# Patient Record
Sex: Female | Born: 1955
Health system: Southern US, Community
[De-identification: ages and names within clinical notes are randomized; demographics above are authoritative.]

## PROBLEM LIST (undated history)

## (undated) DIAGNOSIS — I10 Essential (primary) hypertension: Secondary | ICD-10-CM

## (undated) DIAGNOSIS — I639 Cerebral infarction, unspecified: Secondary | ICD-10-CM

## (undated) HISTORY — DX: Cerebral infarction, unspecified: I63.9

## (undated) HISTORY — DX: Essential (primary) hypertension: I10

---

## 1997-10-17 ENCOUNTER — Ambulatory Visit (HOSPITAL_COMMUNITY): Admission: RE | Admit: 1997-10-17 | Discharge: 1997-10-17 | Payer: Self-pay | Admitting: Obstetrics and Gynecology

## 2000-08-13 ENCOUNTER — Other Ambulatory Visit: Admission: RE | Admit: 2000-08-13 | Discharge: 2000-08-13 | Payer: Self-pay | Admitting: Obstetrics and Gynecology

## 2003-01-17 ENCOUNTER — Other Ambulatory Visit: Admission: RE | Admit: 2003-01-17 | Discharge: 2003-01-17 | Payer: Self-pay | Admitting: Obstetrics and Gynecology

## 2004-12-17 ENCOUNTER — Other Ambulatory Visit: Admission: RE | Admit: 2004-12-17 | Discharge: 2004-12-17 | Payer: Self-pay | Admitting: Obstetrics and Gynecology

## 2007-08-29 ENCOUNTER — Emergency Department (HOSPITAL_COMMUNITY): Admission: EM | Admit: 2007-08-29 | Discharge: 2007-08-29 | Payer: Self-pay | Admitting: Emergency Medicine

## 2010-11-22 LAB — URINALYSIS, ROUTINE W REFLEX MICROSCOPIC
Bilirubin Urine: NEGATIVE
Protein, ur: NEGATIVE
Urobilinogen, UA: 0.2

## 2010-11-22 LAB — DIFFERENTIAL
Basophils Absolute: 0.1
Basophils Relative: 1
Eosinophils Absolute: 0.2
Eosinophils Relative: 2
Lymphocytes Relative: 28
Lymphs Abs: 3.1
Monocytes Absolute: 1.1 — ABNORMAL HIGH
Monocytes Relative: 9
Neutro Abs: 6.7
Neutrophils Relative %: 60

## 2010-11-22 LAB — BASIC METABOLIC PANEL
CO2: 24
Chloride: 107
GFR calc Af Amer: >60
Potassium: 3.7
Sodium: 139

## 2010-11-22 LAB — CBC
HCT: 39.7
Hemoglobin: 13.7
MCHC: 34.4
MCV: 88.8
Platelets: 272
RBC: 4.47
RDW: 13.5
WBC: 11.2 — ABNORMAL HIGH

## 2017-03-11 DIAGNOSIS — H25813 Combined forms of age-related cataract, bilateral: Secondary | ICD-10-CM | POA: Diagnosis not present

## 2017-03-27 DIAGNOSIS — H40013 Open angle with borderline findings, low risk, bilateral: Secondary | ICD-10-CM | POA: Diagnosis not present

## 2017-03-27 DIAGNOSIS — H25813 Combined forms of age-related cataract, bilateral: Secondary | ICD-10-CM | POA: Diagnosis not present

## 2017-04-21 DIAGNOSIS — H2512 Age-related nuclear cataract, left eye: Secondary | ICD-10-CM | POA: Diagnosis not present

## 2017-05-07 DIAGNOSIS — H2512 Age-related nuclear cataract, left eye: Secondary | ICD-10-CM | POA: Diagnosis not present

## 2017-05-07 DIAGNOSIS — H25812 Combined forms of age-related cataract, left eye: Secondary | ICD-10-CM | POA: Diagnosis not present

## 2017-05-14 DIAGNOSIS — H2511 Age-related nuclear cataract, right eye: Secondary | ICD-10-CM | POA: Diagnosis not present

## 2017-05-19 DIAGNOSIS — H2511 Age-related nuclear cataract, right eye: Secondary | ICD-10-CM | POA: Diagnosis not present

## 2017-05-19 DIAGNOSIS — H25811 Combined forms of age-related cataract, right eye: Secondary | ICD-10-CM | POA: Diagnosis not present

## 2017-10-30 DIAGNOSIS — H40013 Open angle with borderline findings, low risk, bilateral: Secondary | ICD-10-CM | POA: Diagnosis not present

## 2017-10-30 DIAGNOSIS — H26492 Other secondary cataract, left eye: Secondary | ICD-10-CM | POA: Diagnosis not present

## 2017-11-08 DIAGNOSIS — I10 Essential (primary) hypertension: Secondary | ICD-10-CM | POA: Diagnosis not present

## 2017-11-08 DIAGNOSIS — G43909 Migraine, unspecified, not intractable, without status migrainosus: Secondary | ICD-10-CM | POA: Diagnosis not present

## 2017-11-09 ENCOUNTER — Emergency Department (HOSPITAL_COMMUNITY)
Admission: EM | Admit: 2017-11-09 | Discharge: 2017-11-09 | Disposition: A | Discharge status: Home / Self Care | Payer: 59 | Attending: Emergency Medicine | Admitting: Emergency Medicine

## 2017-11-09 ENCOUNTER — Emergency Department (HOSPITAL_COMMUNITY): Payer: 59

## 2017-11-09 ENCOUNTER — Encounter (HOSPITAL_COMMUNITY): Payer: Self-pay | Admitting: Emergency Medicine

## 2017-11-09 DIAGNOSIS — R51 Headache: Secondary | ICD-10-CM | POA: Diagnosis not present

## 2017-11-09 DIAGNOSIS — R519 Headache, unspecified: Secondary | ICD-10-CM

## 2017-11-09 DIAGNOSIS — G43909 Migraine, unspecified, not intractable, without status migrainosus: Secondary | ICD-10-CM | POA: Diagnosis not present

## 2017-11-09 DIAGNOSIS — I10 Essential (primary) hypertension: Secondary | ICD-10-CM

## 2017-11-09 DIAGNOSIS — F17210 Nicotine dependence, cigarettes, uncomplicated: Secondary | ICD-10-CM | POA: Diagnosis not present

## 2017-11-09 LAB — CBC WITH DIFFERENTIAL/PLATELET
BASOS PCT: 0 %
Basophils Absolute: 0 10*3/uL (ref 0.0–0.1)
EOS PCT: 0 %
Eosinophils Absolute: 0 10*3/uL (ref 0.0–0.7)
HEMATOCRIT: 47 % — AB (ref 36.0–46.0)
Hemoglobin: 16.2 g/dL — ABNORMAL HIGH (ref 12.0–15.0)
Lymphocytes Relative: 15 %
Lymphs Abs: 2 10*3/uL (ref 0.7–4.0)
MCH: 30.7 pg (ref 26.0–34.0)
MCHC: 34.5 g/dL (ref 30.0–36.0)
MCV: 89 fL (ref 78.0–100.0)
MONO ABS: 1.4 10*3/uL — AB (ref 0.1–1.0)
MONOS PCT: 11 %
Neutro Abs: 10 10*3/uL — ABNORMAL HIGH (ref 1.7–7.7)
Neutrophils Relative %: 74 %
Platelets: 330 10*3/uL (ref 150–400)
RBC: 5.28 MIL/uL — ABNORMAL HIGH (ref 3.87–5.11)
RDW: 13.3 % (ref 11.5–15.5)
WBC: 13.4 10*3/uL — ABNORMAL HIGH (ref 4.0–10.5)

## 2017-11-09 LAB — BASIC METABOLIC PANEL
Anion gap: 13 (ref 5–15)
BUN: 21 mg/dL (ref 8–23)
CALCIUM: 9 mg/dL (ref 8.9–10.3)
CHLORIDE: 99 mmol/L (ref 98–111)
CO2: 29 mmol/L (ref 22–32)
CREATININE: 0.73 mg/dL (ref 0.44–1.00)
GFR calc non Af Amer: >60 mL/min (ref >60)
Glucose, Bld: 85 mg/dL (ref 70–99)
POTASSIUM: 3.1 mmol/L — AB (ref 3.5–5.1)
SODIUM: 141 mmol/L (ref 135–145)

## 2017-11-09 MED ORDER — PROCHLORPERAZINE EDISYLATE 10 MG/2ML IJ SOLN
10.0000 mg | Freq: Once | INTRAMUSCULAR | Status: AC
Start: 2017-11-09 — End: 2017-11-09
  Administered 2017-11-09: 10 mg via INTRAVENOUS
  Filled 2017-11-09: qty 2

## 2017-11-09 MED ORDER — KETOROLAC TROMETHAMINE 30 MG/ML IJ SOLN
15.0000 mg | Freq: Once | INTRAMUSCULAR | Status: AC
  Administered 2017-11-09: 15 mg via INTRAVENOUS
  Filled 2017-11-09: qty 1

## 2017-11-09 MED ORDER — DIPHENHYDRAMINE HCL 50 MG/ML IJ SOLN
25.0000 mg | Freq: Once | INTRAMUSCULAR | Status: AC
  Administered 2017-11-09: 25 mg via INTRAVENOUS
  Filled 2017-11-09: qty 1

## 2017-11-09 MED ORDER — HYDRALAZINE HCL 10 MG PO TABS
10.0000 mg | ORAL_TABLET | Freq: Once | ORAL | Status: AC
  Administered 2017-11-09: 10 mg via ORAL
  Filled 2017-11-09: qty 1

## 2017-11-09 MED ORDER — SODIUM CHLORIDE 0.9 % IV BOLUS
1000.0000 mL | Freq: Once | INTRAVENOUS | Status: AC
  Administered 2017-11-09: 1000 mL via INTRAVENOUS

## 2017-11-09 NOTE — ED Triage Notes (Signed)
Pt c/o headache  / migraine x 4 days. Pt with photosensitivity and pain worsening x 4 days.

## 2017-11-09 NOTE — ED Provider Notes (Signed)
Care assumed from Martinique Russo, PA-C at shift change with re-evaluation pending.   In brief, this patient is a 62 y.o. F who presents for evaluation of right sided headache. Please see note from previous provider for full history/physical.  Patient with negative head CT.  Labs unremarkable.  No evidence for hypertensive emergency at this time.  Patient had improvement in headache with migraine cocktail and had some returning to headache and felt like she cannot leave yet.  Patient was given additional analgesics by previous provider.  PLAN: Plan to dispel home once patient feels better.  Previous provider discussed with attending who recommends PCP follow up to manage her BP. No acute intervention needed here in the ED.   MDM:  Re-evaluation.  Patient states that headache is improved and she is ready to go.  She is still hypertensive.  Will give dose of hydralazine.  Patient states that she is ready to leave and does not want to stay any longer.  Encourage primary care follow-up for evaluation of blood pressure. Patient had ample opportunity for questions and discussion. All patient's questions were answered with full understanding. Strict return precautions discussed. Patient expresses understanding and agreement to plan.    1. Acute nonintractable headache, unspecified headache type       Volanda Napoleon, PA-C 11/09/17 [place], [place], DO 11/09/17 2347

## 2017-11-09 NOTE — Discharge Instructions (Addendum)
Please read instructions below. You can take 600 mg of Advil/ibuprofen every 6 hours as needed for headache. Caffeine may also help your headache. Use your insurance card to find a primary care. Schedule an appointment to manage you blood pressure.  Eat a low salt diet to aid in blood pressure management. Return to the ER for severely worsening headache, vision changes, fever, weakness or numbness, chest pain, or new or concerning symptoms.

## 2017-11-09 NOTE — ED Provider Notes (Addendum)
[facility] DEPT Provider Note   CSN: [id number] Arrival date & time: 11/09/17  0950     History   Chief Complaint Chief Complaint  Patient presents with  . Migraine    HPI TEMA ALIRE is a 62 y.o. female without significant past medical history, presenting to the ED with complaint of right-sided headache that began on Wednesday.  Patient states pain is constant and dull with associated photophobia.  Reports headache gradually worsened since Wednesday, initially accompanied with nausea and vomiting which has since subsided.  She states she has had a headache similar to this in the past that also lasted about 1 week and resolved.  Has been taking Tylenol at home without much relief of symptoms.  Denies vision changes, neck pain or stiffness, fever, unilateral weakness or numbness, facial droop or slurred speech.  No head trauma.  Does not have a PCP and has not had a wellness visit in some time.   The history is provided by the patient.    History reviewed. No pertinent past medical history.  There are no active problems to display for this patient.   History reviewed. No pertinent surgical history.   OB History   None      Home Medications    Prior to Admission medications   Medication Sig Start Date End Date Taking? Authorizing Provider  ibuprofen (ADVIL,MOTRIN) 200 MG tablet Take 600 mg by mouth every 6 (six) hours as needed for headache or moderate pain.   Yes [provider]    Family History History reviewed. No pertinent family history.  Social History Social History   Tobacco Use  . Smoking status: Current Every Day Smoker    Packs/day: 0.50    Types: Cigarettes  . Smokeless tobacco: Never Used  Substance Use Topics  . Alcohol use: Never    Frequency: Never  . Drug use: Never     Allergies   Patient has no known allergies.   Review of Systems Review of Systems  Constitutional: Negative for fever.  Eyes:  Positive for photophobia. Negative for visual disturbance.  Musculoskeletal: Negative for neck pain and neck stiffness.  Neurological: Positive for headaches. Negative for facial asymmetry, speech difficulty, weakness and numbness.  All other systems reviewed and are negative.    Physical Exam Updated Vital Signs BP (!) 206/71   Pulse 61   Temp 97.7 F (36.5 C) (Oral)   Resp 16   SpO2 100%   Physical Exam  Constitutional: She is oriented to person, place, and time. She appears well-developed and well-nourished. No distress.  HENT:  Head: Normocephalic and atraumatic.  Eyes: Conjunctivae are normal.  Neck: Normal range of motion. Neck supple.  No nuchal rigidity  Cardiovascular: Normal rate, regular rhythm and normal heart sounds.  Pulmonary/Chest: Effort normal and breath sounds normal. No respiratory distress.  Abdominal: Soft. Bowel sounds are normal. She exhibits no distension. There is no tenderness.  Neurological: She is alert and oriented to person, place, and time.  Mental Status:  Alert, oriented, thought content appropriate, able to give a coherent history. Speech fluent without evidence of aphasia. Able to follow 2 step commands without difficulty.  Cranial Nerves:  II:  Peripheral visual fields grossly normal, pupils equal, round, reactive to light III,IV, VI: ptosis not present, extra-ocular motions intact bilaterally  V,VII: smile symmetric, facial light touch sensation equal VIII: hearing grossly normal to voice  X: uvula elevates symmetrically  XI: bilateral shoulder shrug symmetric and strong  XII: midline tongue extension without fassiculations Motor:  Normal tone. 5/5 in upper and lower extremities bilaterally including strong and equal grip strength and dorsiflexion/plantar flexion Sensory: Pinprick and light touch normal in all extremities.  Deep Tendon Reflexes: 2+ and symmetric in the biceps and patella Cerebellar: normal finger-to-nose with bilateral  upper extremities No pronator drift Gait: normal gait and balance CV: distal pulses palpable throughout    Skin: Skin is warm.  Psychiatric: She has a normal mood and affect. Her behavior is normal.  Nursing note and vitals reviewed.    ED Treatments / Results  Labs (all labs ordered are listed, but only abnormal results are displayed) Labs Reviewed  BASIC METABOLIC PANEL - Abnormal; Notable for the following components:      Result Value   Potassium 3.1 (*)    All other components within normal limits  CBC WITH DIFFERENTIAL/PLATELET - Abnormal; Notable for the following components:   WBC 13.4 (*)    RBC 5.28 (*)    Hemoglobin 16.2 (*)    HCT 47.0 (*)    Neutro Abs 10.0 (*)    Monocytes Absolute 1.4 (*)    All other components within normal limits    EKG None  Radiology Ct Head Wo Contrast  Result Date: 11/09/2017 CLINICAL DATA:  Headache/migraine for days. Photosensitivity with worsening pain. EXAM: CT HEAD WITHOUT CONTRAST TECHNIQUE: Contiguous axial images were obtained from the base of the skull through the vertex without intravenous contrast. COMPARISON:  None. FINDINGS: Brain: Ventricles are normal in size, ventricles, cisterns and other CSF spaces are normal. There is chronic ischemic microvascular disease. There is an old lacunar infarct over the head of the right caudate nucleus. Small old lacune infarct over the left thalamus. No mass, mass effect, shift of midline structures or acute hemorrhage. No evidence of acute infarction. Vascular: No hyperdense vessel or unexpected calcification. Skull: Normal. Negative for fracture or focal lesion. Sinuses/Orbits: No acute finding. Other: None. IMPRESSION: No acute findings. Chronic ischemic microvascular disease with old small bilateral central lacunar infarcts. Electronically Signed   By: Marin Olp M.D.   On: 11/09/2017 14:31    Procedures Procedures (including critical care time)  Medications Ordered in  ED Medications  sodium chloride 0.9 % bolus 1,000 mL (0 mLs Intravenous Stopped 11/09/17 1359)  ketorolac (TORADOL) 30 MG/ML injection 15 mg (15 mg Intravenous Given 11/09/17 1241)  prochlorperazine (COMPAZINE) injection 10 mg (10 mg Intravenous Given 11/09/17 1241)  diphenhydrAMINE (BENADRYL) injection 25 mg (25 mg Intravenous Given 11/09/17 1241)  ketorolac (TORADOL) 30 MG/ML injection 15 mg (15 mg Intravenous Given 11/09/17 1548)     Initial Impression / Assessment and Plan / ED Course  I have reviewed the triage vital signs and the nursing notes.  Pertinent labs & imaging results that were available during my care of the patient were reviewed by me and considered in my medical decision making (see chart for details).  Patient presenting with headache x4 days, history of a similar episode in the past.  Normal neurologic exam.  Patient is hypertensive on initial evaluation.  Plan to treat headache and repeat vital signs.  If patient remains hypertensive and/or with pain, will consider CT.  On reevaluation, patient reports improvement in pain though patient remains hypertensive with some increase in blood pressure.  For this reason we will order CT scan to rule out acute intracranial cause of headache.  Will also order labs.  Patient discussed with Dr. Eulis Foster, who agrees with plan.  Labs  with mild leukocytosis of 13.4.  Mild hypokalemia.  CT scan is negative for acute pathology though showing some old infarcts. No evidence of hypertensive urgency. Suspect migraine vs cluster HA.   Clinical Course as of Nov 10 1599  Sun Nov 09, 2017  1543 Patient reevaluated and states headache is worsening once again.  Will order another 15 mg of Toradol and trial high flow oxygen as this may be a cluster headache.  Discussed results and encouraged PCP follow-up for blood pressure management.   [JR]    Clinical Course User Index [JR] Conswella Bruney, [place] N, PA-C   Care assumed by [facility] PA at shift change pending  re-evaluation. Likely discharge with instructions to f/u with PCP and modify diet to manage BP.  Pt discussed with Dr. Eulis Foster who guided treatment and agrees with care plan for discharge w PCP follow up.  Discussed results, findings, treatment and follow up. Patient advised of return precautions. Patient verbalized understanding and agreed with plan.  Final Clinical Impressions(s) / ED Diagnoses   Final diagnoses:  Acute nonintractable headache, unspecified headache type    ED Discharge Orders    None       Sabien Umland, [place] N, PA-C 11/09/17 1601    Orvetta Danielski, [place] N, PA-C 11/09/17 1604    Dontrell Stuck, [place] N, [place] 11/09/17 1611    Daleen Bo, MD 11/10/17 1626

## 2017-11-16 DIAGNOSIS — H6980 Other specified disorders of Eustachian tube, unspecified ear: Secondary | ICD-10-CM | POA: Diagnosis not present

## 2017-11-26 ENCOUNTER — Emergency Department (HOSPITAL_COMMUNITY): Payer: 59

## 2017-11-26 ENCOUNTER — Encounter (HOSPITAL_COMMUNITY): Payer: Self-pay

## 2017-11-26 ENCOUNTER — Inpatient Hospital Stay (HOSPITAL_COMMUNITY)
Admission: EM | Admit: 2017-11-26 | Discharge: 2017-12-26 | DRG: 021 | Disposition: A | Discharge status: Rehabilitation Facility | Payer: 59 | Attending: Neurosurgery | Admitting: Neurosurgery

## 2017-11-26 DIAGNOSIS — N390 Urinary tract infection, site not specified: Secondary | ICD-10-CM | POA: Diagnosis not present

## 2017-11-26 DIAGNOSIS — R269 Unspecified abnormalities of gait and mobility: Secondary | ICD-10-CM | POA: Diagnosis not present

## 2017-11-26 DIAGNOSIS — I609 Nontraumatic subarachnoid hemorrhage, unspecified: Secondary | ICD-10-CM

## 2017-11-26 DIAGNOSIS — R339 Retention of urine, unspecified: Secondary | ICD-10-CM | POA: Diagnosis not present

## 2017-11-26 DIAGNOSIS — G911 Obstructive hydrocephalus: Secondary | ICD-10-CM | POA: Diagnosis present

## 2017-11-26 DIAGNOSIS — I608 Other nontraumatic subarachnoid hemorrhage: Secondary | ICD-10-CM | POA: Diagnosis present

## 2017-11-26 DIAGNOSIS — I69319 Unspecified symptoms and signs involving cognitive functions following cerebral infarction: Secondary | ICD-10-CM | POA: Diagnosis not present

## 2017-11-26 DIAGNOSIS — R569 Unspecified convulsions: Secondary | ICD-10-CM | POA: Diagnosis present

## 2017-11-26 DIAGNOSIS — A499 Bacterial infection, unspecified: Secondary | ICD-10-CM | POA: Diagnosis not present

## 2017-11-26 DIAGNOSIS — Z298 Encounter for other specified prophylactic measures: Secondary | ICD-10-CM | POA: Diagnosis not present

## 2017-11-26 DIAGNOSIS — F17210 Nicotine dependence, cigarettes, uncomplicated: Secondary | ICD-10-CM | POA: Diagnosis present

## 2017-11-26 DIAGNOSIS — Z743 Need for continuous supervision: Secondary | ICD-10-CM | POA: Diagnosis not present

## 2017-11-26 DIAGNOSIS — E46 Unspecified protein-calorie malnutrition: Secondary | ICD-10-CM | POA: Diagnosis not present

## 2017-11-26 DIAGNOSIS — R4189 Other symptoms and signs involving cognitive functions and awareness: Secondary | ICD-10-CM | POA: Diagnosis present

## 2017-11-26 DIAGNOSIS — B965 Pseudomonas (aeruginosa) (mallei) (pseudomallei) as the cause of diseases classified elsewhere: Secondary | ICD-10-CM | POA: Diagnosis not present

## 2017-11-26 DIAGNOSIS — R404 Transient alteration of awareness: Secondary | ICD-10-CM | POA: Diagnosis not present

## 2017-11-26 DIAGNOSIS — I607 Nontraumatic subarachnoid hemorrhage from unspecified intracranial artery: Secondary | ICD-10-CM | POA: Diagnosis not present

## 2017-11-26 DIAGNOSIS — R0602 Shortness of breath: Secondary | ICD-10-CM

## 2017-11-26 DIAGNOSIS — R918 Other nonspecific abnormal finding of lung field: Secondary | ICD-10-CM | POA: Diagnosis not present

## 2017-11-26 DIAGNOSIS — I69398 Other sequelae of cerebral infarction: Secondary | ICD-10-CM | POA: Diagnosis not present

## 2017-11-26 DIAGNOSIS — R29810 Facial weakness: Secondary | ICD-10-CM | POA: Diagnosis not present

## 2017-11-26 DIAGNOSIS — R4020 Unspecified coma: Secondary | ICD-10-CM | POA: Diagnosis not present

## 2017-11-26 DIAGNOSIS — I6523 Occlusion and stenosis of bilateral carotid arteries: Secondary | ICD-10-CM | POA: Diagnosis not present

## 2017-11-26 DIAGNOSIS — I69019 Unspecified symptoms and signs involving cognitive functions following nontraumatic subarachnoid hemorrhage: Secondary | ICD-10-CM | POA: Diagnosis not present

## 2017-11-26 DIAGNOSIS — I10 Essential (primary) hypertension: Secondary | ICD-10-CM | POA: Diagnosis present

## 2017-11-26 DIAGNOSIS — I671 Cerebral aneurysm, nonruptured: Secondary | ICD-10-CM | POA: Diagnosis not present

## 2017-11-26 DIAGNOSIS — I629 Nontraumatic intracranial hemorrhage, unspecified: Secondary | ICD-10-CM | POA: Diagnosis not present

## 2017-11-26 DIAGNOSIS — G919 Hydrocephalus, unspecified: Secondary | ICD-10-CM | POA: Diagnosis not present

## 2017-11-26 DIAGNOSIS — Z79899 Other long term (current) drug therapy: Secondary | ICD-10-CM | POA: Diagnosis not present

## 2017-11-26 DIAGNOSIS — G40909 Epilepsy, unspecified, not intractable, without status epilepticus: Secondary | ICD-10-CM | POA: Diagnosis not present

## 2017-11-26 DIAGNOSIS — R509 Fever, unspecified: Secondary | ICD-10-CM

## 2017-11-26 DIAGNOSIS — R197 Diarrhea, unspecified: Secondary | ICD-10-CM | POA: Diagnosis not present

## 2017-11-26 LAB — COMPREHENSIVE METABOLIC PANEL
ALK PHOS: 72 U/L (ref 38–126)
ALT: 15 U/L (ref 0–44)
AST: 24 U/L (ref 15–41)
Albumin: 3.5 g/dL (ref 3.5–5.0)
Anion gap: 14 (ref 5–15)
BUN: 14 mg/dL (ref 8–23)
CALCIUM: 9.2 mg/dL (ref 8.9–10.3)
CO2: 26 mmol/L (ref 22–32)
CREATININE: 0.82 mg/dL (ref 0.44–1.00)
Chloride: 99 mmol/L (ref 98–111)
GFR calc Af Amer: >60 mL/min (ref >60)
GFR calc non Af Amer: >60 mL/min (ref >60)
Glucose, Bld: 149 mg/dL — ABNORMAL HIGH (ref 70–99)
Potassium: 2.9 mmol/L — ABNORMAL LOW (ref 3.5–5.1)
SODIUM: 139 mmol/L (ref 135–145)
Total Bilirubin: 0.9 mg/dL (ref 0.3–1.2)
Total Protein: 7.1 g/dL (ref 6.5–8.1)

## 2017-11-26 LAB — ACETAMINOPHEN LEVEL

## 2017-11-26 LAB — CBC WITH DIFFERENTIAL/PLATELET
Abs Immature Granulocytes: 0.1 10*3/uL (ref 0.0–0.1)
BASOS ABS: 0 10*3/uL (ref 0.0–0.1)
BASOS PCT: 0 %
EOS PCT: 0 %
Eosinophils Absolute: 0 10*3/uL (ref 0.0–0.7)
HCT: 46.4 % — ABNORMAL HIGH (ref 36.0–46.0)
HEMOGLOBIN: 15.2 g/dL — AB (ref 12.0–15.0)
Immature Granulocytes: 1 %
LYMPHS ABS: 2 10*3/uL (ref 0.7–4.0)
LYMPHS PCT: 11 %
MCH: 29.9 pg (ref 26.0–34.0)
MCHC: 32.8 g/dL (ref 30.0–36.0)
MCV: 91.3 fL (ref 78.0–100.0)
MONO ABS: 1.3 10*3/uL — AB (ref 0.1–1.0)
MONOS PCT: 7 %
Neutro Abs: 14.7 10*3/uL — ABNORMAL HIGH (ref 1.7–7.7)
Neutrophils Relative %: 81 %
PLATELETS: 408 10*3/uL — AB (ref 150–400)
RBC: 5.08 MIL/uL (ref 3.87–5.11)
RDW: 13.3 % (ref 11.5–15.5)
WBC: 18.3 10*3/uL — AB (ref 4.0–10.5)

## 2017-11-26 LAB — APTT: APTT: 26 s (ref 24–36)

## 2017-11-26 LAB — SALICYLATE LEVEL: Salicylate Lvl: <7 mg/dL (ref 2.8–30.0)

## 2017-11-26 LAB — PROTIME-INR
INR: 1.02
PROTHROMBIN TIME: 13.3 s (ref 11.4–15.2)

## 2017-11-26 LAB — ETHANOL: Alcohol, Ethyl (B): <10 mg/dL (ref <10)

## 2017-11-26 LAB — MRSA PCR SCREENING: MRSA by PCR: NEGATIVE

## 2017-11-26 MED ORDER — IOPAMIDOL (ISOVUE-370) INJECTION 76%
INTRAVENOUS | Status: AC
  Filled 2017-11-26: qty 50

## 2017-11-26 MED ORDER — NICARDIPINE HCL IN NACL 20-0.86 MG/200ML-% IV SOLN
3.0000 mg/h | Freq: Once | INTRAVENOUS | Status: AC
  Administered 2017-11-26: 5 mg/h via INTRAVENOUS
  Filled 2017-11-26: qty 200

## 2017-11-26 MED ORDER — SODIUM CHLORIDE 0.9 % IV SOLN
INTRAVENOUS | Status: DC | PRN
  Administered 2017-11-28 (×2): 1000 mL via INTRAVENOUS
  Administered 2017-11-29: 500 mL via INTRAVENOUS
  Administered 2017-12-24: 250 mL via INTRAVENOUS

## 2017-11-26 MED ORDER — NIMODIPINE 30 MG PO CAPS
60.0000 mg | ORAL_CAPSULE | ORAL | Status: DC
  Administered 2017-11-26 – 2017-11-27 (×3): 60 mg via ORAL
  Filled 2017-11-26 (×6): qty 2

## 2017-11-26 MED ORDER — DOCUSATE SODIUM 100 MG PO CAPS
100.0000 mg | ORAL_CAPSULE | Freq: Two times a day (BID) | ORAL | Status: DC
  Administered 2017-11-28 – 2017-12-18 (×4): 100 mg via ORAL
  Filled 2017-11-26 (×10): qty 1

## 2017-11-26 MED ORDER — HYDROMORPHONE HCL 1 MG/ML IJ SOLN
0.5000 mg | INTRAMUSCULAR | Status: DC | PRN
  Administered 2017-11-26 – 2017-11-27 (×2): 0.5 mg via INTRAVENOUS
  Filled 2017-11-26 (×2): qty 1

## 2017-11-26 MED ORDER — LEVETIRACETAM IN NACL 500 MG/100ML IV SOLN
500.0000 mg | Freq: Two times a day (BID) | INTRAVENOUS | Status: DC
  Administered 2017-11-26 – 2017-11-29 (×6): 500 mg via INTRAVENOUS
  Filled 2017-11-26 (×8): qty 100

## 2017-11-26 MED ORDER — PANTOPRAZOLE SODIUM 40 MG PO TBEC
40.0000 mg | DELAYED_RELEASE_TABLET | Freq: Every day | ORAL | Status: DC
  Administered 2017-11-28 – 2017-12-25 (×26): 40 mg via ORAL
  Filled 2017-11-26 (×28): qty 1

## 2017-11-26 MED ORDER — ONDANSETRON 4 MG PO TBDP: 4.0000 mg | ORAL_TABLET | Freq: Four times a day (QID) | ORAL | Status: DC | PRN

## 2017-11-26 MED ORDER — NIMODIPINE 60 MG/20ML PO SOLN
60.0000 mg | ORAL | Status: DC
  Administered 2017-11-28 (×2): 60 mg
  Filled 2017-11-26 (×2): qty 20

## 2017-11-26 MED ORDER — PANTOPRAZOLE SODIUM 40 MG PO PACK
40.0000 mg | PACK | Freq: Every day | ORAL | Status: DC
  Administered 2017-12-09 – 2017-12-22 (×2): 40 mg
  Filled 2017-11-26 (×6): qty 20

## 2017-11-26 MED ORDER — ONDANSETRON HCL 4 MG/2ML IJ SOLN
4.0000 mg | Freq: Four times a day (QID) | INTRAMUSCULAR | Status: DC | PRN
  Administered 2017-11-27 – 2017-12-26 (×9): 4 mg via INTRAVENOUS
  Filled 2017-11-26 (×9): qty 2

## 2017-11-26 MED ORDER — CLEVIDIPINE BUTYRATE 0.5 MG/ML IV EMUL
0.0000 mg/h | INTRAVENOUS | Status: DC
  Administered 2017-11-26: 1 mg/h via INTRAVENOUS
  Administered 2017-11-27 (×2): 2 mg/h via INTRAVENOUS
  Administered 2017-11-27: 10 mg/h via INTRAVENOUS
  Administered 2017-11-27: 2 mg/h via INTRAVENOUS
  Administered 2017-11-27: 8 mg/h via INTRAVENOUS
  Administered 2017-11-27: 12 mg/h via INTRAVENOUS
  Administered 2017-11-28: 6 mg/h via INTRAVENOUS
  Administered 2017-11-28: 10 mg/h via INTRAVENOUS
  Administered 2017-11-28: 4 mg/h via INTRAVENOUS
  Administered 2017-11-29: 1 mg/h via INTRAVENOUS
  Administered 2017-11-29: 6 mg/h via INTRAVENOUS
  Administered 2017-11-30: 2.5 mg/h via INTRAVENOUS
  Filled 2017-11-26 (×4): qty 50
  Filled 2017-11-26: qty 100
  Filled 2017-11-26 (×8): qty 50

## 2017-11-26 MED ORDER — STROKE: EARLY STAGES OF RECOVERY BOOK
Freq: Once | Status: DC
  Filled 2017-11-26: qty 1

## 2017-11-26 MED ORDER — ACETAMINOPHEN 160 MG/5ML PO SOLN
650.0000 mg | ORAL | Status: DC | PRN
  Administered 2017-12-12: 650 mg
  Filled 2017-11-26: qty 20.3

## 2017-11-26 MED ORDER — TRAMADOL HCL 50 MG PO TABS
50.0000 mg | ORAL_TABLET | Freq: Four times a day (QID) | ORAL | Status: DC | PRN
  Filled 2017-11-26: qty 1

## 2017-11-26 MED ORDER — IOPAMIDOL (ISOVUE-370) INJECTION 76%
50.0000 mL | Freq: Once | INTRAVENOUS | Status: AC | PRN
  Administered 2017-11-26: 50 mL via INTRAVENOUS

## 2017-11-26 MED ORDER — ACETAMINOPHEN 650 MG RE SUPP
650.0000 mg | RECTAL | Status: DC | PRN
  Administered 2017-11-28 – 2017-11-29 (×2): 650 mg via RECTAL
  Filled 2017-11-26 (×2): qty 1

## 2017-11-26 MED ORDER — SODIUM CHLORIDE 0.9 % IV SOLN
INTRAVENOUS | Status: DC
  Administered 2017-11-26 – 2017-11-28 (×5): via INTRAVENOUS

## 2017-11-26 MED ORDER — ACETAMINOPHEN 325 MG PO TABS
650.0000 mg | ORAL_TABLET | ORAL | Status: DC | PRN
  Administered 2017-11-29 – 2017-12-19 (×6): 650 mg via ORAL
  Filled 2017-11-26 (×6): qty 2

## 2017-11-26 NOTE — ED Triage Notes (Signed)
[facility] reports pt from home, seen at [facility] on 9/15 for headaches and N, - CT Later seen at [facility] dx with possible ear infection (pt said no, husband said yes) Pt does not take meds.  Today she had a full body seizure lasting 30 sec. She was confused when ems arrived.  Manual bp 230/110 Auto 197/89 hr 78 rr 20  cbg 129 Right sided headache 2/10  93% RA  Placed on 2lpm 97-98% Pt given 4mg  Zofran, vomited in ems bay

## 2017-11-26 NOTE — ED Notes (Signed)
Pt was asked if she could provide a UA; states she still does not have to go at this time but verbalized understanding that she will use call light to notify RN/tech when she needs to go.

## 2017-11-26 NOTE — ED Notes (Signed)
Patient returned from CT

## 2017-11-26 NOTE — ED Notes (Signed)
Patient transported to CT 

## 2017-11-26 NOTE — ED Notes (Signed)
Pt reminded for the need for urine.

## 2017-11-26 NOTE — ED Notes (Signed)
2 Gold, 1 Light Green, 1 Lav, 1 Blue collected upon arrival; sent to Main Lab to be held.  1 Dark Green put on rocker in [employer].

## 2017-11-26 NOTE — ED Provider Notes (Signed)
[facility]ERGENCY DEPARTMENT Provider Note   CSN: [id number] Arrival date & time: 11/26/17  1115     History   Chief Complaint Chief Complaint  Patient presents with  . Seizures    HPI Sheryl James is a 62 y.o. female.  62 year old female with no significant past mental history who presents with seizure.  Husband states that the patient has been dealing with persistent headaches over the past few weeks.  She has had associated nausea, no vomiting.  She was evaluated at [facility] a few weeks ago and had negative work-up, was discharged home.  At that time she was noted to be severely hypertensive and was instructed to follow-up with a PCP.  Husband states that she has not yet gone to a PCP for the hypertension.  She went to [facility] walk[facility]in [facility] due to persistent headache associated with dizziness.  There they felt that her right ear looked suspicious and started antibiotic treatment for possible otitis media.  She did not have any ear pain.  Husband notes that she is continued to have headaches.  Today just prior to arrival, he brought her a biscuit to eat and when she took a few bites of it, she became unresponsive and started jerking for 20 to 30 seconds in an apparent seizure.  She was sleepy and altered afterwards.  EMS noted confusion on their arrival.  She has felt hot recently, mild occasional cough and congestion.   LEVEL 5 CAVEAT DUE TO AMS  The history is provided by the spouse and the patient.  Seizures      History reviewed. No pertinent past medical history.  Patient Active Problem List   Diagnosis Date Noted  . Subarachnoid hemorrhage due to ruptured aneurysm ([place]) 11/26/2017    History reviewed. No pertinent surgical history.   OB History   None      Home Medications    Prior to Admission medications   Medication Sig Start Date End Date Taking? Authorizing Provider  ibuprofen (ADVIL,MOTRIN) 200 MG tablet Take 600 mg by mouth every  6 (six) hours as needed for headache or moderate pain.   Yes [provider]    Family History History reviewed. No pertinent family history.  Social History Social History   Tobacco Use  . Smoking status: Current Every Day Smoker    Packs/day: 0.50    Types: Cigarettes  . Smokeless tobacco: Never Used  Substance Use Topics  . Alcohol use: Never    Frequency: Never  . Drug use: Never     Allergies   Patient has no known allergies.   Review of Systems Review of Systems  Unable to perform ROS: Mental status change  Neurological: Positive for seizures.   Physical Exam Updated Vital Signs BP (!) 154/45   Pulse 79   Resp 20   Ht 5\' 2"  (1.575 m)   Wt 68 kg   SpO2 96%   BMI 27.44 kg/m   Physical Exam  Constitutional: She is oriented to person, place, and time. She appears well-developed and well-nourished. No distress.  Awake, alert  HENT:  Head: Normocephalic and atraumatic.  Right Ear: Tympanic membrane and ear canal normal.  Left Ear: Tympanic membrane and ear canal normal.  Eyes: Pupils are equal, round, and reactive to light. Conjunctivae and EOM are normal.  Neck: Neck supple.  Cardiovascular: Normal rate, regular rhythm and normal heart sounds.  No murmur heard. Pulmonary/Chest: Effort normal and breath sounds normal. No respiratory  distress.  Abdominal: Soft. Bowel sounds are normal. She exhibits no distension. There is no tenderness.  Musculoskeletal: She exhibits no edema.  Neurological: She is alert and oriented to person, place, and time. She has normal reflexes. No cranial nerve deficit. She exhibits normal muscle tone.  Sleepy but arousable, Fluent speech, normal finger-to-nose testing, negative pronator drift, no clonus 5/5 strength and normal sensation x all 4 extremities  Skin: Skin is warm and dry.  Psychiatric: She has a normal mood and affect. Judgment and thought content normal.  Nursing note and vitals reviewed.    ED Treatments  / Results  Labs (all labs ordered are listed, but only abnormal results are displayed) Labs Reviewed  COMPREHENSIVE METABOLIC PANEL - Abnormal; Notable for the following components:      Result Value   Potassium 2.9 (*)    Glucose, Bld 149 (*)    All other components within normal limits  CBC WITH DIFFERENTIAL/PLATELET - Abnormal; Notable for the following components:   WBC 18.3 (*)    Hemoglobin 15.2 (*)    HCT 46.4 (*)    Platelets 408 (*)    Neutro Abs 14.7 (*)    Monocytes Absolute 1.3 (*)    All other components within normal limits  ACETAMINOPHEN LEVEL - Abnormal; Notable for the following components:   Acetaminophen (Tylenol), Serum <10 (*)    All other components within normal limits  ETHANOL  SALICYLATE LEVEL  URINALYSIS, ROUTINE W REFLEX MICROSCOPIC  HIV ANTIBODY (ROUTINE TESTING W REFLEX)  PROTIME-INR  APTT  RAPID URINE DRUG SCREEN, HOSP PERFORMED    EKG EKG Interpretation  Date/Time:  Wednesday November 26 2017 11:24:17 EDT Ventricular Rate:  80 PR Interval:    QRS Duration: 95 QT Interval:  419 QTC Calculation: 484 R Axis:   69 Text Interpretation:  Sinus rhythm Consider right atrial enlargement Borderline repolarization abnormality Baseline wander in lead(s) V2 No previous ECGs available Confirmed by Theotis Burrow [phone]) on 11/26/2017 12:39:24 PM Also confirmed by Theotis Burrow [phone]), editor Philomena Doheny [phone])  on 11/26/2017 3:29:57 PM   Radiology Ct Angio Head W Or Wo Contrast  Result Date: 11/26/2017 CLINICAL DATA:  62 year old female with subarachnoid hemorrhage suspicious for aneurysmal rupture, presenting with seizure. EXAM: CT ANGIOGRAPHY HEAD AND NECK TECHNIQUE: Multidetector CT imaging of the head and neck was performed using the standard protocol during bolus administration of intravenous contrast. Multiplanar CT image reconstructions and MIPs were obtained to evaluate the vascular anatomy. Carotid stenosis measurements (when applicable) are  obtained utilizing NASCET criteria, using the distal internal carotid diameter as the denominator. CONTRAST:  46mL ISOVUE-370 IOPAMIDOL (ISOVUE-370) INJECTION 76% COMPARISON:  Head CT without contrast 1258 hours today and 11/09/2017. FINDINGS: CTA NECK Skeleton: No acute osseous abnormality identified. Upper chest: Centrilobular emphysema. Otherwise negative lung apices. No superior mediastinal lymphadenopathy. Other neck: Small nodular hyperenhancing area of soft tissue along the posterior inferior left parotid space encompassing 13 x 6 x 10 millimeters (AP by transverse by CC). It is unclear whether this may be associated with a small nearby exterior venous branch seen on series 9, image 143. The remainder of the left parotid space appears normal. Otherwise negative neck soft tissues. Aortic arch: Good contrast timing at the aortic arch. Three vessel arch configuration. Mild arch and proximal great vessel atherosclerosis. Right carotid system: No right CCA stenosis despite mild plaque. Calcified plaque at the right carotid bifurcation. Less than 50 % stenosis with respect to the distal vessel of the proximal right vertebral  artery. A beaded appearance of the cervical right ICA at the C1-C2 level is felt to be artifact from dental hardware streak (series 9, image 67). Left carotid system: Mild left CCA atherosclerosis with no stenosis. Calcified plaque at the left carotid bifurcation resulting in less than 50 % stenosis with respect to the distal vessel. Mildly tortuous cervical left ICA. Vertebral arteries: No proximal right subclavian artery stenosis despite soft and calcified plaque. Normal right vertebral artery origin. The right vertebral is non dominant and fairly diminutive in the V1 and V3 segments. No right vertebral artery stenosis to the skull base. No proximal left subclavian artery stenosis despite mild plaque. Mild soft plaque at the left vertebral artery origin without stenosis. Dominant left  vertebral artery has a fairly normal caliber and is without stenosis to the skull base. CTA HEAD Posterior circulation: The left vertebral artery is dominant but both distal vertebral arteries appear somewhat diminutive. No distal vertebral stenosis. Normal PICA origins. Patent vertebrobasilar junction. Diminutive and irregular but patent basilar artery. See series 8 images 108-110. Patent AICA and SCA origins. It appears the SCA is are duplicated bilaterally. At the basilar tip there is a large irregular balloon shaped aneurysm encompassing 8 x 8 x 11 millimeters (AP by transverse by CC) directed superiorly and anteriorly toward the left. See series 8, image 107. Intracranial arterial timing is suboptimal with prominent venous contamination. There is a fetal type left PCA origin suspected which passes adjacent to but may not communicate with the basilar tip and aneurysm (series 8, image 109). The right PCA appears larger and better enhancing and does appear to arise along the right lateral neck of the aneurysm (image 108). No right posterior communicating artery identified. Anterior circulation: Suboptimal arterial contrast timing with prominent venous contamination, including in the cavernous sinuses. Both ICA siphons are patent with calcified plaque. There is up to moderate stenosis of the left supraclinoid segment where bulky calcified plaque is noted. Right siphon stenosis appears to be mild. Both carotid termini are patent. Patent MCA origins. The ACAs appear diminutive and are difficult to delineate from adjacent venous structures. Likewise, there is venous contamination about the MCA M1 segments. Both MCA bifurcations appear patent. No major anterior circulation branch occlusion is evident. Venous sinuses: Patent with prominent intracranial venous contamination which seems unusual given the good contrast timing in the upper chest. No abnormal arterial to dural venous communication is apparent. Anatomic  variants: Dominant left vertebral artery. Fetal type left PCA origin suspected. Delayed phase: Stable subarachnoid and intraventricular hemorrhage since 1258 hours. The 4th ventricle is relatively spared. Mild generalized ventriculomegaly, most apparent at the temporal horns. No midline shift. Stable gray-white matter differentiation throughout the brain. Review of the MIP images confirms the above findings IMPRESSION: 1. 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 These findings were preliminarily reviewed in person with Dr. Ashok Pall on 11/26/2017 at 1436 hours. 2. Note: - diminutive and irregular Basilar Artery which may represent a combination of basal spasm and congenitally small vessel. - Right PCA appears to arise along the lateral aneurysm neck (series 8, image 108). - Suspected fetal type Left PCA origin. - dominant Left Vertebral Artery. 3. Intracranial venous contamination which seems out of proportion to the contrast timing in the chest, but no abnormal A-V communication or fistula is identified. 4. Mild subclavian and vertebral artery atherosclerosis with no significant stenosis. 5. Bilateral carotid bifurcation and siphon atherosclerosis, with up to moderate left supraclinoid  ICA stenosis but no other hemodynamically significant carotid stenosis. 6. Intracranial subarachnoid hemorrhage with mild ventriculomegaly appears stable since 1258 hours today. 7. Small nonspecific left parotid space nodule which can be followed with routine outpatient Neck CT (IV contrast preferred). Electronically Signed   By: Genevie Ann M.D.   On: 11/26/2017 15:03   Ct Head Wo Contrast  Result Date: 11/26/2017 CLINICAL DATA:  Seizure EXAM: CT HEAD WITHOUT CONTRAST TECHNIQUE: Contiguous axial images were obtained from the base of the skull through the vertex without intravenous contrast. COMPARISON:  November 09, 2017 FINDINGS: Brain: The ventricles are mildly prominent in  a generalized manner. Sulci appear stable without dilatation. There is subarachnoid hemorrhage in the medial frontal lobe regions bilaterally extending to the level of the rostrum of the corpus callosum. Hemorrhage layers in each lateral ventricular region. Subarachnoid hemorrhage also tracks along the middle cerebral artery distributions into the medial temporal lobe regions. There is no well-defined mass. There is no extra-axial fluid collection or midline shift. There is evidence of a prior infarct involving the head of the caudate nucleus on the right. There is evidence of a prior small infarct in the left thalamus. There is patchy periventricular small vessel disease in the centra semiovale bilaterally. Vascular: No hyperdense vessels are evident. There is calcification in each carotid siphon region. Skull: The bony calvarium appears intact. Sinuses/Orbits: There is mucosal thickening involving several ethmoid air cells. There is mucosal thickening in the inferior right maxillary antrum. Other paranasal sinuses are clear. Orbits appear symmetric bilaterally. Other: Mastoid air cells are clear. IMPRESSION: Subarachnoid hemorrhage tracking in the midline of each frontal lobe as well as along the middle cerebral artery distribution tracts bilaterally. There is mild hemorrhage layering in each lateral ventricle. This appearance is concerning for possible aneurysm rupture at the level of the anterior communicating artery. Ventricles are prominent but stable.  Sulci are not dilated. Prior small infarcts in the head of the caudate nucleus on the right and in the left thalamus. Periventricular small vessel disease noted. No acute infarct evident. Critical Value/emergent results were called by telephone at the time of interpretation on 11/26/2017 at 1:10 pm to Dr. Theotis Burrow , who verbally acknowledged these results. Electronically Signed   By: Lowella Grip III M.D.   On: 11/26/2017 13:11   Ct Angio Neck W  And/or Wo Contrast  Result Date: 11/26/2017 CLINICAL DATA:  62 year old female with subarachnoid hemorrhage suspicious for aneurysmal rupture, presenting with seizure. EXAM: CT ANGIOGRAPHY HEAD AND NECK TECHNIQUE: Multidetector CT imaging of the head and neck was performed using the standard protocol during bolus administration of intravenous contrast. Multiplanar CT image reconstructions and MIPs were obtained to evaluate the vascular anatomy. Carotid stenosis measurements (when applicable) are obtained utilizing NASCET criteria, using the distal internal carotid diameter as the denominator. CONTRAST:  17mL ISOVUE-370 IOPAMIDOL (ISOVUE-370) INJECTION 76% COMPARISON:  Head CT without contrast 1258 hours today and 11/09/2017. FINDINGS: CTA NECK Skeleton: No acute osseous abnormality identified. Upper chest: Centrilobular emphysema. Otherwise negative lung apices. No superior mediastinal lymphadenopathy. Other neck: Small nodular hyperenhancing area of soft tissue along the posterior inferior left parotid space encompassing 13 x 6 x 10 millimeters (AP by transverse by CC). It is unclear whether this may be associated with a small nearby exterior venous branch seen on series 9, image 143. The remainder of the left parotid space appears normal. Otherwise negative neck soft tissues. Aortic arch: Good contrast timing at the aortic arch. Three vessel arch configuration. Mild  arch and proximal great vessel atherosclerosis. Right carotid system: No right CCA stenosis despite mild plaque. Calcified plaque at the right carotid bifurcation. Less than 50 % stenosis with respect to the distal vessel of the proximal right vertebral artery. A beaded appearance of the cervical right ICA at the C1-C2 level is felt to be artifact from dental hardware streak (series 9, image 67). Left carotid system: Mild left CCA atherosclerosis with no stenosis. Calcified plaque at the left carotid bifurcation resulting in less than 50 % stenosis  with respect to the distal vessel. Mildly tortuous cervical left ICA. Vertebral arteries: No proximal right subclavian artery stenosis despite soft and calcified plaque. Normal right vertebral artery origin. The right vertebral is non dominant and fairly diminutive in the V1 and V3 segments. No right vertebral artery stenosis to the skull base. No proximal left subclavian artery stenosis despite mild plaque. Mild soft plaque at the left vertebral artery origin without stenosis. Dominant left vertebral artery has a fairly normal caliber and is without stenosis to the skull base. CTA HEAD Posterior circulation: The left vertebral artery is dominant but both distal vertebral arteries appear somewhat diminutive. No distal vertebral stenosis. Normal PICA origins. Patent vertebrobasilar junction. Diminutive and irregular but patent basilar artery. See series 8 images 108-110. Patent AICA and SCA origins. It appears the SCA is are duplicated bilaterally. At the basilar tip there is a large irregular balloon shaped aneurysm encompassing 8 x 8 x 11 millimeters (AP by transverse by CC) directed superiorly and anteriorly toward the left. See series 8, image 107. Intracranial arterial timing is suboptimal with prominent venous contamination. There is a fetal type left PCA origin suspected which passes adjacent to but may not communicate with the basilar tip and aneurysm (series 8, image 109). The right PCA appears larger and better enhancing and does appear to arise along the right lateral neck of the aneurysm (image 108). No right posterior communicating artery identified. Anterior circulation: Suboptimal arterial contrast timing with prominent venous contamination, including in the cavernous sinuses. Both ICA siphons are patent with calcified plaque. There is up to moderate stenosis of the left supraclinoid segment where bulky calcified plaque is noted. Right siphon stenosis appears to be mild. Both carotid termini are  patent. Patent MCA origins. The ACAs appear diminutive and are difficult to delineate from adjacent venous structures. Likewise, there is venous contamination about the MCA M1 segments. Both MCA bifurcations appear patent. No major anterior circulation branch occlusion is evident. Venous sinuses: Patent with prominent intracranial venous contamination which seems unusual given the good contrast timing in the upper chest. No abnormal arterial to dural venous communication is apparent. Anatomic variants: Dominant left vertebral artery. Fetal type left PCA origin suspected. Delayed phase: Stable subarachnoid and intraventricular hemorrhage since 1258 hours. The 4th ventricle is relatively spared. Mild generalized ventriculomegaly, most apparent at the temporal horns. No midline shift. Stable gray-white matter differentiation throughout the brain. Review of the MIP images confirms the above findings IMPRESSION: 1. 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 These findings were preliminarily reviewed in person with Dr. Ashok Pall on 11/26/2017 at 1436 hours. 2. Note: - diminutive and irregular Basilar Artery which may represent a combination of basal spasm and congenitally small vessel. - Right PCA appears to arise along the lateral aneurysm neck (series 8, image 108). - Suspected fetal type Left PCA origin. - dominant Left Vertebral Artery. 3. Intracranial venous contamination which seems out of proportion to  the contrast timing in the chest, but no abnormal A-V communication or fistula is identified. 4. Mild subclavian and vertebral artery atherosclerosis with no significant stenosis. 5. Bilateral carotid bifurcation and siphon atherosclerosis, with up to moderate left supraclinoid ICA stenosis but no other hemodynamically significant carotid stenosis. 6. Intracranial subarachnoid hemorrhage with mild ventriculomegaly appears stable since 1258 hours today. 7. Small  nonspecific left parotid space nodule which can be followed with routine outpatient Neck CT (IV contrast preferred). Electronically Signed   By: Genevie Ann M.D.   On: 11/26/2017 15:03   Dg Chest Port 1 View  Result Date: 11/26/2017 CLINICAL DATA:  Altered mental status, seizure, headache. EXAM: PORTABLE CHEST 1 VIEW COMPARISON:  None. FINDINGS: The heart size and mediastinal contours are within normal limits. Both lungs are clear. The visualized skeletal structures are unremarkable. IMPRESSION: No active disease. Electronically Signed   By: Franki Cabot M.D.   On: 11/26/2017 13:47    Procedures .Critical Care Performed by: Sharlett Iles, MD Authorized by: Sharlett Iles, MD   Critical care provider statement:    Critical care time (minutes):  30   Critical care time was exclusive of:  Separately billable procedures and treating other patients   Critical care was necessary to treat or prevent imminent or life-threatening deterioration of the following conditions:  CNS failure or compromise   Critical care was time spent personally by me on the following activities:  Development of treatment plan with patient or surrogate, discussions with consultants, examination of patient, ordering and performing treatments and interventions, ordering and review of radiographic studies, ordering and review of laboratory studies, re-evaluation of patient's condition and review of old charts   (including critical care time)  Medications Ordered in ED Medications  iopamidol (ISOVUE-370) 76 % injection (has no administration in time range)   stroke: mapping our early stages of recovery book (has no administration in time range)  0.9 %  sodium chloride infusion (has no administration in time range)  acetaminophen (TYLENOL) tablet 650 mg (has no administration in time range)    Or  acetaminophen (TYLENOL) solution 650 mg (has no administration in time range)    Or  acetaminophen (TYLENOL)  suppository 650 mg (has no administration in time range)  docusate sodium (COLACE) capsule 100 mg (has no administration in time range)  ondansetron (ZOFRAN-ODT) disintegrating tablet 4 mg (has no administration in time range)    Or  ondansetron (ZOFRAN) injection 4 mg (has no administration in time range)  niMODipine (NIMOTOP) capsule 60 mg (has no administration in time range)    Or  niMODipine (NYMALIZE) 60 MG/20ML oral solution 60 mg (has no administration in time range)  pantoprazole (PROTONIX) EC tablet 40 mg (has no administration in time range)    Or  pantoprazole sodium (PROTONIX) 40 mg/20 mL oral suspension 40 mg (has no administration in time range)  traMADol (ULTRAM) tablet 50-100 mg (has no administration in time range)  HYDROmorphone (DILAUDID) injection 0.5 mg (has no administration in time range)  clevidipine (CLEVIPREX) infusion 0.5 mg/mL (has no administration in time range)  levETIRAcetam (KEPPRA) IVPB 500 mg/100 mL premix (has no administration in time range)  nicardipine (CARDENE) 20mg  in 0.86% saline 248ml IV infusion (0.1 mg/ml) (10 mg/hr Intravenous Transfusing/Transfer 11/26/17 1602)  iopamidol (ISOVUE-370) 76 % injection 50 mL (50 mLs Intravenous Contrast Given 11/26/17 1439)     Initial Impression / Assessment and Plan / ED Course  I have reviewed the triage vital signs and the  nursing notes.  Pertinent labs & imaging results that were available during my care of the patient were reviewed by me and considered in my medical decision making (see chart for details).     Sleepy but neurologically intact on exam. VS notable for HTN 073X systolic.  No focal neurologic deficits.  DDx is broad and includes hypertensive emergency, intracranial bleed, infectious process although no signs of meningoencephalitis.   Lab work overall unremarkable.  Head CT shows subarachnoid hemorrhage and frontal lobe concerning for possible aneurysm rupture of ACA.  I immediately contacted  neurosurgery and discussed with Dr. Christella Noa.  Started nicardipine drip with goal systolic BP of 106.  I have ordered CTA of head and neck.  Patient will be admitted to the ICU for further care.  Family updated on diagnosis and plan. Final Clinical Impressions(s) / ED Diagnoses   Final diagnoses:  SAH (subarachnoid hemorrhage) [facility])    ED Discharge Orders    None       Ivon Roedel, Wenda Overland, MD 11/26/17 1702

## 2017-11-26 NOTE — H&P (Signed)
Sheryl James is an 62 y.o. female.   Chief Complaint: Headache, seizure HPI: whom first went to the [facility] ED on 9/14 with a persistent headache. A head CT was performed which did not show a hemorrhage of any type. She returned to the ED and was told she had a middle ear problem which accounted for dizziness complaints. Her headache did not improve during this time. She had a seizure this morning and her spouse called 911. Had emesis while in the ED, her neurological exam reported to have improved during transport. Head CT today showed subarachnoid blood.  History reviewed. No pertinent past medical history.  History reviewed. No pertinent surgical history.  History reviewed. No pertinent family history. Social History:  reports that she has been smoking cigarettes. She has been smoking about 0.50 packs per day. She has never used smokeless tobacco. She reports that she does not drink alcohol or use drugs.  Allergies: No Known Allergies   (Not in a hospital admission)  Results for orders placed or performed during the hospital encounter of 11/26/17 (from the past 48 hour(s))  Ethanol     Status: None   Collection Time: 11/26/17 11:23 AM  Result Value Ref Range   Alcohol, Ethyl (B) <10 <10 mg/dL    Comment: (NOTE) Lowest detectable limit for serum alcohol is 10 mg/dL. For medical purposes only. Performed at [facility], [place] [address] [place] [address], [place], [place] [address]   Acetaminophen level     Status: Abnormal   Collection Time: 11/26/17 11:23 AM  Result Value Ref Range   Acetaminophen (Tylenol), Serum <10 (L) 10 - 30 ug/mL    Comment: (NOTE) Therapeutic concentrations vary significantly. A range of 10-30 ug/mL  may be an effective concentration for many patients. However, some  are best treated at concentrations outside of this range. Acetaminophen concentrations >150 ug/mL at 4 hours after ingestion  and >50 ug/mL at 12 hours after ingestion are often associated with   toxic reactions. Performed at [facility], [place] [address] [place], [place] [address]   Salicylate level     Status: None   Collection Time: 11/26/17 11:23 AM  Result Value Ref Range   Salicylate Lvl <8.6 2.8 - 30.0 mg/dL    Comment: Performed at [facility] [address] [place], [place] [address]  Comprehensive metabolic panel     Status: Abnormal   Collection Time: 11/26/17 12:10 PM  Result Value Ref Range   Sodium 139 135 - 145 mmol/L   Potassium 2.9 (L) 3.5 - 5.1 mmol/L   Chloride 99 98 - 111 mmol/L   CO2 26 22 - 32 mmol/L   Glucose, Bld 149 (H) 70 - 99 mg/dL   BUN 14 8 - 23 mg/dL   Creatinine, Ser 0.82 0.44 - 1.00 mg/dL   Calcium 9.2 8.9 - 10.3 mg/dL   Total Protein 7.1 6.5 - 8.1 g/dL   Albumin 3.5 3.5 - 5.0 g/dL   AST 24 15 - 41 U/L   ALT 15 0 - 44 U/L   Alkaline Phosphatase 72 38 - 126 U/L   Total Bilirubin 0.9 0.3 - 1.2 mg/dL   GFR calc non Af Amer >60 >60 mL/min   GFR calc Af Amer >60 >60 mL/min    Comment: (NOTE) The eGFR has been calculated using the CKD EPI equation. This calculation has not been validated in all clinical situations. eGFR's persistently <60 mL/min signify possible Chronic Kidney Disease.  Anion gap 14 5 - 15    Comment: Performed at [facility] [address], [place], [place] [address]  CBC with Differential     Status: Abnormal   Collection Time: 11/26/17 12:10 PM  Result Value Ref Range   WBC 18.3 (H) 4.0 - 10.5 K/uL   RBC 5.08 3.87 - 5.11 MIL/uL   Hemoglobin 15.2 (H) 12.0 - 15.0 g/dL   HCT 46.4 (H) 36.0 - 46.0 %   MCV 91.3 78.0 - 100.0 fL   MCH 29.9 26.0 - 34.0 pg   MCHC 32.8 30.0 - 36.0 g/dL   RDW 13.3 11.5 - 15.5 %   Platelets 408 (H) 150 - 400 K/uL   Neutrophils Relative % 81 %   Neutro Abs 14.7 (H) 1.7 - 7.7 K/uL   Lymphocytes Relative 11 %   Lymphs Abs 2.0 0.7 - 4.0 K/uL   Monocytes Relative 7 %   Monocytes Absolute 1.3 (H) 0.1 - 1.0 K/uL   Eosinophils Relative 0 %   Eosinophils Absolute 0.0 0.0  - 0.7 K/uL   Basophils Relative 0 %   Basophils Absolute 0.0 0.0 - 0.1 K/uL   Immature Granulocytes 1 %   Abs Immature Granulocytes 0.1 0.0 - 0.1 K/uL    Comment: Performed at [facility], [place] [address] [place], [place] [address]   Ct Angio Head W Or Wo Contrast  Result Date: 11/26/2017 CLINICAL DATA:  62 year old female with subarachnoid hemorrhage suspicious for aneurysmal rupture, presenting with seizure. EXAM: CT ANGIOGRAPHY HEAD AND NECK TECHNIQUE: Multidetector CT imaging of the head and neck was performed using the standard protocol during bolus administration of intravenous contrast. Multiplanar CT image reconstructions and MIPs were obtained to evaluate the vascular anatomy. Carotid stenosis measurements (when applicable) are obtained utilizing NASCET criteria, using the distal internal carotid diameter as the denominator. CONTRAST:  37m ISOVUE-370 IOPAMIDOL (ISOVUE-370) INJECTION 76% COMPARISON:  Head CT without contrast 1258 hours today and 11/09/2017. FINDINGS: CTA NECK Skeleton: No acute osseous abnormality identified. Upper chest: Centrilobular emphysema. Otherwise negative lung apices. No superior mediastinal lymphadenopathy. Other neck: Small nodular hyperenhancing area of soft tissue along the posterior inferior left parotid space encompassing 13 x 6 x 10 millimeters (AP by transverse by CC). It is unclear whether this may be associated with a small nearby exterior venous branch seen on series 9, image 143. The remainder of the left parotid space appears normal. Otherwise negative neck soft tissues. Aortic arch: Good contrast timing at the aortic arch. Three vessel arch configuration. Mild arch and proximal great vessel atherosclerosis. Right carotid system: No right CCA stenosis despite mild plaque. Calcified plaque at the right carotid bifurcation. Less than 50 % stenosis with respect to the distal vessel of the proximal right vertebral artery. A beaded appearance of the  cervical right ICA at the C1-C2 level is felt to be artifact from dental hardware streak (series 9, image 67). Left carotid system: Mild left CCA atherosclerosis with no stenosis. Calcified plaque at the left carotid bifurcation resulting in less than 50 % stenosis with respect to the distal vessel. Mildly tortuous cervical left ICA. Vertebral arteries: No proximal right subclavian artery stenosis despite soft and calcified plaque. Normal right vertebral artery origin. The right vertebral is non dominant and fairly diminutive in the V1 and V3 segments. No right vertebral artery stenosis to the skull base. No proximal left subclavian artery stenosis despite mild plaque. Mild soft plaque at the left vertebral artery origin without stenosis.  Dominant left vertebral artery has a fairly normal caliber and is without stenosis to the skull base. CTA HEAD Posterior circulation: The left vertebral artery is dominant but both distal vertebral arteries appear somewhat diminutive. No distal vertebral stenosis. Normal PICA origins. Patent vertebrobasilar junction. Diminutive and irregular but patent basilar artery. See series 8 images 108-110. Patent AICA and SCA origins. It appears the SCA is are duplicated bilaterally. At the basilar tip there is a large irregular balloon shaped aneurysm encompassing 8 x 8 x 11 millimeters (AP by transverse by CC) directed superiorly and anteriorly toward the left. See series 8, image 107. Intracranial arterial timing is suboptimal with prominent venous contamination. There is a fetal type left PCA origin suspected which passes adjacent to but may not communicate with the basilar tip and aneurysm (series 8, image 109). The right PCA appears larger and better enhancing and does appear to arise along the right lateral neck of the aneurysm (image 108). No right posterior communicating artery identified. Anterior circulation: Suboptimal arterial contrast timing with prominent venous contamination,  including in the cavernous sinuses. Both ICA siphons are patent with calcified plaque. There is up to moderate stenosis of the left supraclinoid segment where bulky calcified plaque is noted. Right siphon stenosis appears to be mild. Both carotid termini are patent. Patent MCA origins. The ACAs appear diminutive and are difficult to delineate from adjacent venous structures. Likewise, there is venous contamination about the MCA M1 segments. Both MCA bifurcations appear patent. No major anterior circulation branch occlusion is evident. Venous sinuses: Patent with prominent intracranial venous contamination which seems unusual given the good contrast timing in the upper chest. No abnormal arterial to dural venous communication is apparent. Anatomic variants: Dominant left vertebral artery. Fetal type left PCA origin suspected. Delayed phase: Stable subarachnoid and intraventricular hemorrhage since 1258 hours. The 4th ventricle is relatively spared. Mild generalized ventriculomegaly, most apparent at the temporal horns. No midline shift. Stable gray-white matter differentiation throughout the brain. Review of the MIP images confirms the above findings IMPRESSION: 1. 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 These findings were preliminarily reviewed in person with Dr. Ashok Pall on 11/26/2017 at 1436 hours. 2. Note: - diminutive and irregular Basilar Artery which may represent a combination of basal spasm and congenitally small vessel. - Right PCA appears to arise along the lateral aneurysm neck (series 8, image 108). - Suspected fetal type Left PCA origin. - dominant Left Vertebral Artery. 3. Intracranial venous contamination which seems out of proportion to the contrast timing in the chest, but no abnormal A-V communication or fistula is identified. 4. Mild subclavian and vertebral artery atherosclerosis with no significant stenosis. 5. Bilateral carotid  bifurcation and siphon atherosclerosis, with up to moderate left supraclinoid ICA stenosis but no other hemodynamically significant carotid stenosis. 6. Intracranial subarachnoid hemorrhage with mild ventriculomegaly appears stable since 1258 hours today. 7. Small nonspecific left parotid space nodule which can be followed with routine outpatient Neck CT (IV contrast preferred). Electronically Signed   By: Genevie Ann M.D.   On: 11/26/2017 15:03   Ct Head Wo Contrast  Result Date: 11/26/2017 CLINICAL DATA:  Seizure EXAM: CT HEAD WITHOUT CONTRAST TECHNIQUE: Contiguous axial images were obtained from the base of the skull through the vertex without intravenous contrast. COMPARISON:  November 09, 2017 FINDINGS: Brain: The ventricles are mildly prominent in a generalized manner. Sulci appear stable without dilatation. There is subarachnoid hemorrhage in the medial frontal lobe regions  bilaterally extending to the level of the rostrum of the corpus callosum. Hemorrhage layers in each lateral ventricular region. Subarachnoid hemorrhage also tracks along the middle cerebral artery distributions into the medial temporal lobe regions. There is no well-defined mass. There is no extra-axial fluid collection or midline shift. There is evidence of a prior infarct involving the head of the caudate nucleus on the right. There is evidence of a prior small infarct in the left thalamus. There is patchy periventricular small vessel disease in the centra semiovale bilaterally. Vascular: No hyperdense vessels are evident. There is calcification in each carotid siphon region. Skull: The bony calvarium appears intact. Sinuses/Orbits: There is mucosal thickening involving several ethmoid air cells. There is mucosal thickening in the inferior right maxillary antrum. Other paranasal sinuses are clear. Orbits appear symmetric bilaterally. Other: Mastoid air cells are clear. IMPRESSION: Subarachnoid hemorrhage tracking in the midline of each  frontal lobe as well as along the middle cerebral artery distribution tracts bilaterally. There is mild hemorrhage layering in each lateral ventricle. This appearance is concerning for possible aneurysm rupture at the level of the anterior communicating artery. Ventricles are prominent but stable.  Sulci are not dilated. Prior small infarcts in the head of the caudate nucleus on the right and in the left thalamus. Periventricular small vessel disease noted. No acute infarct evident. Critical Value/emergent results were called by telephone at the time of interpretation on 11/26/2017 at 1:10 pm to Dr. Theotis Burrow , who verbally acknowledged these results. Electronically Signed   By: Lowella Grip III M.D.   On: 11/26/2017 13:11   Ct Angio Neck W And/or Wo Contrast  Result Date: 11/26/2017 CLINICAL DATA:  62 year old female with subarachnoid hemorrhage suspicious for aneurysmal rupture, presenting with seizure. EXAM: CT ANGIOGRAPHY HEAD AND NECK TECHNIQUE: Multidetector CT imaging of the head and neck was performed using the standard protocol during bolus administration of intravenous contrast. Multiplanar CT image reconstructions and MIPs were obtained to evaluate the vascular anatomy. Carotid stenosis measurements (when applicable) are obtained utilizing NASCET criteria, using the distal internal carotid diameter as the denominator. CONTRAST:  25m ISOVUE-370 IOPAMIDOL (ISOVUE-370) INJECTION 76% COMPARISON:  Head CT without contrast 1258 hours today and 11/09/2017. FINDINGS: CTA NECK Skeleton: No acute osseous abnormality identified. Upper chest: Centrilobular emphysema. Otherwise negative lung apices. No superior mediastinal lymphadenopathy. Other neck: Small nodular hyperenhancing area of soft tissue along the posterior inferior left parotid space encompassing 13 x 6 x 10 millimeters (AP by transverse by CC). It is unclear whether this may be associated with a small nearby exterior venous branch seen on  series 9, image 143. The remainder of the left parotid space appears normal. Otherwise negative neck soft tissues. Aortic arch: Good contrast timing at the aortic arch. Three vessel arch configuration. Mild arch and proximal great vessel atherosclerosis. Right carotid system: No right CCA stenosis despite mild plaque. Calcified plaque at the right carotid bifurcation. Less than 50 % stenosis with respect to the distal vessel of the proximal right vertebral artery. A beaded appearance of the cervical right ICA at the C1-C2 level is felt to be artifact from dental hardware streak (series 9, image 67). Left carotid system: Mild left CCA atherosclerosis with no stenosis. Calcified plaque at the left carotid bifurcation resulting in less than 50 % stenosis with respect to the distal vessel. Mildly tortuous cervical left ICA. Vertebral arteries: No proximal right subclavian artery stenosis despite soft and calcified plaque. Normal right vertebral artery origin. The right vertebral is non  dominant and fairly diminutive in the V1 and V3 segments. No right vertebral artery stenosis to the skull base. No proximal left subclavian artery stenosis despite mild plaque. Mild soft plaque at the left vertebral artery origin without stenosis. Dominant left vertebral artery has a fairly normal caliber and is without stenosis to the skull base. CTA HEAD Posterior circulation: The left vertebral artery is dominant but both distal vertebral arteries appear somewhat diminutive. No distal vertebral stenosis. Normal PICA origins. Patent vertebrobasilar junction. Diminutive and irregular but patent basilar artery. See series 8 images 108-110. Patent AICA and SCA origins. It appears the SCA is are duplicated bilaterally. At the basilar tip there is a large irregular balloon shaped aneurysm encompassing 8 x 8 x 11 millimeters (AP by transverse by CC) directed superiorly and anteriorly toward the left. See series 8, image 107. Intracranial  arterial timing is suboptimal with prominent venous contamination. There is a fetal type left PCA origin suspected which passes adjacent to but may not communicate with the basilar tip and aneurysm (series 8, image 109). The right PCA appears larger and better enhancing and does appear to arise along the right lateral neck of the aneurysm (image 108). No right posterior communicating artery identified. Anterior circulation: Suboptimal arterial contrast timing with prominent venous contamination, including in the cavernous sinuses. Both ICA siphons are patent with calcified plaque. There is up to moderate stenosis of the left supraclinoid segment where bulky calcified plaque is noted. Right siphon stenosis appears to be mild. Both carotid termini are patent. Patent MCA origins. The ACAs appear diminutive and are difficult to delineate from adjacent venous structures. Likewise, there is venous contamination about the MCA M1 segments. Both MCA bifurcations appear patent. No major anterior circulation branch occlusion is evident. Venous sinuses: Patent with prominent intracranial venous contamination which seems unusual given the good contrast timing in the upper chest. No abnormal arterial to dural venous communication is apparent. Anatomic variants: Dominant left vertebral artery. Fetal type left PCA origin suspected. Delayed phase: Stable subarachnoid and intraventricular hemorrhage since 1258 hours. The 4th ventricle is relatively spared. Mild generalized ventriculomegaly, most apparent at the temporal horns. No midline shift. Stable gray-white matter differentiation throughout the brain. Review of the MIP images confirms the above findings IMPRESSION: 1. 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 These findings were preliminarily reviewed in person with Dr. Ashok Pall on 11/26/2017 at 1436 hours. 2. Note: - diminutive and irregular Basilar Artery which may  represent a combination of basal spasm and congenitally small vessel. - Right PCA appears to arise along the lateral aneurysm neck (series 8, image 108). - Suspected fetal type Left PCA origin. - dominant Left Vertebral Artery. 3. Intracranial venous contamination which seems out of proportion to the contrast timing in the chest, but no abnormal A-V communication or fistula is identified. 4. Mild subclavian and vertebral artery atherosclerosis with no significant stenosis. 5. Bilateral carotid bifurcation and siphon atherosclerosis, with up to moderate left supraclinoid ICA stenosis but no other hemodynamically significant carotid stenosis. 6. Intracranial subarachnoid hemorrhage with mild ventriculomegaly appears stable since 1258 hours today. 7. Small nonspecific left parotid space nodule which can be followed with routine outpatient Neck CT (IV contrast preferred). Electronically Signed   By: Genevie Ann M.D.   On: 11/26/2017 15:03   Dg Chest Port 1 View  Result Date: 11/26/2017 CLINICAL DATA:  Altered mental status, seizure, headache. EXAM: PORTABLE CHEST 1 VIEW COMPARISON:  None. FINDINGS: The  heart size and mediastinal contours are within normal limits. Both lungs are clear. The visualized skeletal structures are unremarkable. IMPRESSION: No active disease. Electronically Signed   By: Franki Cabot M.D.   On: 11/26/2017 13:47    Review of Systems  HENT: Negative.   Eyes: Negative.   Respiratory: Negative.   Cardiovascular: Negative.   Gastrointestinal: Negative.   Genitourinary: Negative.   Musculoskeletal: Negative.   Skin: Negative.   Neurological: Positive for seizures and headaches.  Endo/Heme/Allergies: Negative.   Psychiatric/Behavioral: Negative.     Blood pressure (!) 185/73, pulse 73, resp. rate (!) 22, height _0  (1.575 m), weight 68 kg, SpO2 (!) 88 %. Physical Exam  Constitutional: She appears well-developed and well-nourished. She appears lethargic.  HENT:  Head:  Normocephalic and atraumatic.  Right Ear: External ear normal.  Left Ear: External ear normal.  Mouth/Throat: Oropharynx is clear and moist.  Very poor dentition  Eyes: Pupils are equal, round, and reactive to light. Conjunctivae and EOM are normal. Right eye exhibits no discharge. Left eye exhibits no discharge.  Neck: Normal range of motion. Neck supple.  Cardiovascular: Normal rate, regular rhythm, normal heart sounds and intact distal pulses.  Respiratory: Effort normal and breath sounds normal.  GI: Soft. Bowel sounds are normal.  Neurological: She has normal strength and normal reflexes. She appears lethargic. No cranial nerve deficit or sensory deficit. She displays no seizure activity. She displays no Babinski's sign on the right side. She displays no Babinski's sign on the left side.  Reflex Scores:      Tricep reflexes are 2+ on the right side and 2+ on the left side.      Bicep reflexes are 2+ on the right side and 2+ on the left side.      Brachioradialis reflexes are 2+ on the right side and 2+ on the left side.      Patellar reflexes are 2+ on the right side and 2+ on the left side.      Achilles reflexes are 2+ on the right side and 2+ on the left side. Knew the president, not the year, did know location No drift Gait not assessed Strength is normal in the upper and lower extremities  Skin: Skin is warm and dry.     Assessment/Plan SHIZUE KASEMAN is a 62 y.o. female whom had a seizure this morning. She was found on CT to have a subarachnoid hemorrhage. CTA shows a basilar tip aneurysm and hydrocephalus.She will be admitted and have a ventricular catheter placed. Anticipate endovascular treatment for the aneurysm.   Yamir Carignan L, MD 11/26/2017, 3:17 PM

## 2017-11-27 ENCOUNTER — Encounter (HOSPITAL_COMMUNITY)
Admission: EM | Disposition: A | Discharge status: Rehabilitation Facility | Payer: Self-pay | Source: Home / Self Care | Attending: Neurosurgery

## 2017-11-27 ENCOUNTER — Inpatient Hospital Stay (HOSPITAL_COMMUNITY): Payer: 59 | Admitting: Anesthesiology

## 2017-11-27 ENCOUNTER — Inpatient Hospital Stay (HOSPITAL_COMMUNITY): Payer: 59

## 2017-11-27 ENCOUNTER — Other Ambulatory Visit (HOSPITAL_COMMUNITY): Payer: 59

## 2017-11-27 HISTORY — PX: IR TRANSCATH/EMBOLIZ: IMG695

## 2017-11-27 HISTORY — PX: IR ANGIO INTRA EXTRACRAN SEL INTERNAL CAROTID BILAT MOD SED: IMG5363

## 2017-11-27 HISTORY — PX: IR ANGIO VERTEBRAL SEL VERTEBRAL UNI L MOD SED: IMG5367

## 2017-11-27 HISTORY — PX: IR ANGIOGRAM FOLLOW UP STUDY: IMG697

## 2017-11-27 HISTORY — PX: RADIOLOGY WITH ANESTHESIA: SHX6223

## 2017-11-27 HISTORY — PX: IR US GUIDE VASC ACCESS RIGHT: IMG2390

## 2017-11-27 LAB — URINALYSIS, ROUTINE W REFLEX MICROSCOPIC
BACTERIA UA: NONE SEEN
Bilirubin Urine: NEGATIVE
Glucose, UA: 50 mg/dL — AB
KETONES UR: 20 mg/dL — AB
LEUKOCYTES UA: NEGATIVE
Nitrite: NEGATIVE
PH: 6 (ref 5.0–8.0)
Protein, ur: >=300 mg/dL — AB
Specific Gravity, Urine: 1.028 (ref 1.005–1.030)

## 2017-11-27 LAB — BASIC METABOLIC PANEL
ANION GAP: 8 (ref 5–15)
BUN: 13 mg/dL (ref 8–23)
CO2: 27 mmol/L (ref 22–32)
Calcium: 8.7 mg/dL — ABNORMAL LOW (ref 8.9–10.3)
Chloride: 105 mmol/L (ref 98–111)
Creatinine, Ser: 0.53 mg/dL (ref 0.44–1.00)
GFR calc Af Amer: >60 mL/min (ref >60)
GFR calc non Af Amer: >60 mL/min (ref >60)
GLUCOSE: 108 mg/dL — AB (ref 70–99)
POTASSIUM: 2.5 mmol/L — AB (ref 3.5–5.1)
Sodium: 140 mmol/L (ref 135–145)

## 2017-11-27 LAB — HIV ANTIBODY (ROUTINE TESTING W REFLEX): HIV Screen 4th Generation wRfx: NONREACTIVE

## 2017-11-27 LAB — RAPID URINE DRUG SCREEN, HOSP PERFORMED
Amphetamines: NOT DETECTED
BARBITURATES: NOT DETECTED
Benzodiazepines: NOT DETECTED
COCAINE: NOT DETECTED
Opiates: NOT DETECTED
TETRAHYDROCANNABINOL: NOT DETECTED

## 2017-11-27 SURGERY — IR WITH ANESTHESIA
Anesthesia: General

## 2017-11-27 MED ORDER — FENTANYL CITRATE (PF) 100 MCG/2ML IJ SOLN
INTRAMUSCULAR | Status: AC
  Filled 2017-11-27: qty 2

## 2017-11-27 MED ORDER — CEFAZOLIN SODIUM-DEXTROSE 1-4 GM/50ML-% IV SOLN
1.0000 g | Freq: Three times a day (TID) | INTRAVENOUS | Status: DC
  Administered 2017-11-27 – 2017-12-24 (×81): 1 g via INTRAVENOUS
  Filled 2017-11-27 (×86): qty 50

## 2017-11-27 MED ORDER — ROCURONIUM BROMIDE 50 MG/5ML IV SOSY
PREFILLED_SYRINGE | INTRAVENOUS | Status: DC | PRN
  Administered 2017-11-27: 50 mg via INTRAVENOUS

## 2017-11-27 MED ORDER — IOPAMIDOL (ISOVUE-300) INJECTION 61%
INTRAVENOUS | Status: AC
  Administered 2017-11-27: 10 mL via INTRA_ARTERIAL
  Filled 2017-11-27: qty 100

## 2017-11-27 MED ORDER — ONDANSETRON HCL 4 MG/2ML IJ SOLN
INTRAMUSCULAR | Status: DC | PRN
  Administered 2017-11-27: 4 mg via INTRAVENOUS

## 2017-11-27 MED ORDER — POTASSIUM CHLORIDE 10 MEQ/100ML IV SOLN
10.0000 meq | INTRAVENOUS | Status: AC
  Administered 2017-11-27 (×2): 10 meq via INTRAVENOUS
  Filled 2017-11-27 (×2): qty 100

## 2017-11-27 MED ORDER — SODIUM CHLORIDE 0.9 % IV SOLN
INTRAVENOUS | Status: AC
  Filled 2017-11-27: qty 100

## 2017-11-27 MED ORDER — IOHEXOL 300 MG/ML  SOLN: 100.0000 mL | Freq: Once | INTRAMUSCULAR | Status: DC | PRN

## 2017-11-27 MED ORDER — LABETALOL HCL 5 MG/ML IV SOLN
INTRAVENOUS | Status: DC | PRN
  Administered 2017-11-27: 10 mg via INTRAVENOUS

## 2017-11-27 MED ORDER — SODIUM CHLORIDE 0.9 % IV SOLN
INTRAVENOUS | Status: DC | PRN
  Administered 2017-11-27: .1 ug/kg/min via INTRAVENOUS

## 2017-11-27 MED ORDER — MIDAZOLAM HCL 2 MG/2ML IJ SOLN
INTRAMUSCULAR | Status: AC
  Filled 2017-11-27: qty 2

## 2017-11-27 MED ORDER — EPHEDRINE SULFATE-NACL 50-0.9 MG/10ML-% IV SOSY
PREFILLED_SYRINGE | INTRAVENOUS | Status: DC | PRN
  Administered 2017-11-27: 15 mg via INTRAVENOUS

## 2017-11-27 MED ORDER — MIDAZOLAM HCL 2 MG/2ML IJ SOLN
2.0000 mg | Freq: Once | INTRAMUSCULAR | Status: AC
  Administered 2017-11-27: 2 mg via INTRAVENOUS

## 2017-11-27 MED ORDER — IOPAMIDOL (ISOVUE-300) INJECTION 61%
INTRAVENOUS | Status: AC
  Administered 2017-11-27: 100 mL via INTRA_ARTERIAL
  Filled 2017-11-27: qty 100

## 2017-11-27 MED ORDER — PHENYLEPHRINE 40 MCG/ML (10ML) SYRINGE FOR IV PUSH (FOR BLOOD PRESSURE SUPPORT)
PREFILLED_SYRINGE | INTRAVENOUS | Status: DC | PRN
  Administered 2017-11-27 (×2): 120 ug via INTRAVENOUS

## 2017-11-27 MED ORDER — LIDOCAINE 2% (20 MG/ML) 5 ML SYRINGE
INTRAMUSCULAR | Status: DC | PRN
  Administered 2017-11-27: 60 mg via INTRAVENOUS

## 2017-11-27 MED ORDER — FENTANYL CITRATE (PF) 100 MCG/2ML IJ SOLN
25.0000 ug | INTRAMUSCULAR | Status: DC | PRN
  Administered 2017-11-27: 25 ug via INTRAVENOUS

## 2017-11-27 MED ORDER — PROPOFOL 10 MG/ML IV BOLUS
INTRAVENOUS | Status: DC | PRN
  Administered 2017-11-27: 120 mg via INTRAVENOUS

## 2017-11-27 MED ORDER — SUCCINYLCHOLINE CHLORIDE 20 MG/ML IJ SOLN
INTRAMUSCULAR | Status: DC | PRN
  Administered 2017-11-27: 100 mg via INTRAVENOUS

## 2017-11-27 MED ORDER — SODIUM CHLORIDE 0.9 % IV SOLN
INTRAVENOUS | Status: DC | PRN
  Administered 2017-11-27: 50 ug/min via INTRAVENOUS

## 2017-11-27 MED ORDER — ORAL CARE MOUTH RINSE
15.0000 mL | Freq: Two times a day (BID) | OROMUCOSAL | Status: DC
  Administered 2017-11-27 – 2017-12-14 (×31): 15 mL via OROMUCOSAL

## 2017-11-27 MED ORDER — SUGAMMADEX SODIUM 200 MG/2ML IV SOLN
INTRAVENOUS | Status: DC | PRN
  Administered 2017-11-27: 200 mg via INTRAVENOUS

## 2017-11-27 MED ORDER — ESMOLOL HCL 100 MG/10ML IV SOLN
INTRAVENOUS | Status: DC | PRN
  Administered 2017-11-27: 30 mg via INTRAVENOUS

## 2017-11-27 MED ORDER — REMIFENTANIL HCL 2 MG IV SOLR
INTRAVENOUS | Status: AC
  Filled 2017-11-27: qty 2000

## 2017-11-27 NOTE — Progress Notes (Signed)
OT Cancellation Note  Patient Details Name: LUNDON ROSIER MRN: [id number] DOB: April 04, 1955   Cancelled Treatment:    Reason Eval/Treat Not Completed: Patient at procedure or test/ unavailable;Patient not medically ready;Active bedrest order.   Will reattempt as appropriate.  Lucille Passy, OTR/L Acute Rehabilitation Services Pager [phone] Office [phone]   Lucille Passy M 11/27/2017, 10:25 AM

## 2017-11-27 NOTE — Brief Op Note (Signed)
  NEUROSURGERY BRIEF OPERATIVE NOTE   PREOP DX: Subarachnoid Hemorrhage  POSTOP DX: Same  PROCEDURE: Diagnostic cerebral angiogram, coil embolization of basilar artery aneurysm  SURGEON: Dr. Consuella Lose, MD  ANESTHESIA: GETA  EBL: Minimal  SPECIMENS: None  COMPLICATIONS: None  CONDITION: Stable to recovery  FINDINGS: 1. ~10.7 x 6 x 52mm basilar apex aneurysm, successfully coiled. No aneurysm residual post-embolization 2. No significant vasospasm seen. 3. No other aneurysms, AVM, or fistulas seen.

## 2017-11-27 NOTE — Transfer of Care (Signed)
Immediate Anesthesia Transfer of Care Note  Patient: Sheryl James  Procedure(s) Performed: IR WITH ANESTHESIA (N/A )  Patient Location: PACU  Anesthesia Type:General  Level of Consciousness: drowsy and patient cooperative  Airway & Oxygen Therapy: Patient Spontanous Breathing and Patient connected to face mask oxygen  Post-op Assessment: Report given to RN and Post -op Vital signs reviewed and stable  Post vital signs: Reviewed and stable  Last Vitals:  Vitals Value Taken Time  BP 111/60 11/27/2017  7:33 PM  Temp    Pulse 72 11/27/2017  7:37 PM  Resp 21 11/27/2017  7:37 PM  SpO2 95 % 11/27/2017  7:37 PM  Vitals shown include unvalidated device data.  Last Pain:  Vitals:   11/27/17 1600  TempSrc:   PainSc: 0-No pain         Complications: No apparent anesthesia complications

## 2017-11-27 NOTE — Progress Notes (Signed)
CRITICAL VALUE ALERT  Critical Value:  Potassium- 2.5  Date & Time Notied:  11/27/17 @ 1155  Provider Notified: K. Christella Noa, MD  Orders Received/Actions taken: MD informed, states "we will address in the OR.

## 2017-11-27 NOTE — Sedation Documentation (Signed)
Report give to Estill Bamberg, RN,.

## 2017-11-27 NOTE — Progress Notes (Signed)
  NEUROSURGERY PROGRESS NOTE   Pts history reviewed. Has had unchanged relatively severe HA over about 2 weeks. Had SZ yesterday and returned to ED. EVD placed overnight.  Does not have regular medical care. No known family hx of aneurysms, smokes 1ppd.  EXAM:  BP (!) 118/56   Pulse 66   Temp 98.1 F (36.7 C) (Oral)   Resp 14   Ht 5\' 2"  (1.575 m)   Wt 68 kg   SpO2 97%   BMI 27.44 kg/m   Somnolent but arouses Speech fluent CN grossly intact  5/5 BUE/BLE  EVD in place  CT reviewed, demonstrates basal SAH with IVH, ventriculomegaly. CTA demonstrates superiorly/anteriorly projecting basilar apex aneurysm, irregularity of the vertebral arteries may suggest spasm  IMPRESSION:  62 y.o. female with aneurysmal SAH, from presentation could have had sentinel hemorrhage 2 weeks ago, unclear if CTA shows spasm suggesting more chronic SAH v artifactual underestimation of lumenal size.  PLAN: - Will proceed with diagnostic cerebral angiogram, possible coiling if spasm allows.  I did review the imaging findings with the patients family. The angiogram and coiling procedure was reviewed. Risks were discussed including stroke or hemorrhage. Risk of rebleed without aneurysm treatment was discussed. All their questions were answered, and family provided consent to proceed as above.

## 2017-11-27 NOTE — Progress Notes (Signed)
Patient has been lethargic all night after receiving 0.5mg  dilaudid at 1800. She would still awaken, follow commands, and answer a few questions before falling back asleep. At [place] noticed patient developed new right facial droop, R arm drift, and less responsive to questions.  Patient also would not willingly lift either leg against gravity but did withdraw and say ouch to pain. Intermittently speech would be dysarthric. Notified Dr Christella Noa and received order for  stat head CT. Dr Christella Noa notified that CT completed.    Annissa Andreoni, [place], RN

## 2017-11-27 NOTE — Anesthesia Procedure Notes (Signed)
Arterial Line Insertion Start/End10/04/2017 10:50 AM, 11/27/2017 11:10 AM Performed by: Verdie Drown, CRNA, CRNA  Patient location: Pre-op. Preanesthetic checklist: patient identified, IV checked, site marked, risks and benefits discussed, surgical consent, monitors and equipment checked, pre-op evaluation, timeout performed and anesthesia consent Lidocaine 1% used for infiltration radial was placed Catheter size: 20 G Hand hygiene performed , maximum sterile barriers used  and Seldinger technique used Allen's test indicative of satisfactory collateral circulation Attempts: 1 Procedure performed without using ultrasound guided technique. Following insertion, dressing applied. Patient tolerated the procedure well with no immediate complications.

## 2017-11-27 NOTE — Progress Notes (Signed)
SLP Cancellation Note  Patient Details Name: Sheryl James MRN: [id number] DOB: 1955/04/27   Cancelled treatment:       Reason Eval/Treat Not Completed: Patient at procedure or test/unavailable. Will f/u for swallow eval and cog assessment 10/4 in am.    Royal Beirne, Katherene Ponto 11/27/2017, 11:08 AM

## 2017-11-27 NOTE — Anesthesia Preprocedure Evaluation (Addendum)
Anesthesia Evaluation  Patient identified by MRN, date of birth, ID band Patient awake and Patient confused    Reviewed: Allergy & Precautions, NPO status , Patient's Chart, lab work & pertinent test results  Airway Mallampati: II  TM Distance: >3 FB Neck ROM: Full    Dental  (+) Dental Advisory Given   Pulmonary Current Smoker,    breath sounds clear to auscultation       Cardiovascular negative cardio ROS   Rhythm:Regular Rate:Normal     Neuro/Psych SAH    GI/Hepatic negative GI ROS, Neg liver ROS,   Endo/Other  negative endocrine ROS  Renal/GU negative Renal ROS     Musculoskeletal   Abdominal   Peds  Hematology negative hematology ROS (+)   Anesthesia Other Findings   Reproductive/Obstetrics                            Lab Results  Component Value Date   WBC 18.3 (H) 11/26/2017   HGB 15.2 (H) 11/26/2017   HCT 46.4 (H) 11/26/2017   MCV 91.3 11/26/2017   PLT 408 (H) 11/26/2017   Lab Results  Component Value Date   CREATININE 0.53 11/27/2017   BUN 13 11/27/2017   NA 140 11/27/2017   K 2.5 (LL) 11/27/2017   CL 105 11/27/2017   CO2 27 11/27/2017    Anesthesia Physical Anesthesia Plan  ASA: IV and emergent  Anesthesia Plan: General   Post-op Pain Management:    Induction: Intravenous  PONV Risk Score and Plan: Ondansetron and Dexamethasone  Airway Management Planned: Oral ETT  Additional Equipment: Arterial line  Intra-op Plan:   Post-operative Plan: Extubation in OR and Possible Post-op intubation/ventilation  Informed Consent: I have reviewed the patients History and Physical, chart, labs and discussed the procedure including the risks, benefits and alternatives for the proposed anesthesia with the patient or authorized representative who has indicated his/her understanding and acceptance.   Dental advisory given  Plan Discussed with: CRNA  Anesthesia Plan  Comments:        Anesthesia Quick Evaluation

## 2017-11-27 NOTE — Progress Notes (Signed)
PT Cancellation Note  Patient Details Name: Sheryl James MRN: [id number] DOB: Jan 24, 1956   Cancelled Treatment:    Reason Eval/Treat Not Completed: Patient at procedure or test/unavailable;Patient not medically ready.  Active bedrest, to IR and will likely not be ready for therapy today.  Will see tomorrow as able. 11/27/2017  Sheryl James, PT Acute Rehabilitation Services [phone]  (pager) [phone]  (office)   Sheryl James 11/27/2017, 10:25 AM

## 2017-11-27 NOTE — Progress Notes (Signed)
PT Cancellation Note  Patient Details Name: Sheryl James MRN: [id number] DOB: 04/16/1955   Cancelled Treatment:        [name] [name] 11/27/2017, 10:23 AM

## 2017-11-27 NOTE — Anesthesia Procedure Notes (Signed)
Procedure Name: Intubation Date/Time: 11/27/2017 5:24 PM Performed by: Eligha Bridegroom, CRNA Pre-anesthesia Checklist: Patient identified, Emergency Drugs available, Suction available and Patient being monitored Patient Re-evaluated:Patient Re-evaluated prior to induction Oxygen Delivery Method: Circle System Utilized Preoxygenation: Pre-oxygenation with 100% oxygen Induction Type: IV induction and Rapid sequence Ventilation: Mask ventilation without difficulty Laryngoscope Size: Mac and 3 Grade View: Grade I Tube type: Oral Tube size: 7.0 mm Number of attempts: 1 Airway Equipment and Method: Stylet and Oral airway Placement Confirmation: ETT inserted through vocal cords under direct vision,  positive ETCO2 and breath sounds checked- equal and bilateral Secured at: 22 cm Tube secured with: Tape Dental Injury: Teeth and Oropharynx as per pre-operative assessment

## 2017-11-27 NOTE — Anesthesia Postprocedure Evaluation (Signed)
Anesthesia Post Note  Patient: Sheryl James  Procedure(s) Performed: IR WITH ANESTHESIA (N/A )     Patient location during evaluation: PACU Anesthesia Type: General Level of consciousness: sedated Pain management: pain level controlled Vital Signs Assessment: post-procedure vital signs reviewed and stable Respiratory status: spontaneous breathing and respiratory function stable Cardiovascular status: stable Postop Assessment: no apparent nausea or vomiting Anesthetic complications: no    Last Vitals:  Vitals:   11/27/17 2058 11/27/17 2059  BP:    Pulse: 71 72  Resp: 19 19  Temp:    SpO2: 95% 95%    Last Pain:  Vitals:   11/27/17 1600  TempSrc:   PainSc: 0-No pain                 Donika Butner DANIEL

## 2017-11-27 NOTE — Progress Notes (Signed)
Patient ID: Sheryl James, female   DOB: March 11, 1955, 62 y.o.   MRN: [id number] BP 135/61   Pulse 80   Temp 98.4 F (36.9 C) (Oral)   Resp 20   Ht 5\' 2"  (1.575 m)   Wt 68 kg   SpO2 96%   BMI 27.44 kg/m  Called regarding Sheryl James. Repeat head ct did not show new blood, or significant change in ventricular size. However given the change, and the need at some point for ventricular drainage, I felt that placing the drain now would be the best.  She was lethargic, moving extremities. Would follow commands

## 2017-11-27 NOTE — Op Note (Signed)
 *   No surgery found *  PATIENT:   Sheryl James  62 y.o. female   PRE-OPERATIVE DIAGNOSIS: obstructive hydrocephalus, subarachnoid hemorrhage due to ruptured aneurysm  POST-OPERATIVE DIAGNOSIS:  Same  PROCEDURE:  Right frontal ventricular catheter placement  SURGEON:  Anothy Bufano ANESTHESIA:   local DRAINS: Ventriculostomy Drain in the right lateral ventricle.   SPECIMEN: none  DICTATION: Sheryl James had female  head shaved and then prepared in a sterile manner. I draped female  in a sterile manner. I injected lidocaine into the planned coronal incision in line with the right pupillary line anterior to the tragus. I opened the skin with a 15 blade. I used the hand twist drill to create a burr hole. I opened the dura with the 20 gauge spinal needle. I passed the ventricular catheter into the lateral ventricle. There was brisk flow of spinal fluid. I tunneled the catheter posteriorly and secured it to the skin at the exit. I approximated the scalp edges with nylon sutures. I placed a sterile dressing, then connected the catheter to the drainage system.   PLAN OF CARE: Admit to inpatient   PATIENT DISPOSITION:  PACU - hemodynamically stable.

## 2017-11-28 ENCOUNTER — Encounter (HOSPITAL_COMMUNITY): Payer: Self-pay | Admitting: Neurosurgery

## 2017-11-28 ENCOUNTER — Inpatient Hospital Stay (HOSPITAL_COMMUNITY): Payer: 59

## 2017-11-28 DIAGNOSIS — I609 Nontraumatic subarachnoid hemorrhage, unspecified: Secondary | ICD-10-CM

## 2017-11-28 LAB — BASIC METABOLIC PANEL
ANION GAP: 12 (ref 5–15)
BUN: 7 mg/dL — ABNORMAL LOW (ref 8–23)
CALCIUM: 8.1 mg/dL — AB (ref 8.9–10.3)
CHLORIDE: 102 mmol/L (ref 98–111)
CO2: 26 mmol/L (ref 22–32)
Creatinine, Ser: 0.5 mg/dL (ref 0.44–1.00)
GFR calc non Af Amer: >60 mL/min (ref >60)
GLUCOSE: 81 mg/dL (ref 70–99)
POTASSIUM: 2.4 mmol/L — AB (ref 3.5–5.1)
Sodium: 140 mmol/L (ref 135–145)

## 2017-11-28 LAB — TRIGLYCERIDES: Triglycerides: 86 mg/dL (ref <150)

## 2017-11-28 SURGERY — Surgical Case
Anesthesia: *Unknown

## 2017-11-28 MED ORDER — NIMODIPINE 60 MG/20ML PO SOLN
60.0000 mg | ORAL | Status: AC
  Administered 2017-11-28 – 2017-12-17 (×114): 60 mg via ORAL
  Filled 2017-11-28 (×109): qty 20

## 2017-11-28 MED ORDER — POTASSIUM CHLORIDE 10 MEQ/100ML IV SOLN
10.0000 meq | INTRAVENOUS | Status: DC
  Administered 2017-11-28 – 2017-11-29 (×5): 10 meq via INTRAVENOUS
  Filled 2017-11-28 (×5): qty 100

## 2017-11-28 MED ORDER — NIMODIPINE 30 MG PO CAPS
60.0000 mg | ORAL_CAPSULE | ORAL | Status: AC
  Administered 2017-12-11: 60 mg via ORAL
  Filled 2017-11-28 (×2): qty 2

## 2017-11-28 NOTE — Progress Notes (Signed)
Occupational Therapy Evaluation Patient Details Name: Sheryl James MRN: [id number] DOB: 1955/06/11 Today's Date: 11/28/2017    History of Present Illness 62 y.o. 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    Clinical Impression   PTA, pt lived at home and was independent with ADL/IADL tasks and mobility, drove. Pt demonstrates a significant functional decline at this time due to deficits listed below. Recommend rehab at CIR to facilitate sfae DC home with family. Will follow acutely.     Follow Up Recommendations  CIR;Supervision/Assistance - 24 hour    Equipment Recommendations  3 in 1 bedside commode    Recommendations for Other Services Rehab consult     Precautions / Restrictions Precautions Precautions: Other (comment)(IVC)      Mobility Bed Mobility Overal bed mobility: Needs Assistance Bed Mobility: Supine to Sit     Supine to sit: Min assist;+2 for line management     General bed mobility comments:   Transfers Overall transfer level: Needs assistance   Transfers: Sit to/from Stand Sit to Stand: Min assist         General transfer comment: cues for hand placement, stability assist    Balance Overall balance assessment: Needs assistance Sitting-balance support: Single extremity supported;Feet supported Sitting balance-Leahy Scale: Poor Sitting balance - Comments: slow lean right/posterior bias.  pt states she is not aware at the time.     Standing balance-Leahy Scale: Poor Standing balance comment: needs external assist                           ADL either performed or assessed with clinical judgement   ADL Overall ADL's : Needs assistance/impaired     Grooming: Minimal assistance;Sitting   Upper Body Bathing: Minimal assistance;Sitting   Lower Body Bathing: Moderate assistance;Sit to/from stand   Upper Body Dressing : Minimal assistance;Sitting   Lower Body Dressing: Moderate  assistance;Sit to/from stand   Toilet Transfer: Moderate assistance;Ambulation Toilet Transfer Details (indicate cue type and reason): simulated         Functional mobility during ADLs: Moderate assistance General ADL Comments: slow processing     Vision Baseline Vision/History: Wears glasses Wears Glasses: At all times Additional Comments: Keeping 1 eye closed; does not report double vision; will further assess     Perception     Praxis      Pertinent Vitals/Pain Pain Assessment: No/denies pain     Hand Dominance Right   Extremity/Trunk Assessment Upper Extremity Assessment Upper Extremity Assessment: Generalized weakness   Lower Extremity Assessment Lower Extremity Assessment: Generalized weakness   Cervical / Trunk Assessment Cervical / Trunk Assessment: Normal   Communication Communication Communication: No difficulties   Cognition Arousal/Alertness: Lethargic Behavior During Therapy: Impulsive;Flat affect Overall Cognitive Status: Impaired/Different from baseline Area of Impairment: Orientation;Attention;Memory;Following commands;Safety/judgement;Awareness;Problem solving                 Orientation Level: Disoriented to;Place;Time;Situation Current Attention Level: Focused Memory: Decreased short-term memory Following Commands: Follows one step commands consistently Safety/Judgement: Decreased awareness of safety;Decreased awareness of deficits Awareness: Intellectual Problem Solving: Slow processing     General Comments  sats dropped to 85% on RA during gait around the bed to the recliner.  O2 replaced ot 6L [place]    Exercises     Shoulder Instructions      Home Living Family/patient expects to be discharged to:: Private residence   Available Help at  Discharge: Family;Available PRN/intermittently Type of Home: House Home Access: Stairs to enter [employer] of Steps: 3   Home Layout: One level     Bathroom Shower/Tub:  Occupational psychologist: Standard Bathroom Accessibility: Yes How Accessible: Accessible via walker            Prior Functioning/Environment Level of Independence: Independent        Comments: drove; managed finances; medicaiton        OT Problem List: Decreased activity tolerance;Impaired balance (sitting and/or standing);Impaired vision/perception;Decreased cognition;Decreased safety awareness;Decreased knowledge of use of DME or AE;Cardiopulmonary status limiting activity      OT Treatment/Interventions: Self-care/ADL training;Therapeutic exercise;DME and/or AE instruction;Therapeutic activities;Cognitive remediation/compensation;Visual/perceptual remediation/compensation;Patient/family education;Balance training    OT Goals(Current goals can be found in the care plan section) Acute Rehab OT Goals Patient Stated Goal: pt did not state a goal. OT Goal Formulation: With patient/family Time For Goal Achievement: 12/12/17 Potential to Achieve Goals: Good ADL Goals Pt Will Perform Grooming: with modified independence;standing Pt Will Perform Upper Body Bathing: with modified independence;sitting Pt Will Perform Lower Body Bathing: with modified independence;sit to/from stand Pt Will Perform Upper Body Dressing: with modified independence;sitting Pt Will Perform Lower Body Dressing: with modified independence;sit to/from stand Pt Will Transfer to Toilet: with modified independence;ambulating Additional ADL Goal #1: Pt will demosntrate selective attention during ADL task in minimally distracting environment  OT Frequency: Min 2X/week   Barriers to D/C:            Co-evaluation PT/OT/SLP Co-Evaluation/Treatment: Yes Reason for Co-Treatment: Complexity of the patient's impairments (multi-system involvement) PT goals addressed during session: Mobility/safety with mobility OT goals addressed during session: ADL's and self-care      AM-PAC PT "6 Clicks" Daily  Activity     Outcome Measure Help from another person eating meals?: A Little Help from another person taking care of personal grooming?: A Little Help from another person toileting, which includes using toliet, bedpan, or urinal?: A Lot Help from another person bathing (including washing, rinsing, drying)?: A Lot Help from another person to put on and taking off regular upper body clothing?: A Little Help from another person to put on and taking off regular lower body clothing?: A Lot 6 Click Score: 15   End of Session Equipment Utilized During Treatment: Gait belt;Oxygen(6L) Nurse Communication: Mobility status;Other (comment)(need for alarm )  Activity Tolerance: Patient tolerated treatment well Patient left: in chair;with call bell/phone within reach;with family/visitor present  OT Visit Diagnosis: Unsteadiness on feet (R26.81);Muscle weakness (generalized) (M62.81);Other symptoms and signs involving cognitive function                Time: 1740-1810 OT Time Calculation (min): 30 min Charges:  OT General Charges $OT Visit: 1 Visit OT Evaluation $OT Eval Moderate Complexity: [place], OT/L   Acute OT Clinical Specialist Acute Rehabilitation Services Pager [phone] Office [phone]   [facility] 11/28/2017, 6:41 PM

## 2017-11-28 NOTE — Progress Notes (Signed)
At [place], patient's groin site level 0. HOB raised. Oral care done. Patient responding and following commands. Patient did well with sips of water, but unable to swallow nimotop pill. After she attempted the pill, she began coughing and spit up the pill, with more coughing prior. Holding meds and will reasses and evaluate for need of speech consult for swallowing eval.

## 2017-11-28 NOTE — Progress Notes (Signed)
Transcranial Doppler  Date POD PCO2 HCT BP  MCA ACA PCA OPHT SIPH VERT Basilar  10/4 JE     Right  Left   53  *   -41  *   *  -67   41  27   168  106   *  *   *           Right  Left                                            Right  Left                                             Right  Left                                             Right  Left                                            Right  Left                                            Right  Left                                        MCA = Middle Cerebral Artery      OPHT = Opthalmic Artery     BASILAR = Basilar Artery   ACA = Anterior Cerebral Artery     SIPH = Carotid Siphon PCA = Posterior Cerebral Artery   VERT = Verterbral Artery                   Normal MCA = 62+\-12 ACA = 50+\-12 PCA = 42+\-23   * = not insonated/ unable to obtain 11/28/17 Landry Mellow, RDMS, RVT

## 2017-11-28 NOTE — Evaluation (Signed)
Clinical/Bedside Swallow Evaluation Patient Details  Name: Sheryl James MRN: [id number] Date of Birth: Feb 13, 1956  Today's Date: 11/28/2017 Time: SLP Start Time (ACUTE ONLY): 6270 SLP Stop Time (ACUTE ONLY): 0905 SLP Time Calculation (min) (ACUTE ONLY): 13 min  Past Medical History: History reviewed. No pertinent past medical history. Past Surgical History: History reviewed. No pertinent surgical history. HPI:  62 y.o. female with recent HAs dx with 10.7 x6 x 79mm basilar apex aneurysm, s/p coiling 10/3, IVC. Coughing with meds s/p procedure; SLP referral for swallow eval.    Assessment / Plan / Recommendation Clinical Impression  Pt arousable but sluggish; no s/s of dysphagia. Presents with strong phonation, strong cough; no CN deficits.  Consumed 3 oz water without deficit; no s/s of aspiration.  Adequate mastication of cracker and applesauce; palpable swallow response.  Pt required cues to stay awake, respond to questions - lethargy will likely be only barrier to safe eating, but no indication of a biomechanical dysphagia.  Recommend resuming a regular diet, thin liquids; give meds whole in puree for now.  Spouse agrees with plan.  No SLP f/u for swallowing is warranted.,  SLP Visit Diagnosis: Dysphagia, unspecified (R13.10)    Aspiration Risk  Mild aspiration risk    Diet Recommendation   regular diet  Medication Administration: Whole meds with puree    Other  Recommendations Oral Care Recommendations: Oral care BID   Follow up Recommendations None      Frequency and Duration            Prognosis        Swallow Study   General Date of Onset: 11/26/17 HPI: 62 y.o. 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 Coughing with meds s/p procedure; SLP referral for swallow eval.  Type of Study: Bedside Swallow Evaluation Previous Swallow Assessment: no Diet Prior to this Study: NPO Temperature Spikes Noted: Yes Respiratory Status:  Nasal cannula History of Recent Intubation: Yes Length of Intubations (days): (for procedure) Behavior/Cognition: Alert Oral Cavity Assessment: Within Functional Limits Oral Care Completed by SLP: Recent completion by staff Oral Cavity - Dentition: Adequate natural dentition Vision: Functional for self-feeding Self-Feeding Abilities: Needs assist Patient Positioning: Upright in bed Baseline Vocal Quality: Normal Volitional Cough: Strong Volitional Swallow: Able to elicit    Oral/Motor/Sensory Function Overall Oral Motor/Sensory Function: Within functional limits   Ice Chips Ice chips: Within functional limits   Thin Liquid Thin Liquid: Within functional limits    Nectar Thick Nectar Thick Liquid: Not tested   Honey Thick Honey Thick Liquid: Not tested   Puree Puree: Within functional limits   Solid     Solid: Within functional limits      [name] 11/28/2017,9:32 AM  Estill Bamberg L. Tivis Ringer, [place] Office number [phone] Pager [phone]

## 2017-11-28 NOTE — Evaluation (Signed)
Speech Language Pathology Evaluation Patient Details Name: Sheryl James MRN: [id number] DOB: 03-04-1955 Today's Date: 11/28/2017 Time: 2440-1027 SLP Time Calculation (min) (ACUTE ONLY): 13 min  Problem List:  Patient Active Problem List   Diagnosis Date Noted  . Subarachnoid hemorrhage due to ruptured aneurysm ([place]) 11/26/2017   Past Medical History: History reviewed. No pertinent past medical history. Past Surgical History:  Past Surgical History:  Procedure Laterality Date  . RADIOLOGY WITH ANESTHESIA N/A 11/27/2017   Procedure: IR WITH ANESTHESIA;  Surgeon: Consuella Lose, MD;  Location: [place];  Service: Radiology;  Laterality: N/A;   HPI:  62 y.o. female with recent HAs dx with 10.7 x6 x 66mm basilar apex aneurysm, s/p coiling 10/3, IVC. Coughing with meds s/p procedure; SLP referral for swallow eval.    Assessment / Plan / Recommendation Clinical Impression  Pt presents with acute changes in overall cognition with regard to sustained attention, orientation to time/place/circumstances, working memory.  She is groggy, but was able to respond to questions with fluent and clear speech.  No specific language deficits noted.  Response time/initiation are delayed.  SLP will follow acutely to address basic cognition to facilitate safety; family education.     t       Follow Up Recommendations    tba   Frequency and Duration min 2x/week  1 week      SLP Evaluation Cognition  Overall Cognitive Status: Impaired/Different from baseline Arousal/Alertness: Lethargic Orientation Level: Oriented to person Attention: Sustained Sustained Attention: Impaired Sustained Attention Impairment: Functional basic Memory: Impaired Memory Impairment: Storage deficit Awareness: Impaired       Comprehension  Auditory Comprehension Overall Auditory Comprehension: Appears within functional limits for tasks assessed Yes/No Questions: Within Functional Limits Commands: Within Functional  Limits Conversation: Simple Reading Comprehension Reading Status: Not tested    Expression Expression Primary Mode of Expression: Verbal Verbal Expression Overall Verbal Expression: Appears within functional limits for tasks assessed   Oral / Motor  Oral Motor/Sensory Function Overall Oral Motor/Sensory Function: Within functional limits Motor Speech Overall Motor Speech: Appears within functional limits for tasks assessed   GO                    [name] 11/28/2017, 1:50 PM  Dennison Mcdaid L. Tivis Ringer, [place] Office number [phone] Pager [phone]

## 2017-11-28 NOTE — Evaluation (Signed)
Physical Therapy Evaluation Patient Details Name: Sheryl James MRN: [id number] DOB: 07/20/1955 Today's Date: 11/28/2017   History of Present Illness  62 y.o. 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   Clinical Impression  Pt admitted with/for ICH, s/p coiling.  Pt needing min assist presently.  Cognitively not at baseline.  Pt currently limited functionally due to the problems listed. ( See problems list.)   Pt will benefit from PT to maximize function and safety in order to get ready for next venue listed below.     Follow Up Recommendations CIR;Supervision/Assistance - 24 hour    Equipment Recommendations  Other (comment)(TBA)    Recommendations for Other Services Rehab consult     Precautions / Restrictions Precautions Precautions: Other (comment)(IVC)      Mobility  Bed Mobility Overal bed mobility: Needs Assistance Bed Mobility: Supine to Sit     Supine to sit: Min assist;+2 for physical assistance     General bed mobility comments: bridged to EOB without assist and the assisted up via left elbow to sit.  Min assis to scoot, but pt finished with bil UE and min guard.  Transfers Overall transfer level: Needs assistance   Transfers: Sit to/from Stand Sit to Stand: Min assist         General transfer comment: cues for hand placement, stability assist  Ambulation/Gait Ambulation/Gait assistance: Min assist;+2 physical assistance Gait Distance (Feet): 12 Feet   Gait Pattern/deviations: Step-through pattern   Gait velocity interpretation: <1.31 ft/sec, indicative of household ambulator General Gait Details: mildly unsteady gait with list to the right  Stairs            Wheelchair Mobility    Modified Rankin (Stroke Patients Only) Modified Rankin (Stroke Patients Only) Pre-Morbid Rankin Score: No symptoms Modified Rankin: Moderately severe disability     Balance Overall balance assessment: Needs  assistance Sitting-balance support: Single extremity supported;Feet supported Sitting balance-Leahy Scale: Poor Sitting balance - Comments: slow lean right.  pt states she is not aware at the time.     Standing balance-Leahy Scale: Poor Standing balance comment: needs external assist                             Pertinent Vitals/Pain Pain Assessment: No/denies pain    Home Living Family/patient expects to be discharged to:: Private residence   Available Help at Discharge: Family;Available PRN/intermittently Type of Home: House Home Access: Stairs to enter   [employer] of Steps: 3 Home Layout: One level        Prior Function Level of Independence: Independent         Comments: drove; managed finances; medicaiton     Hand Dominance   Dominant Hand: Right    Extremity/Trunk Assessment   Upper Extremity Assessment Upper Extremity Assessment: Generalized weakness    Lower Extremity Assessment Lower Extremity Assessment: Generalized weakness    Cervical / Trunk Assessment Cervical / Trunk Assessment: Normal  Communication   Communication: No difficulties  Cognition Arousal/Alertness: Lethargic Behavior During Therapy: Impulsive;Flat affect Overall Cognitive Status: Impaired/Different from baseline Area of Impairment: Orientation;Attention;Memory;Following commands;Safety/judgement;Awareness;Problem solving                 Orientation Level: Disoriented to;Place;Time;Situation Current Attention Level: Focused Memory: Decreased short-term memory Following Commands: Follows one step commands consistently Safety/Judgement: Decreased awareness of safety;Decreased awareness of deficits Awareness: Intellectual Problem Solving: Slow processing  General Comments General comments (skin integrity, edema, etc.): sats dropped to 85% on RA during gait around the bed to the recliner.  O2 replaced ot 6L     Exercises      Assessment/Plan    PT Assessment Patient needs continued PT services  PT Problem List Decreased strength;Decreased activity tolerance;Decreased balance;Decreased mobility;Decreased safety awareness;Decreased coordination       PT Treatment Interventions DME instruction;Gait training;Functional mobility training;Therapeutic activities;Balance training;Patient/family education    PT Goals (Current goals can be found in the Care Plan section)  Acute Rehab PT Goals Patient Stated Goal: pt did not state a goal. PT Goal Formulation: With patient Time For Goal Achievement: 12/12/17 Potential to Achieve Goals: Good    Frequency Min 3X/week   Barriers to discharge        Co-evaluation PT/OT/SLP Co-Evaluation/Treatment: Yes Reason for Co-Treatment: Complexity of the patient's impairments (multi-system involvement) PT goals addressed during session: Mobility/safety with mobility OT goals addressed during session: ADL's and self-care       AM-PAC PT "6 Clicks" Daily Activity  Outcome Measure Difficulty turning over in bed (including adjusting bedclothes, sheets and blankets)?: A Little Difficulty moving from lying on back to sitting on the side of the bed? : Unable Difficulty sitting down on and standing up from a chair with arms (e.g., wheelchair, bedside commode, etc,.)?: Unable Help needed moving to and from a bed to chair (including a wheelchair)?: A Little Help needed walking in hospital room?: A Little Help needed climbing 3-5 steps with a railing? : A Lot 6 Click Score: 13    End of Session Equipment Utilized During Treatment: Oxygen Activity Tolerance: Patient tolerated treatment well;Patient limited by fatigue Patient left: in chair;with call bell/phone within reach;with family/visitor present Nurse Communication: Mobility status;Precautions PT Visit Diagnosis: Unsteadiness on feet (R26.81);Other symptoms and signs involving the nervous system (R29.898)    Time:  1740-1810 PT Time Calculation (min) (ACUTE ONLY): 30 min   Charges:   PT Evaluation $PT Eval Moderate Complexity: 1 Mod          11/28/2017  Donnella Sham, PT Acute Rehabilitation Services [phone]  (pager) [phone]  (office)  [name] Dacia Capers 11/28/2017, 6:37 PM

## 2017-11-28 NOTE — Progress Notes (Signed)
At 0400, patient lethargic, but following commands. Unable to stay awake to answer all questions. PO meds held. Notified Dr. Zada Finders that second dose of nimotop was held. He agreed that if she was not able to wake up, she would need a nasogastric tube for meds, but agreed to wait until speech therapy consulted for speech evaluation in the morning.

## 2017-11-28 NOTE — Progress Notes (Signed)
Patient ID: Sheryl James, female   DOB: Dec 14, 1955, 62 y.o.   MRN: [id number] Lethargic, following commands Perrl, full eom Moving all extremities Ventricular catheter draining well.

## 2017-11-28 NOTE — Progress Notes (Addendum)
  NEUROSURGERY PROGRESS NOTE   No issues overnight.  EXAM:  BP (!) 127/56   Pulse 79   Temp (!) 100.6 F (38.1 C) (Axillary)   Resp 20   Ht 5\' 2"  (1.575 m)   Wt 68 kg   SpO2 96%   BMI 27.44 kg/m   Drowsy but easily awakened Not oriented to year or location Follows simple commands CN grossly intact MAE symmetrically. No focal deficits EVD in place with clear bloody drainage. 13cc drainage overnight  IMPRESSION:  62 y.o. female POD #1 basilar artery aneurysm coiling. Neurologically stable. - Continue nimotop - TCD - Work with therapy

## 2017-11-28 NOTE — Progress Notes (Signed)
OT Cancellation Note  Patient Details Name: Sheryl James MRN: [id number] DOB: 01/02/1956   Cancelled Treatment:    Reason Eval/Treat Not Completed: Active bedrest order  [place], OT/L   Acute OT Clinical Specialist Acute Rehabilitation Services Pager [phone] Office [phone]  11/28/2017, 9:03 AM

## 2017-11-29 ENCOUNTER — Inpatient Hospital Stay (HOSPITAL_COMMUNITY): Payer: 59

## 2017-11-29 LAB — BASIC METABOLIC PANEL
Anion gap: 10 (ref 5–15)
Anion gap: 6 (ref 5–15)
BUN: 5 mg/dL — AB (ref 8–23)
CALCIUM: 7.7 mg/dL — AB (ref 8.9–10.3)
CO2: 26 mmol/L (ref 22–32)
CO2: 25 mmol/L (ref 22–32)
CREATININE: 0.41 mg/dL — AB (ref 0.44–1.00)
Calcium: 7.8 mg/dL — ABNORMAL LOW (ref 8.9–10.3)
Chloride: 103 mmol/L (ref 98–111)
Chloride: 105 mmol/L (ref 98–111)
Creatinine, Ser: 0.53 mg/dL (ref 0.44–1.00)
GFR calc Af Amer: >60 mL/min (ref >60)
GFR calc non Af Amer: >60 mL/min (ref >60)
GLUCOSE: 109 mg/dL — AB (ref 70–99)
GLUCOSE: 140 mg/dL — AB (ref 70–99)
Potassium: 2.5 mmol/L — CL (ref 3.5–5.1)
Potassium: 2.6 mmol/L — CL (ref 3.5–5.1)
Sodium: 139 mmol/L (ref 135–145)
Sodium: 136 mmol/L (ref 135–145)

## 2017-11-29 LAB — CBC WITH DIFFERENTIAL/PLATELET
Abs Immature Granulocytes: 0.1 10*3/uL (ref 0.0–0.1)
BASOS PCT: 0 %
Basophils Absolute: 0 10*3/uL (ref 0.0–0.1)
EOS ABS: 0.1 10*3/uL (ref 0.0–0.7)
EOS PCT: 1 %
HEMATOCRIT: 34.1 % — AB (ref 36.0–46.0)
Hemoglobin: 11.3 g/dL — ABNORMAL LOW (ref 12.0–15.0)
Immature Granulocytes: 0 %
LYMPHS ABS: 1.8 10*3/uL (ref 0.7–4.0)
Lymphocytes Relative: 15 %
MCH: 30.3 pg (ref 26.0–34.0)
MCHC: 33.1 g/dL (ref 30.0–36.0)
MCV: 91.4 fL (ref 78.0–100.0)
MONO ABS: 1.2 10*3/uL — AB (ref 0.1–1.0)
MONOS PCT: 10 %
Neutro Abs: 8.5 10*3/uL — ABNORMAL HIGH (ref 1.7–7.7)
Neutrophils Relative %: 74 %
PLATELETS: 243 10*3/uL (ref 150–400)
RBC: 3.73 MIL/uL — ABNORMAL LOW (ref 3.87–5.11)
RDW: 13.6 % (ref 11.5–15.5)
WBC: 11.6 10*3/uL — ABNORMAL HIGH (ref 4.0–10.5)

## 2017-11-29 MED ORDER — LEVETIRACETAM 500 MG PO TABS
500.0000 mg | ORAL_TABLET | Freq: Two times a day (BID) | ORAL | Status: DC
  Administered 2017-11-29 – 2017-12-26 (×55): 500 mg via ORAL
  Filled 2017-11-29 (×54): qty 1

## 2017-11-29 MED ORDER — POTASSIUM CHLORIDE 10 MEQ/100ML IV SOLN
INTRAVENOUS | Status: AC
  Administered 2017-11-29: 01:00:00
  Filled 2017-11-29: qty 100

## 2017-11-29 MED ORDER — POTASSIUM CHLORIDE IN NACL 20-0.9 MEQ/L-% IV SOLN
INTRAVENOUS | Status: DC
  Administered 2017-11-29 – 2017-12-06 (×14): via INTRAVENOUS
  Administered 2017-12-06 – 2017-12-07 (×3): 1000 mL via INTRAVENOUS
  Administered 2017-12-07 – 2017-12-24 (×26): via INTRAVENOUS
  Filled 2017-11-29 (×48): qty 1000

## 2017-11-29 MED ORDER — POTASSIUM CHLORIDE 10 MEQ/100ML IV SOLN
10.0000 meq | INTRAVENOUS | Status: DC
  Administered 2017-11-29 (×4): 10 meq via INTRAVENOUS
  Filled 2017-11-29 (×5): qty 100

## 2017-11-29 MED ORDER — POTASSIUM CHLORIDE 20 MEQ PO PACK
20.0000 meq | PACK | Freq: Three times a day (TID) | ORAL | Status: AC
  Administered 2017-11-29 – 2017-11-30 (×5): 20 meq via ORAL
  Filled 2017-11-29 (×5): qty 1

## 2017-11-29 NOTE — Progress Notes (Signed)
CRITICAL VALUE ALERT  Critical Value:  K+: 2.6  Date & Time Notied:  11/29/2017 @ 1500  Provider Notified: Trinda Pascal, NP

## 2017-11-29 NOTE — Evaluation (Signed)
Clinical/Bedside Swallow Evaluation Patient Details  Name: Sheryl James MRN: [id number] Date of Birth: 1955-05-27  Today's Date: 11/29/2017 Time: SLP Start Time (ACUTE ONLY): 0900 SLP Stop Time (ACUTE ONLY): 0920 SLP Time Calculation (min) (ACUTE ONLY): 20 min  Past Medical History: History reviewed. No pertinent past medical history. Past Surgical History:  Past Surgical History:  Procedure Laterality Date  . RADIOLOGY WITH ANESTHESIA N/A 11/27/2017   Procedure: IR WITH ANESTHESIA;  Surgeon: Consuella Lose, MD;  Location: [facility];  Service: Radiology;  Laterality: N/A;   HPI:  62 y.o. 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 Coughing with meds s/p procedure and SLP ordered on 10/3. Results of BSE on 10/4 were normal swallow function without overt s/s of aspiration or penetration and only concerns were secondary to lethargy. Per RN, patient coughing with medications and unable to swallow pills and so a new BSE was ordered on 10/5.    Assessment / Plan / Recommendation Clinical Impression  Patient does not present with significant changes in her swallow function as compared to BSE completed yesterday. She was lethargic but alertness improved during PO intake. Patient was slow to masticate, orally manipulate and transit solid (purees and regular solids) texture boluses and exhibited a swallow initiation delay with all PO consistencies. She did exhibit three delayed, dry coughs at end of PO intake, but did not exhibit any other overt s/s of aspiration or penetration. Clinician suspects that patient's current swallowing difficulty is related to her lethargy rather than a change in swallow function.  SLP Visit Diagnosis: Dysphagia, unspecified (R13.10)    Aspiration Risk  Mild aspiration risk    Diet Recommendation Regular;Thin liquid   Liquid Administration via: Cup;Straw Medication Administration: Whole meds with puree Supervision: Staff  to assist with self feeding;Full supervision/cueing for compensatory strategies Compensations: Minimize environmental distractions;Slow rate;Small sips/bites Postural Changes: Seated upright at 90 degrees    Other  Recommendations Oral Care Recommendations: Oral care BID   Follow up Recommendations None      Frequency and Duration min 1 x/week  1 week       Prognosis Prognosis for Safe Diet Advancement: Good      Swallow Study   General Date of Onset: 11/26/17 HPI: 62 y.o. 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 Coughing with meds s/p procedure and SLP ordered on 10/3. Results of BSE on 10/4 were normal swallow function without overt s/s of aspiration or penetration and only concerns were secondary to lethargy. Per RN, patient coughing with medications and unable to swallow pills and so a new BSE was ordered on 10/5.  Type of Study: Bedside Swallow Evaluation Previous Swallow Assessment: BSE: 11/28/17: swallow function normal, regular solids, thin liquids, no follow up recommended for swallowing.  Diet Prior to this Study: Regular;Thin liquids Temperature Spikes Noted: No Respiratory Status: Nasal cannula History of Recent Intubation: Yes Length of Intubations (days): (for procedure) Behavior/Cognition: Alert;Lethargic/Drowsy;Cooperative;Requires cueing Oral Cavity Assessment: Within Functional Limits Oral Care Completed by SLP: No Oral Cavity - Dentition: Adequate natural dentition Vision: Functional for self-feeding Self-Feeding Abilities: Needs assist Patient Positioning: Upright in bed Baseline Vocal Quality: Normal Volitional Cough: Strong Volitional Swallow: Able to elicit    Oral/Motor/Sensory Function Overall Oral Motor/Sensory Function: Generalized oral weakness Lingual Strength: Reduced   Ice Chips Ice chips: Not tested   Thin Liquid Thin Liquid: Impaired Presentation: Cup Pharyngeal  Phase Impairments:  Suspected  delayed Swallow Other Comments: Patient exhbited three mild, dry coughs at end of PO intake with orange juice, yogurt and soft toast    Nectar Thick Nectar Thick Liquid: Not tested   Honey Thick Honey Thick Liquid: Not tested   Puree Puree: Impaired Pharyngeal Phase Impairments: Suspected delayed Swallow;Multiple swallows   Solid     Solid: Impaired Oral Phase Impairments: Reduced lingual movement/coordination Pharyngeal Phase Impairments: Suspected delayed [place], MA, CCC-SLP 11/29/17 1:33 PM

## 2017-11-29 NOTE — Progress Notes (Signed)
Pt.'s IV came out at 1850 and potassium was stopped. After insertion of new IV, potassium restarted.   Orders for potassium expired so charting is off due to later administration.   Omelia Blackwater RN, BSN.

## 2017-11-29 NOTE — Progress Notes (Signed)
Inpatient Rehabilitation  Per PT and OT request, patient was screened by Gunnar Fusi for appropriateness for an Inpatient Acute Rehab consult.  At this time we are planning to follow along for timing of medical readiness, note ventricular drain remains in.  Call if questions.  Carmelia Roller., CCC/SLP Admission Coordinator  [place]  Cell [phone]

## 2017-11-29 NOTE — Progress Notes (Signed)
No events or problems overnight.  Patient without complaints today.  Denies headache.  Febrile overnight.  Currently afebrile.  Vital signs are stable.  Ventriculostomy functioning well.  CSF blood-tinged.  Somnolent but awakens easily.  Speech weak but otherwise fluent.  Follows commands bilaterally.  Strength appears equal.  Status post coiling of basilar apex aneurysm.  Continue ventricular drain and ICU observation.

## 2017-11-29 NOTE — Progress Notes (Signed)
CRITICAL VALUE ALERT  Critical Value:  2.5 K+   Date & Time Notied: 11/29/2017 & 4944   Provider Notified: Viona Gilmore, NP  Orders Received/Actions taken:   6 runs of K+  BMET 1 hour after potassium completion.   Omelia Blackwater RN, BSN

## 2017-11-30 LAB — C DIFFICILE QUICK SCREEN W PCR REFLEX
C DIFFICILE (CDIFF) TOXIN: NEGATIVE
C DIFFICLE (CDIFF) ANTIGEN: NEGATIVE
C Diff interpretation: NOT DETECTED

## 2017-11-30 LAB — BASIC METABOLIC PANEL
Anion gap: 9 (ref 5–15)
BUN: <5 mg/dL — ABNORMAL LOW (ref 8–23)
CALCIUM: 8.1 mg/dL — AB (ref 8.9–10.3)
CO2: 26 mmol/L (ref 22–32)
Chloride: 104 mmol/L (ref 98–111)
Creatinine, Ser: 0.44 mg/dL (ref 0.44–1.00)
GFR calc non Af Amer: >60 mL/min (ref >60)
Glucose, Bld: 120 mg/dL — ABNORMAL HIGH (ref 70–99)
Potassium: 3.1 mmol/L — ABNORMAL LOW (ref 3.5–5.1)
SODIUM: 139 mmol/L (ref 135–145)

## 2017-11-30 MED ORDER — SACCHAROMYCES BOULARDII 250 MG PO CAPS
250.0000 mg | ORAL_CAPSULE | Freq: Two times a day (BID) | ORAL | Status: DC
  Administered 2017-11-30 – 2017-12-25 (×53): 250 mg via ORAL
  Filled 2017-11-30 (×55): qty 1

## 2017-11-30 MED ORDER — LOPERAMIDE HCL 1 MG/7.5ML PO SUSP
2.0000 mg | ORAL | Status: DC | PRN
  Administered 2017-11-30 – 2017-12-18 (×9): 2 mg via ORAL
  Filled 2017-11-30 (×11): qty 15

## 2017-11-30 NOTE — Progress Notes (Signed)
Overall stable.  One episode of fever last night.  Patient with loose stools.  Somnolent but awakens easily.  Speech reasonably fluent.  Motor 5/5 bilaterally.  Ventriculostomy output bloody and approximately 15 cc/h.  Overall progressing well.  Continue external ventricular drainage.

## 2017-11-30 NOTE — Progress Notes (Signed)
Pt has had multiple liquid stools, notified Meghan, NP.   She added order for the flexiseal and probiotics.  Will continue to monitor.   Omelia Blackwater RN, BSN

## 2017-12-01 ENCOUNTER — Inpatient Hospital Stay (HOSPITAL_COMMUNITY): Payer: 59

## 2017-12-01 DIAGNOSIS — I609 Nontraumatic subarachnoid hemorrhage, unspecified: Secondary | ICD-10-CM

## 2017-12-01 LAB — BASIC METABOLIC PANEL
ANION GAP: 9 (ref 5–15)
BUN: <5 mg/dL — ABNORMAL LOW (ref 8–23)
CALCIUM: 8.1 mg/dL — AB (ref 8.9–10.3)
CHLORIDE: 105 mmol/L (ref 98–111)
CO2: 24 mmol/L (ref 22–32)
Creatinine, Ser: 0.43 mg/dL — ABNORMAL LOW (ref 0.44–1.00)
GFR calc Af Amer: >60 mL/min (ref >60)
GFR calc non Af Amer: >60 mL/min (ref >60)
GLUCOSE: 132 mg/dL — AB (ref 70–99)
Potassium: 3.2 mmol/L — ABNORMAL LOW (ref 3.5–5.1)
Sodium: 138 mmol/L (ref 135–145)

## 2017-12-01 NOTE — Progress Notes (Signed)
Physical Therapy Treatment Patient Details Name: Sheryl James MRN: [id number] DOB: 09/28/1955 Today's Date: 12/01/2017    History of Present Illness 62 y.o. female with recent HAs dx with 10.7 x6 x 59mm basilar apex aneurysm, s/p coiling 10/3, IVC.     PT Comments    Lethargic today.  Pt needed extra assist all over.  Emphasis on bed mobility, sitting balance and  sit to stand   Follow Up Recommendations  CIR;Supervision/Assistance - 24 hour     Equipment Recommendations  Other (comment)    Recommendations for Other Services       Precautions / Restrictions Restrictions Weight Bearing Restrictions: No    Mobility  Bed Mobility Overal bed mobility: Needs Assistance Bed Mobility: Rolling;Sidelying to Sit;Sit to Supine Rolling: Mod assist;+2 for physical assistance Sidelying to sit: Mod assist;+2 for physical assistance   Sit to supine: Max assist;+2 for physical assistance   General bed mobility comments: cues for sequencing and truncal assist up from right elbow.  Transfers Overall transfer level: Needs assistance   Transfers: Sit to/from Stand Sit to Stand: Min assist;Mod assist;+2 physical assistance         General transfer comment: cues for hand placement and assist for coming forward and some boost.  Ambulation/Gait             General Gait Details: too lethargic to ambulate   Stairs             Wheelchair Mobility    Modified Rankin (Stroke Patients Only) Modified Rankin (Stroke Patients Only) Pre-Morbid Rankin Score: No symptoms Modified Rankin: Moderately severe disability     Balance Overall balance assessment: Needs assistance   Sitting balance-Leahy Scale: Poor Sitting balance - Comments: listed right and posterior   Standing balance support: Bilateral upper extremity supported Standing balance-Leahy Scale: Poor Standing balance comment: needs external assist.  1-2 minutes holding to chair back.                             Cognition Arousal/Alertness: Lethargic Behavior During Therapy: Flat affect;WFL for tasks assessed/performed Overall Cognitive Status: Impaired/Different from baseline Area of Impairment: Orientation;Attention;Memory;Following commands;Safety/judgement;Awareness;Problem solving                 Orientation Level: Disoriented to;Place;Time;Situation Current Attention Level: Focused Memory: Decreased short-term memory Following Commands: Follows one step commands consistently Safety/Judgement: Decreased awareness of safety;Decreased awareness of deficits Awareness: Intellectual Problem Solving: Slow processing        Exercises      General Comments        Pertinent Vitals/Pain Pain Assessment: Faces Faces Pain Scale: Hurts little more Pain Location: general with standing Pain Descriptors / Indicators: Grimacing Pain Intervention(s): Monitored during session    Home Living                      Prior Function            PT Goals (current goals can now be found in the care plan section) Acute Rehab PT Goals Patient Stated Goal: pt did not state a goal. PT Goal Formulation: With patient Time For Goal Achievement: 12/12/17 Potential to Achieve Goals: Good Progress towards PT goals: Progressing toward goals    Frequency    Min 3X/week      PT Plan Current plan remains appropriate    Co-evaluation              AM-PAC PT "  6 Clicks" Daily Activity  Outcome Measure  Difficulty turning over in bed (including adjusting bedclothes, sheets and blankets)?: Unable Difficulty moving from lying on back to sitting on the side of the bed? : Unable Difficulty sitting down on and standing up from a chair with arms (e.g., wheelchair, bedside commode, etc,.)?: Unable Help needed moving to and from a bed to chair (including a wheelchair)?: A Lot Help needed walking in hospital room?: A Lot Help needed climbing 3-5 steps with a railing? : A Lot 6  Click Score: 9    End of Session   Activity Tolerance: Patient tolerated treatment well;Patient limited by lethargy Patient left: in bed;with call bell/phone within reach;with family/visitor present;Other (comment)(pt needed to be in the bed for a vascular procedure) Nurse Communication: Mobility status;Precautions PT Visit Diagnosis: Other symptoms and signs involving the nervous system (R29.898);Other abnormalities of gait and mobility (R26.89);Muscle weakness (generalized) (M62.81)     Time: 4010-2725 PT Time Calculation (min) (ACUTE ONLY): 22 min  Charges:  $Therapeutic Activity: 8-22 mins                     12/01/2017  Donnella Sham, PT Acute Rehabilitation Services [phone]  (pager) [phone]  (office)   [name] Jerrard Bradburn 12/01/2017, 2:37 PM

## 2017-12-01 NOTE — Progress Notes (Signed)
Occupational Therapy Treatment Patient Details Name: Sheryl James MRN: [id number] DOB: 03-30-1955 Today's Date: 12/01/2017    History of present illness 62 y.o. female with recent HAs dx with 10.7 x6 x 59mm basilar apex aneurysm, s/p coiling 10/3, IVC.    OT comments  Session limited by lethargy today. Session completed in chair position. Pt with decline in function as compared to evaluation. Pt requires max to total A with ADL tasks. Able to focus attention to task for seconds. Able to name her daughters and state where they live. Pt left in chair position. Recommend nursing  Place pt in chair position at least TID. Will follow acutely and make recommendations to appropriate venue of care as pt progresses.   Follow Up Recommendations  CIR;Supervision/Assistance - 24 hour    Equipment Recommendations  3 in 1 bedside commode    Recommendations for Other Services Rehab consult    Precautions / Restrictions Precautions Precautions: Fall Restrictions Weight Bearing Restrictions: No       Mobility Bed Mobility    General bed mobility comments: Egress used/cahir position   Transfers       General transfer comment: not attempted today    Balance Overall balance assessment: Needs assistance   Sitting balance-Leahy Scale: Poor Sitting balance - Comments: R lean                              ADL either performed or assessed with clinical judgement   ADL Overall ADL's : Needs assistance/impaired Eating/Feeding: Maximal assistance   Grooming: Maximal assistance   Upper Body Bathing: Maximal assistance;Sitting   Lower Body Bathing: Total assistance   Upper Body Dressing : Total assistance;Bed level   Lower Body Dressing: Total assistance;Sit to/from stand                 General ADL Comments: decline in functional status     Vision   Additional Comments: keeping 1 eye closed   Perception     Praxis      Cognition Arousal/Alertness:  Lethargic Behavior During Therapy: Flat affect Overall Cognitive Status: Impaired/Different from baseline Area of Impairment: Orientation;Attention;Memory;Following commands;Safety/judgement;Awareness;Problem solving                 Orientation Level: Disoriented to;Place;Time;Situation Current Attention Level: Focused Memory: Decreased recall of precautions;Decreased short-term memory Following Commands: Follows one step commands inconsistently Safety/Judgement: Decreased awareness of safety;Decreased awareness of deficits Awareness: Intellectual Problem Solving: Slow processing;Decreased initiation;Difficulty sequencing;Requires verbal cues;Requires tactile cues General Comments: Able to name daughters and where they live; named "Ovid Curd the wonder dog"; perseveration; difficulty with initiation        Exercises Exercises: Other exercises Other Exercises Other Exercises: BUE A/AA/PROM as tolerated    Shoulder Instructions       General Comments      Pertinent Vitals/ Pain       Pain Assessment: Faces Faces Pain Scale: Hurts little more Pain Location: general grimacing Pain Descriptors / Indicators: Grimacing Pain Intervention(s): Limited activity within patient's tolerance  Home Living                                          Prior Functioning/Environment              Frequency  Min 2X/week        Progress Toward Goals  OT Goals(current goals can now be found in the care plan section)  Progress towards OT goals: Not progressing toward goals - comment;OT to reassess next treatment(level of arousal)  Acute Rehab OT Goals Patient Stated Goal: pt did not state a goal. OT Goal Formulation: With patient/family Time For Goal Achievement: 12/12/17 Potential to Achieve Goals: Good ADL Goals Pt Will Perform Grooming: with modified independence;standing Pt Will Perform Upper Body Bathing: with modified independence;sitting Pt Will Perform  Lower Body Bathing: with modified independence;sit to/from stand Pt Will Perform Upper Body Dressing: with modified independence;sitting Pt Will Perform Lower Body Dressing: with modified independence;sit to/from stand Pt Will Transfer to Toilet: with modified independence;ambulating Additional ADL Goal #1: Pt will demosntrate selective attention during ADL task in minimally distracting environment  Plan Discharge plan remains appropriate    Co-evaluation                 AM-PAC PT "6 Clicks" Daily Activity     Outcome Measure   Help from another person eating meals?: A Lot Help from another person taking care of personal grooming?: A Lot Help from another person toileting, which includes using toliet, bedpan, or urinal?: Total Help from another person bathing (including washing, rinsing, drying)?: A Lot Help from another person to put on and taking off regular upper body clothing?: A Lot Help from another person to put on and taking off regular lower body clothing?: Total 6 Click Score: 10    End of Session Equipment Utilized During Treatment: Oxygen  OT Visit Diagnosis: Unsteadiness on feet (R26.81);Muscle weakness (generalized) (M62.81);Other symptoms and signs involving cognitive function   Activity Tolerance Patient limited by lethargy   Patient Left in bed;with call bell/phone within reach;with family/visitor present   Nurse Communication Other (comment)(level of arousal)        Time: 9179-1505 OT Time Calculation (min): 30 min  Charges: OT General Charges $OT Visit: 1 Visit OT Treatments $Self Care/Home Management : 23-37 mins  Maurie Boettcher, OT/L   Acute OT Clinical Specialist [place] Pager [phone] Office [phone]    [facility] 12/01/2017, 3:48 PM

## 2017-12-01 NOTE — Progress Notes (Signed)
SLP Cancellation Note  Patient Details Name: Sheryl James MRN: [id number] DOB: 12/09/1955   Cancelled treatment:       Reason Eval/Treat Not Completed: Fatigue/lethargy limiting ability to participate- pt not alert enough for POs.  Will follow.    [name] 12/01/2017, 2:49 PM

## 2017-12-01 NOTE — Progress Notes (Signed)
  NEUROSURGERY PROGRESS NOTE   No issues overnight. Pt reports no significant HA.  EXAM:  BP (!) 156/66   Pulse 76   Temp (!) 100.9 F (38.3 C) (Oral)   Resp (!) 21   Ht 5\' 2"  (1.575 m)   Wt 70.8 kg   SpO2 98%   BMI 28.55 kg/m   Awake, alert Speech fluent, appropriate  CN grossly intact  5/5 BUE/BLE  EVD in place  IMPRESSION:  62 y.o. female [place] d# 5 s/p coiling basilar aneurysm, neurologically stable  PLAN: - d/c aline - d/c foley - OOB with PT/OT - TCD today

## 2017-12-01 NOTE — Progress Notes (Signed)
Transcranial Doppler  Date POD PCO2 HCT BP  MCA ACA PCA OPHT SIPH VERT Basilar  10/4 JE     Right  Left   53  *   -41  *   *  -67   41  27   168  106   *  *   *      10/4 VS     Right  Left   *  *   *  *   *  *   29  21   ?55  *   *  *   *  *         Right  Left                                             Right  Left                                             Right  Left                                            Right  Left                                            Right  Left                                        MCA = Middle Cerebral Artery      OPHT = Opthalmic Artery     BASILAR = Basilar Artery   ACA = Anterior Cerebral Artery     SIPH = Carotid Siphon PCA = Posterior Cerebral Artery   VERT = Verterbral Artery                   Normal MCA = 62+\-12 ACA = 50+\-12 PCA = 42+\-23   * = not insonated/ unable to obtain 11/28/17 Landry Mellow, RDMS, RVT * = Not insonated unable to obtain Severely technically limited due to poor windows, constant turning of the head , and respiratory interference - heavy snoring 12/01/2017 [place] Arnez Stoneking,RVS

## 2017-12-02 NOTE — Plan of Care (Signed)
Consumed 75% of meal tray.

## 2017-12-02 NOTE — Progress Notes (Signed)
  Speech Language Pathology Treatment: Dysphagia;Cognitive-Linquistic  Patient Details Name: Sheryl James MRN: [id number] DOB: [birth year] Today's Date: 12/02/2017 Time: 2774-1287 SLP Time Calculation (min) (ACUTE ONLY): 22 min  Assessment / Plan / Recommendation Clinical Impression  Pt with improved alertness; husband and sister-in-law present.  Able to hold cup with RUE and drink from straw without difficulty, no concerns for airway compromise.  She continues to have difficulty manipulating large pills, but had no trouble eating breakfast this am.  Pt's cognition is marked by significant deficits in initiating and sustaining motor activity, as well as attention, over time. She does not act on her environment or make needs known, but will answer questions about basic needs with delayed response time.  When given task instructions to name items without delay, but was able to improve verbal response time from >20 seconds to less than five seconds on 80% of trials. She followed simple commands with 100% accuracy, but required verbal prompts to follow-through on 50% of opportunities.  Pt's husband, Sheryl James, became emotional during session, asking questions about insurance, eventual level of care.  We discussed pt's potential to D/C to CIR for rehab, but acknowledged that pt will need to be able to handle three hours of therapy a day.  Given pt's improved participation today, Sheryl James was encouraged that pt will begin to show greater progress.  SLP will continue to follow for cognition/communication.  No further f/u for swallowing is needed.    HPI HPI: 62 y.o. 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 Coughing with meds s/p procedure and SLP ordered on 10/3. Results of BSE on 10/4 were normal swallow function without overt s/s of aspiration or penetration and only concerns were secondary to lethargy. Per RN, patient coughing with medications and unable to  swallow pills and so a new BSE was ordered on 10/5.       SLP Plan  Continue with current plan of care       Recommendations  Diet recommendations: Regular;Thin liquid Liquids provided via: Cup;Straw Medication Administration: Whole meds with puree Supervision: Staff to assist with self feeding Compensations: Minimize environmental distractions;Slow rate;Small sips/bites Postural Changes and/or Swallow Maneuvers: Seated upright 90 degrees                Oral Care Recommendations: Oral care BID Follow up Recommendations: Inpatient Rehab SLP Visit Diagnosis: Dysphagia, unspecified (R13.10);Cognitive communication deficit (R41.841) Plan: Continue with current plan of care       GO                Assunta Curtis 12/02/2017, 11:41 AM  Estill Bamberg L. Tivis Ringer, [place] Office number [phone] Pager [phone]

## 2017-12-02 NOTE — Progress Notes (Signed)
  NEUROSURGERY PROGRESS NOTE   No issues overnight.   EXAM:  BP (!) 166/71   Pulse 84   Temp 97.8 F (36.6 C) (Oral)   Resp (!) 28   Ht 5\' 2"  (1.575 m)   Wt 70.8 kg   SpO2 96%   BMI 28.55 kg/m   Awake, alert Speech fluent, appropriate  CN grossly intact  Good strength throughout  IMPRESSION:  62 y.o. female SAH d# 6 s/p coiling basilar aneurysm, remains stable  PLAN: - Cont attempts to mobilize - Cont EVD drainage for now.

## 2017-12-02 NOTE — Plan of Care (Signed)
Pt participating in ADLs today. Pt consumed 75% of meal tray.

## 2017-12-03 ENCOUNTER — Inpatient Hospital Stay (HOSPITAL_COMMUNITY): Payer: 59

## 2017-12-03 DIAGNOSIS — I609 Nontraumatic subarachnoid hemorrhage, unspecified: Secondary | ICD-10-CM

## 2017-12-03 NOTE — Progress Notes (Signed)
Occupational Therapy Treatment Patient Details Name: Sheryl James MRN: [id number] DOB: 07-Nov-1955 Today's Date: 12/03/2017    History of present illness 62 y.o. female with recent HAs dx with 10.7 x6 x 36mm basilar apex aneurysm, s/p coiling 10/3, IVC.    OT comments  Pt lethargic during session. Increased state of arousal once seated EOB. Pt with significant posterior lean requiring mod A to maintain upright posture. Mod A +2 to stand and Max A to side step toward L. Poor attention to task and difficulty initiating activities. Will continue to follow acutely. Recommend CIR for rehab. Will need 24/7 assistance after DC from CIR.   Follow Up Recommendations  CIR;Supervision/Assistance - 24 hour    Equipment Recommendations  3 in 1 bedside commode    Recommendations for Other Services Rehab consult    Precautions / Restrictions Precautions Precautions: Fall Precaution Comments: EVD must be clamped       Mobility Bed Mobility Overal bed mobility: Needs Assistance   Rolling: Mod assist Sidelying to sit: Max assist Supine to sit: Max assist Sit to supine: Max assist      Transfers Overall transfer level: Needs assistance Equipment used: Rolling walker (2 wheeled) Transfers: Sit to/from Stand Sit to Stand: +2 physical assistance;Mod assist         General transfer comment: Hands on RW to encourage anterior weight shft    Balance Overall balance assessment: Needs assistance   Sitting balance-Leahy Scale: Poor Sitting balance - Comments: posteiror lean; increased posterior lean from evaluation   Standing balance support: Bilateral upper extremity supported Standing balance-Leahy Scale: Poor                             ADL either performed or assessed with clinical judgement   ADL Overall ADL's : Needs assistance/impaired Eating/Feeding: Maximal assistance                                   Functional mobility during ADLs: Moderate  assistance;+2 for physical assistance General ADL Comments: Continued vc to sustain attention to activity; postural control affected by poor attention; Max assist to side step toward [facility] using RW     Vision       Perception     Praxis      Cognition Arousal/Alertness: Lethargic Behavior During Therapy: Flat affect Overall Cognitive Status: Impaired/Different from baseline Area of Impairment: Orientation;Attention;Memory;Following commands;Safety/judgement;Awareness;Problem solving                 Orientation Level: Disoriented to;Time Current Attention Level: Focused Memory: Decreased recall of precautions;Decreased short-term memory Following Commands: Follows one step commands inconsistently Safety/Judgement: Decreased awareness of safety;Decreased awareness of deficits Awareness: Intellectual Problem Solving: Slow processing;Decreased initiation;Difficulty sequencing;Requires verbal cues;Requires tactile cues          Exercises     Shoulder Instructions       General Comments      Pertinent Vitals/ Pain       Pain Assessment: Faces Faces Pain Scale: Hurts a little bit Pain Location: general grimacing Pain Descriptors / Indicators: Grimacing Pain Intervention(s): Limited activity within patient's tolerance  Home Living                                          Prior Functioning/Environment  Frequency  Min 2X/week        Progress Toward Goals  OT Goals(current goals can now be found in the care plan section)  Progress towards OT goals: Progressing toward goals;OT to reassess next treatment  Acute Rehab OT Goals Patient Stated Goal: pt did not state a goal. OT Goal Formulation: With patient/family Time For Goal Achievement: 12/12/17 Potential to Achieve Goals: Good ADL Goals Pt Will Perform Grooming: with modified independence;standing Pt Will Perform Upper Body Bathing: with modified independence;sitting Pt  Will Perform Lower Body Bathing: with modified independence;sit to/from stand Pt Will Perform Upper Body Dressing: with modified independence;sitting Pt Will Perform Lower Body Dressing: with modified independence;sit to/from stand Pt Will Transfer to Toilet: with modified independence;ambulating Additional ADL Goal #1: Pt will demosntrate selective attention during ADL task in minimally distracting environment  Plan Discharge plan remains appropriate    Co-evaluation                 AM-PAC PT "6 Clicks" Daily Activity     Outcome Measure   Help from another person eating meals?: A Lot Help from another person taking care of personal grooming?: A Lot Help from another person toileting, which includes using toliet, bedpan, or urinal?: Total Help from another person bathing (including washing, rinsing, drying)?: A Lot Help from another person to put on and taking off regular upper body clothing?: A Lot Help from another person to put on and taking off regular lower body clothing?: Total 6 Click Score: 10    End of Session    OT Visit Diagnosis: Unsteadiness on feet (R26.81);Muscle weakness (generalized) (M62.81);Other symptoms and signs involving cognitive function   Activity Tolerance Patient limited by lethargy   Patient Left in bed;with call bell/phone within reach;with bed alarm set;with family/visitor present;with SCD's reapplied   Nurse Communication Mobility status        Time: 9794-8016 OT Time Calculation (min): 34 min  Charges: OT General Charges $OT Visit: 1 Visit OT Treatments $Self Care/Home Management : 23-37 mins  Maurie Boettcher, OT/L   Acute OT Clinical Specialist [place] Pager [phone] Office [phone]    [facility] 12/03/2017, 5:27 PM

## 2017-12-03 NOTE — Progress Notes (Signed)
Patient ID: Sheryl James, female   DOB: 28-Sep-1955, 62 y.o.   MRN: [id number] BP (!) 163/111   Pulse 91   Temp 98.1 F (36.7 C) (Oral)   Resp 16   Ht 5\' 2"  (1.575 m)   Wt 70.8 kg   SpO2 93%   BMI 28.55 kg/m  Alert today, initiating conversation Perrl, full eom Ventricular catheter is draining well Improving neurologically

## 2017-12-03 NOTE — Progress Notes (Addendum)
Transcranial Doppler  Date POD PCO2 HCT BP  MCA ACA PCA OPHT SIPH VERT Basilar  10/4 JE     Right  Left   53  *   -41  *   *  -67   41  27   168  106   *  *   *      10/4 VS     Right  Left   *  *   *  *   *  *   29  21   ?55  *   *  *   *  *    10/9 MS     Right  Left   13  15   -73  -78   35  *   31  26   31   79   -27  -31   -31      10/11 MS      Right  Left   *  40   *  -16   *  33   27  18   48  41   *  *   *            Right  Left                                            Right  Left                                            Right  Left                                        MCA = Middle Cerebral Artery      OPHT = Opthalmic Artery     BASILAR = Basilar Artery   ACA = Anterior Cerebral Artery     SIPH = Carotid Siphon PCA = Posterior Cerebral Artery   VERT = Verterbral Artery                   Normal MCA = 62+\-12 ACA = 50+\-12 PCA = 42+\-23   * = not insonated/ unable to obtain 11/28/17 Landry Mellow, RDMS, RVT * = Not insonated unable to obtain Severely technically limited due to poor windows, constant turning of the head , and respiratory interference - heavy snoring 12/01/2017 Virginia Slaughter,RVS  Rt [place] ratio=0.34 Lt [place] ratio= 0.47 12/03/2017 5:04 PM Maudry Mayhew, MHA, RVT, RDCS, RDMS   *Study was technically limited and difficult due to poor acoustic windows and poor patient cooperation. 12/05/2017 4:12 PM Maudry Mayhew, MHA, RVT, RDCS, RDMS

## 2017-12-03 NOTE — Progress Notes (Signed)
Physical Therapy Treatment Patient Details Name: Sheryl James MRN: [id number] DOB: [birth year] Today's Date: 12/03/2017    History of Present Illness 62 y.o. female with recent HAs dx with 10.7 x6 x 12mm basilar apex aneurysm, s/p coiling 10/3, IVC.     PT Comments    Awake, improved affect.  Following simple commands.  Emphasis on transition to EOB, sitting balance, standing and transfers to chair.    Follow Up Recommendations  CIR;Supervision/Assistance - 24 hour     Equipment Recommendations  Other (comment)    Recommendations for Other Services       Precautions / Restrictions Precautions Precautions: Fall    Mobility  Bed Mobility Overal bed mobility: Needs Assistance Bed Mobility: Rolling;Sidelying to Sit Rolling: Mod assist;+2 for physical assistance Sidelying to sit: Mod assist;Max assist       General bed mobility comments: pt participated throughout.  Truncal assist given to allow pt to assist with R UE  Transfers Overall transfer level: Needs assistance   Transfers: Sit to/from Stand;Stand Pivot Transfers Sit to Stand: Mod assist(x2) Stand pivot transfers: Mod assist;+2 safety/equipment       General transfer comment: face to face assist used for stand and transfers.  pt stood for approx 20 sec for peri care due to incontinence  Ambulation/Gait                 Stairs             Wheelchair Mobility    Modified Rankin (Stroke Patients Only) Modified Rankin (Stroke Patients Only) Pre-Morbid Rankin Score: No symptoms Modified Rankin: Moderately severe disability     Balance Overall balance assessment: Needs assistance Sitting-balance support: Single extremity supported;Bilateral upper extremity supported;Feet supported Sitting balance-Leahy Scale: Poor Sitting balance - Comments: Initially pt need minimal assist, but as she fatigued at EOB, she started slumping right and posteriorly.  pt need  assist scooting back on the bed 2  times due to sliding off the bed.   Standing balance support: Bilateral upper extremity supported Standing balance-Leahy Scale: Poor Standing balance comment: face to face assist for complete upright stance                             Cognition Arousal/Alertness: Awake/alert Behavior During Therapy: WFL for tasks assessed/performed;Flat affect Overall Cognitive Status: Impaired/Different from baseline(not test formally)                                        Exercises Other Exercises Other Exercises: Bil UE bicep/tricep presses x 7, bil hip/knee flex/ext with graded resistance x 5    General Comments        Pertinent Vitals/Pain Pain Assessment: Faces Faces Pain Scale: Hurts a little bit Pain Location: general grimacing Pain Descriptors / Indicators: Grimacing    Home Living                      Prior Function            PT Goals (current goals can now be found in the care plan section) Acute Rehab PT Goals Patient Stated Goal: pt did not state a goal. PT Goal Formulation: With patient Time For Goal Achievement: 12/12/17 Potential to Achieve Goals: Good Progress towards PT goals: Progressing toward goals    Frequency    Min 3X/week  PT Plan Current plan remains appropriate    Co-evaluation              AM-PAC PT "6 Clicks" Daily Activity  Outcome Measure  Difficulty turning over in bed (including adjusting bedclothes, sheets and blankets)?: Unable Difficulty moving from lying on back to sitting on the side of the bed? : Unable Difficulty sitting down on and standing up from a chair with arms (e.g., wheelchair, bedside commode, etc,.)?: Unable Help needed moving to and from a bed to chair (including a wheelchair)?: A Lot Help needed walking in hospital room?: A Lot Help needed climbing 3-5 steps with a railing? : A Lot 6 Click Score: 9    End of Session     Patient left: in chair;with call bell/phone  within reach;with chair alarm set;with family/visitor present Nurse Communication: Mobility status;Precautions PT Visit Diagnosis: Other symptoms and signs involving the nervous system (R29.898);Other abnormalities of gait and mobility (R26.89);Muscle weakness (generalized) (M62.81)     Time: 3545-6256 PT Time Calculation (min) (ACUTE ONLY): 30 min  Charges:  $Therapeutic Activity: 23-37 mins                     12/03/2017  Donnella Sham, PT Acute Rehabilitation Services [phone]  (pager) [phone]  (office)   [name] Milady Fleener 12/03/2017, 3:25 PM

## 2017-12-03 NOTE — Progress Notes (Signed)
Pt's husband was concerned the pt's niece, who is a [employer] scientist, received information while visiting one day. He changed the password to ensure she can't call to get updates.

## 2017-12-03 NOTE — Progress Notes (Addendum)
  NEUROSURGERY PROGRESS NOTE   No issues overnight. Much more awake this am, eating breakfast  EXAM:  BP (!) 188/78   Pulse 81   Temp 98.4 F (36.9 C) (Oral)   Resp 18   Ht 5\' 2"  (1.575 m)   Wt 70.8 kg   SpO2 94%   BMI 28.55 kg/m   Awake, alert Speech fluent, appropriate  CN grossly intact  Good strength throughout  IMPRESSION:  62 y.o. female SAH d# 7 s/p coiling basilar aneurysm, remains stable  PLAN: - Cont attempts to mobilize - TCD today - Cont EVD drainage for now, may attempt to challenge EVD tomorrow

## 2017-12-04 NOTE — Progress Notes (Signed)
  NEUROSURGERY PROGRESS NOTE   No issues overnight.   EXAM:  BP (!) 164/69   Pulse 88   Temp 99.1 F (37.3 C) (Oral)   Resp (!) 27   Ht 5\' 2"  (1.575 m)   Wt 70.8 kg   SpO2 92%   BMI 28.55 kg/m   Awake, alert Speech fluent, appropriate  CN grossly intact  Good strength throughout  TCD: Date POD PCO2 HCT BP  MCA ACA PCA OPHT SIPH VERT Basilar  10/4 JE     Right  Left   53  *   -41  *   *  -67   41  27   168  106   *  *   *      10/4 VS     Right  Left   *  *   *  *   *  *   29  21   ?55  *   *  *   *  *    10/9 MS     Right  Left   13  15   -73  -78   35  *   31  26   31   79   -27  -31   -31     Essentially normal, not concerning for spasm  IMPRESSION:  62 y.o. female [place] d# 9 s/p coiling basilar aneurysm, remains stable  PLAN: - Cont attempts to mobilize - EVD raised to 65mmHg today, if stable plan on clamping tomorrow

## 2017-12-04 NOTE — Progress Notes (Signed)
SLP Cancellation Note  Patient Details Name: Sheryl James MRN: [id number] DOB: May 28, 1955   Cancelled treatment:       Pt could not be aroused to participate in cognitive treatment today. ST will continue efforts.   Jettie Booze, Student SLP    Jettie Booze 12/04/2017, 3:36 PM

## 2017-12-05 ENCOUNTER — Inpatient Hospital Stay (HOSPITAL_COMMUNITY): Payer: 59

## 2017-12-05 DIAGNOSIS — I609 Nontraumatic subarachnoid hemorrhage, unspecified: Secondary | ICD-10-CM

## 2017-12-05 MED ORDER — MIDAZOLAM HCL 2 MG/2ML IJ SOLN
INTRAMUSCULAR | Status: AC
  Filled 2017-12-05: qty 2

## 2017-12-05 MED ORDER — MORPHINE SULFATE (PF) 4 MG/ML IV SOLN
INTRAVENOUS | Status: AC
  Administered 2017-12-05: 4 mg
  Filled 2017-12-05: qty 1

## 2017-12-05 NOTE — Progress Notes (Signed)
Transcranial Doppler  Date POD PCO2 HCT BP  MCA ACA PCA OPHT SIPH VERT Basilar  10/4 JE     Right  Left   53  *   -41  *   *  -67   41  27   168  106   *  *   *      10/4 VS     Right  Left   *  *   *  *   *  *   29  21   ?55  *   *  *   *  *    10/9 MS     Right  Left   13  15   -73  -78   35  *   31  26   31   79   -27  -31   -31      10/11 MS      Right  Left   *  40   *  -16   *  33   27  18   48  41   *  *   *            Right  Left                                            Right  Left                                            Right  Left                                        MCA = Middle Cerebral Artery      OPHT = Opthalmic Artery     BASILAR = Basilar Artery   ACA = Anterior Cerebral Artery     SIPH = Carotid Siphon PCA = Posterior Cerebral Artery   VERT = Verterbral Artery                   Normal MCA = 62+\-12 ACA = 50+\-12 PCA = 42+\-23   * = not insonated/ unable to obtain 11/28/17 Landry Mellow, RDMS, RVT * = Not insonated unable to obtain Severely technically limited due to poor windows, constant turning of the head , and respiratory interference - heavy snoring 12/01/2017 Virginia Slaughter,RVS  Rt Lindegaard ratio=0.34 Lt Lindegaard ratio= 0.47 12/05/2017 7:01 PM Maudry Mayhew, MHA, RVT, RDCS, RDMS   *Study was technically limited and difficult due to poor acoustic windows and poor patient cooperation. 12/05/2017 7:01 PM Maudry Mayhew, MHA, RVT, RDCS, RDMS

## 2017-12-05 NOTE — Progress Notes (Signed)
Physical Therapy Treatment Patient Details Name: Sheryl James MRN: [id number] DOB: June 07, 1955 Today's Date: 12/05/2017    History of Present Illness 62 y.o. 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     PT Comments    Lethargic and slow to participate given instruction. Emphasis on transitions to EOB including w/shift and scooting, sit to stand and gait in the RW   Follow Up Recommendations  CIR;Supervision/Assistance - 24 hour(when she is able to consistently remain alert)     Equipment Recommendations  Other (comment)(TBA)    Recommendations for Other Services       Precautions / Restrictions Precautions Precautions: Fall    Mobility  Bed Mobility Overal bed mobility: Needs Assistance Bed Mobility: Rolling;Sidelying to Sit Rolling: Mod assist;Max assist;+2 for safety/equipment Sidelying to sit: Max assist;+2 for physical assistance       General bed mobility comments: slow initiation, multimodal cues to get pt to sequence/stay on task. significant truncal assis to sitting.  Scooted to EOB with max assist  Transfers Overall transfer level: Needs assistance   Transfers: Sit to/from Stand Sit to Stand: +2 physical assistance;Mod assist         General transfer comment: cues for hand placement and assist for direction, coming forward and a little boost.  Ambulation/Gait Ambulation/Gait assistance: Mod assist;+2 safety/equipment Gait Distance (Feet): 10 Feet Assistive device: Rolling walker (2 wheeled) Gait Pattern/deviations: Step-through pattern;Step-to pattern   Gait velocity interpretation: <1.31 ft/sec, indicative of household ambulator General Gait Details: very slow, unsteady steps with w/shift assist and truncal stability   Stairs             Wheelchair Mobility    Modified Rankin (Stroke Patients Only) Modified Rankin (Stroke Patients Only) Modified Rankin: Moderately severe  disability     Balance Overall balance assessment: Needs assistance Sitting-balance support: Single extremity supported;Bilateral upper extremity supported Sitting balance-Leahy Scale: Poor Sitting balance - Comments: at best min guard with bil UE assist     Standing balance-Leahy Scale: Poor Standing balance comment: AD and external support                            Cognition Arousal/Alertness: Lethargic Behavior During Therapy: Flat affect Overall Cognitive Status: Impaired/Different from baseline Area of Impairment: Orientation;Attention;Memory;Following commands;Safety/judgement;Awareness;Problem solving                 Orientation Level: Time;Situation Current Attention Level: Focused Memory: Decreased recall of precautions;Decreased short-term memory Following Commands: Follows one step commands inconsistently Safety/Judgement: Decreased awareness of safety;Decreased awareness of deficits Awareness: Intellectual Problem Solving: Slow processing;Decreased initiation;Difficulty sequencing;Requires verbal cues;Requires tactile cues        Exercises Other Exercises Other Exercises: bil hip/knee flexion/ext with graded extention x5 reps    General Comments General comments (skin integrity, edema, etc.): vss      Pertinent Vitals/Pain Pain Assessment: Faces Faces Pain Scale: Hurts a little bit Pain Location: general grimacing Pain Descriptors / Indicators: Grimacing Pain Intervention(s): Monitored during session    Home Living                      Prior Function            PT Goals (current goals can now be found in the care plan section) Acute Rehab PT Goals Patient Stated Goal: pt did not state a goal. PT Goal Formulation: With patient Time For  Goal Achievement: 12/12/17 Potential to Achieve Goals: Good Progress towards PT goals: Progressing toward goals    Frequency    Min 3X/week      PT Plan Current plan remains  appropriate    Co-evaluation              AM-PAC PT "6 Clicks" Daily Activity  Outcome Measure  Difficulty turning over in bed (including adjusting bedclothes, sheets and blankets)?: Unable Difficulty moving from lying on back to sitting on the side of the bed? : Unable Difficulty sitting down on and standing up from a chair with arms (e.g., wheelchair, bedside commode, etc,.)?: Unable Help needed moving to and from a bed to chair (including a wheelchair)?: A Lot Help needed walking in hospital room?: A Lot Help needed climbing 3-5 steps with a railing? : A Lot 6 Click Score: 9    End of Session   Activity Tolerance: Patient tolerated treatment well;Patient limited by lethargy Patient left: in chair;with call bell/phone within reach;with chair alarm set;with family/visitor present Nurse Communication: Mobility status;Precautions PT Visit Diagnosis: Other symptoms and signs involving the nervous system (R29.898);Other abnormalities of gait and mobility (R26.89);Muscle weakness (generalized) (M62.81)     Time: 9688-6484 PT Time Calculation (min) (ACUTE ONLY): 24 min  Charges:  $Gait Training: 8-22 mins $Therapeutic Activity: 8-22 mins                     12/05/2017  Sheryl James, PT Acute Rehabilitation Services [phone]  (pager) [phone]  (office)   Sheryl James 12/05/2017, 12:03 PM

## 2017-12-05 NOTE — Progress Notes (Signed)
Upon assessment, patient's EVD drain was laying in the bed. Patient remains neuro intact. On call Neurosurgery Pershing Cox NP was notified. RN will continue to monitor.

## 2017-12-05 NOTE — Progress Notes (Signed)
Patient ID: Sheryl James, female   DOB: Nov 06, 1955, 62 y.o.   MRN: [id number] BP (!) 159/80   Pulse 73   Temp 98.1 F (36.7 C) (Axillary)   Resp (!) 22   Ht 5\' 2"  (1.575 m)   Wt 70.8 kg   SpO2 95%   BMI 28.55 kg/m  Alert, following commands Head ct shows continued enlarged ventricles She is moving all extremities I will close the exit site. Mrs. Belkin may need the ventricular catheter replaced, but for now I will hold off.

## 2017-12-06 ENCOUNTER — Inpatient Hospital Stay (HOSPITAL_COMMUNITY): Payer: 59

## 2017-12-06 NOTE — Op Note (Signed)
   11/26/2017 - 11/27/2017  PATIENT:   Sheryl James  62 y.o. female   PRE-OPERATIVE DIAGNOSIS: obstructive hydrocephalus  POST-OPERATIVE DIAGNOSIS:  Same  PROCEDURE:  right frontal ventricular catheter placement  SURGEON:  Ahmar Pickrell ANESTHESIA:   local DRAINS: Ventriculostomy Drain in the right lateral ventricle.   SPECIMEN: none  DICTATION: Sheryl James had female  head shaved and then prepared in a sterile manner. I draped female  in a sterile manner. I injected lidocaine into the planned coronal incision in line with the right pupillary line anterior to the tragus. I opened the skin with a 15 blade. I used the hand twist drill to create a burr hole. I opened the dura with the 20 gauge spinal needle. I passed the ventricular catheter into the lateral ventricle. There was brisk flow of spinal fluid. I tunneled the catheter posteriorly and secured it to the skin at the exit. I approximated the scalp edges with nylon sutures. I placed a sterile dressing, then connected the catheter to the drainage system.   PLAN OF CARE: Admit to inpatient   PATIENT DISPOSITION:  PACU - hemodynamically stable.

## 2017-12-07 NOTE — Progress Notes (Signed)
Patient ID: Sheryl James, female   DOB: 09/16/1955, 62 y.o.   MRN: [id number] BP (!) 168/78   Pulse 92   Temp 99 F (37.2 C) (Axillary)   Resp (!) 28   Ht 5\' 2"  (1.575 m)   Wt 70.8 kg   SpO2 99%   BMI 28.55 kg/m  Will drop ventric to 10cm h2o, lethargic, arouses easily to voice. Would like to see if this makes a difference Moving all extremities

## 2017-12-08 ENCOUNTER — Encounter (HOSPITAL_COMMUNITY): Payer: Self-pay | Admitting: Neurosurgery

## 2017-12-08 ENCOUNTER — Inpatient Hospital Stay (HOSPITAL_COMMUNITY): Payer: 59

## 2017-12-08 DIAGNOSIS — I609 Nontraumatic subarachnoid hemorrhage, unspecified: Secondary | ICD-10-CM

## 2017-12-08 MED ORDER — GERHARDT'S BUTT CREAM
TOPICAL_CREAM | Freq: Two times a day (BID) | CUTANEOUS | Status: DC
  Administered 2017-12-09 – 2017-12-18 (×21): via TOPICAL
  Administered 2017-12-19: 1 via TOPICAL
  Administered 2017-12-19 – 2017-12-20 (×2): via TOPICAL
  Administered 2017-12-20: 1 via TOPICAL
  Administered 2017-12-21 – 2017-12-25 (×6): via TOPICAL
  Administered 2017-12-26: 1 via TOPICAL
  Filled 2017-12-08 (×2): qty 1

## 2017-12-08 NOTE — Progress Notes (Signed)
  Speech Language Pathology Treatment: Cognitive-Linquistic  Patient Details Name: Sheryl James MRN: [id number] DOB: 02-25-1956 Today's Date: 12/08/2017 Time: 1445-1510 SLP Time Calculation (min) (ACUTE ONLY): 25 min  Assessment / Plan / Recommendation Clinical Impression  Pt seen for skilled ST intervention focusing on goals for improved attention. Pt's friend Sheryl James was present, and assisted SLP in encouraging pt to participate. Pt able to track SLP across the room, sustaining attention/eye contact for 15 seconds. Pt verbal responses were highly variable. Pt would respond immediately ("what" in response to calling her name) at times, and at other times pt would stare ahead and not respond at all. Pt answered yes/no questions more frequently than open ended questions (what is your husband's name, tell me your name), and did not respond to request to count 1-5. Sheryl James continually encouraged pt to participate, and indicated pt was highly intelligent and very witty prior to hospitalization. Pt exhibited flat affect throughout this session. Continued skilled ST intervention targeting attention and decreased response time is recommended. Pt was encouraged to participate in all therapies acutely to increase endurance, educating pt/visitors regarding 3 hours of therapy in inpatient rehab.     HPI HPI: 62 y.o. female with recent HAs dx with 10.7 x6 x 59mm basilar apex aneurysm, s/p coiling 10/3, IVC. Coughing with meds s/p procedure and SLP ordered on 10/3. Results of BSE on 10/4 were normal swallow function without overt s/s of aspiration or penetration and only concerns were secondary to lethargy. Per RN, patient coughing with medications and unable to swallow pills and so a new BSE was ordered on 10/5.       SLP Plan  Continue with current plan of care       Recommendations   continued skilled ST intervention acutely and at next level of care.                General recommendations: Rehab  consult Oral Care Recommendations: Oral care BID Follow up Recommendations: Inpatient Rehab SLP Visit Diagnosis: Cognitive communication deficit (H15.056) Plan: Continue with current plan of care       GO              Shaquel Josephson B. Quentin Ore [facility], CCC-SLP Speech Language Pathologist [phone]  Shonna Chock 12/08/2017, 3:11 PM

## 2017-12-08 NOTE — Progress Notes (Signed)
Patient ID: Sheryl James, female   DOB: November 22, 1955, 62 y.o.   MRN: [id number] BP (!) 182/70   Pulse 95   Temp 98.1 F (36.7 C) (Oral)   Resp (!) 28   Ht 5\' 2"  (1.575 m)   Wt 70.8 kg   SpO2 98%   BMI 28.55 kg/m  Alert this evening. Moving all extremities Perrl,symmetric facies Doing better since ventricular catheter was lowered Will probably need shunting, but have time to sort it out.

## 2017-12-08 NOTE — Progress Notes (Signed)
Transcranial Doppler  Date POD PCO2 HCT BP  MCA ACA PCA OPHT SIPH VERT Basilar  10/4 JE     Right  Left   53  *   -41  *   *  -67   41  27   168  106   *  *   *      10/4 VS     Right  Left   *  *   *  *   *  *   29  21   ?55  *   *  *   *  *    10/9 MS     Right  Left   13  15   -73  -78   35  *   31  26   31   79   -27  -31   -31      10/11 MS      Right  Left   *  40   *  -16   *  33   27  18   48  41   *  *   *      10/14 JE      Right  Left   47  *   -30  *   -42  68   34  19   90  67   -14  -31   -34           Right  Left                                            Right  Left                                        MCA = Middle Cerebral Artery      OPHT = Opthalmic Artery     BASILAR = Basilar Artery   ACA = Anterior Cerebral Artery     SIPH = Carotid Siphon PCA = Posterior Cerebral Artery   VERT = Verterbral Artery                   Normal MCA = 62+\-12 ACA = 50+\-12 PCA = 42+\-23   * = not insonated/ unable to obtain 11/28/17 Sheryl James, RDMS, RVT * = Not insonated unable to obtain Severely technically limited due to poor windows, constant turning of the head , and respiratory interference - heavy snoring 12/01/2017 Sheryl James,RVS  Rt Lindegaard ratio=0.34 Lt Lindegaard ratio= 0.47  *Study was technically limited and difficult due to poor acoustic windows and poor patient cooperation. 12/05/17   Atypical right vertebral waveform noted. * = not insonated. Sheryl James, RDMS, RVT 12/08/2017

## 2017-12-08 NOTE — Progress Notes (Signed)
Physical Therapy Treatment Patient Details Name: Sheryl James MRN: [id number] DOB: [birth year] Today's Date: 12/08/2017    History of Present Illness 62 y.o. female with recent HAs dx with 10.7 x6 x 77mm basilar apex aneurysm, s/p coiling 10/3, IVC.     PT Comments    Pt with improved ambulation tolerance this date of 55' feet with RW and maxAx2. Pt remains to have impaired processing, sequencing, orientation, and comprehension. Pt requiring mod/maxAx2 for all mobility. Cont to recommend CIR upon d/c for maximal functional return.    Follow Up Recommendations  CIR;Supervision/Assistance - 24 hour     Equipment Recommendations  Other (comment)(TBD)    Recommendations for Other Services Rehab consult     Precautions / Restrictions Precautions Precautions: Fall Precaution Comments: EVD must be clamped Restrictions Weight Bearing Restrictions: No    Mobility  Bed Mobility Overal bed mobility: Needs Assistance Bed Mobility: Rolling;Sidelying to Sit;Sit to Sidelying Rolling: Mod assist;Max assist;+2 for safety/equipment Sidelying to sit: Max assist;+2 for physical assistance     Sit to sidelying: Mod assist;Max assist;+2 for physical assistance;+2 for safety/equipment General bed mobility comments: pt reached for PT to roll however didnt initiate LE movement. maxA to achieve L sidelying and to go into sitting. pt with retropulsion. pt maxA to return to supine as well. modA for trunk control during descent and maxA for LE management back into bed  Transfers Overall transfer level: Needs assistance Equipment used: Rolling walker (2 wheeled) Transfers: Sit to/from Stand Sit to Stand: Max assist;+2 physical assistance Stand pivot transfers: Max assist;+2 physical assistance       General transfer comment: max directional verbal cues, modA to advance R LE to step during std pvt to [facility] and then to bed   Ambulation/Gait Ambulation/Gait assistance: Mod assist;Max assist;+2  physical assistance;+2 safety/equipment Gait Distance (Feet): 50 Feet Assistive device: Rolling walker (2 wheeled) Gait Pattern/deviations: Step-to pattern;Decreased stride length;Decreased stance time - right Gait velocity: slow Gait velocity interpretation: <1.8 ft/sec, indicate of risk for recurrent falls General Gait Details: very slow, maxA to weight shift L/R, pt with decreased R foot clearance and only weightbearing on the R forefoot. maxA to achieve R knee extension   Stairs             Wheelchair Mobility    Modified Rankin (Stroke Patients Only) Modified Rankin (Stroke Patients Only) Pre-Morbid Rankin Score: No symptoms Modified Rankin: Moderately severe disability     Balance Overall balance assessment: Needs assistance Sitting-balance support: Single extremity supported;Bilateral upper extremity supported Sitting balance-Leahy Scale: Poor Sitting balance - Comments: pt with retropulsion   Standing balance support: Bilateral upper extremity supported Standing balance-Leahy Scale: Poor Standing balance comment: AD and external support                            Cognition Arousal/Alertness: Lethargic Behavior During Therapy: Flat affect Overall Cognitive Status: Impaired/Different from baseline Area of Impairment: Orientation;Attention;Memory;Following commands;Safety/judgement;Awareness;Problem solving                 Orientation Level: Disoriented to;Place;Time;Situation Current Attention Level: Focused Memory: Decreased recall of precautions;Decreased short-term memory Following Commands: Follows one step commands inconsistently Safety/Judgement: Decreased awareness of safety;Decreased awareness of deficits Awareness: Emergent(stated she had to urinate) Problem Solving: Slow processing;Decreased initiation;Difficulty sequencing;Requires verbal cues;Requires tactile cues General Comments: pt stated name of dog, "Ovid Curd" however did not  hold conversation or answer any other questions but did state "I have to pee". pt  with extremely delayed processing and comprehension requiring max verbal and tactile direcitonal v/c's      Exercises      General Comments General comments (skin integrity, edema, etc.): pt with urine and loose stool in bed, dependent for hygiene, pt very red and irritated in peri area from freq loose stool incontinence despite having flexiseal      Pertinent Vitals/Pain Pain Assessment: Faces Faces Pain Scale: Hurts little more Pain Location: grimacing during pericare Pain Descriptors / Indicators: Grimacing Pain Intervention(s): Monitored during session    Home Living                      Prior Function            PT Goals (current goals can now be found in the care plan section) Progress towards PT goals: Progressing toward goals    Frequency    Min 3X/week      PT Plan Current plan remains appropriate    Co-evaluation              AM-PAC PT "6 Clicks" Daily Activity  Outcome Measure  Difficulty turning over in bed (including adjusting bedclothes, sheets and blankets)?: Unable Difficulty moving from lying on back to sitting on the side of the bed? : Unable Difficulty sitting down on and standing up from a chair with arms (e.g., wheelchair, bedside commode, etc,.)?: Unable Help needed moving to and from a bed to chair (including a wheelchair)?: A Lot Help needed walking in hospital room?: A Lot Help needed climbing 3-5 steps with a railing? : Total 6 Click Score: 8    End of Session Equipment Utilized During Treatment: Oxygen Activity Tolerance: Patient tolerated treatment well;Patient limited by lethargy Patient left: in bed;with call bell/phone within reach;with family/visitor present(due to having carotid doplers being done) Nurse Communication: Mobility status;Precautions PT Visit Diagnosis: Other symptoms and signs involving the nervous system (R29.898);Other  abnormalities of gait and mobility (R26.89);Muscle weakness (generalized) (M62.81)     Time: 5638-7564 PT Time Calculation (min) (ACUTE ONLY): 38 min  Charges:  $Gait Training: 8-22 mins $Therapeutic Activity: 23-37 mins                     Kittie Plater, PT, DPT Acute Rehabilitation Services Pager #: [phone] Office #: [phone]    Berline Lopes 12/08/2017, 10:51 AM

## 2017-12-09 NOTE — Plan of Care (Signed)
Pt tolerating reg diet. Appetite remains fair but pt will eat and take in fluids well with encouragement.

## 2017-12-09 NOTE — Progress Notes (Signed)
OT Cancellation Note  Patient Details Name: Sheryl James MRN: [id number] DOB: January 16, 1956   Cancelled Treatment:    Reason Eval/Treat Not Completed: Other (comment). Per nursing, pt just fell asleep and requested OT not wake her at this time. Will follow up tomorow as able.   Ramond Dial, OT/L   Acute OT Clinical Specialist Acute Rehabilitation Services Pager [phone] Office [phone]  12/09/2017, 4:15 PM

## 2017-12-09 NOTE — Progress Notes (Signed)
Physical Therapy Treatment Patient Details Name: Sheryl James MRN: [id number] DOB: [birth year] Today's Date: 12/09/2017    History of Present Illness 62 y.o. female with recent HAs dx with 10.7 x6 x 46mm basilar apex aneurysm, s/p coiling 10/3, IVC.     PT Comments    Pt cont to progress from ambulation tolerance stand point. Pt more verbal today as well. Pt cont to have impaired processing, sequencing, proprioception, and command following. Pt cont to require assistx2 for safe mobility. R LE coordination improved from yesterday but cont to require assist for safe transfers and ambulation.    Follow Up Recommendations  CIR;Supervision/Assistance - 24 hour     Equipment Recommendations  (TBD)    Recommendations for Other Services Rehab consult     Precautions / Restrictions Precautions Precautions: Fall Precaution Comments: EVD must be clamped Restrictions Weight Bearing Restrictions: No    Mobility  Bed Mobility Overal bed mobility: Needs Assistance Bed Mobility: Supine to Sit     Supine to sit: Mod assist;+2 for physical assistance     General bed mobility comments: pt with minimal initiation for transfer, modA for LE management and trunk elevation management  Transfers Overall transfer level: Needs assistance Equipment used: Rolling walker (2 wheeled) Transfers: Sit to/from Stand Sit to Stand: Mod assist;+2 physical assistance;+2 safety/equipment         General transfer comment: max directional v/c's to push up, modAx2 to power up/initiate standing, pt with retropulsion in sitting  Ambulation/Gait Ambulation/Gait assistance: Mod assist;Max assist;+2 safety/equipment Gait Distance (Feet): 100 Feet Assistive device: Rolling walker (2 wheeled) Gait Pattern/deviations: Decreased stride length;Step-through pattern;Decreased dorsiflexion - right;Decreased weight shift to right Gait velocity: slow Gait velocity interpretation: <1.8 ft/sec, indicate of risk for  recurrent falls General Gait Details: PT assisted pt to R knee terminal extension and to achieve R foot flat on floor, modA initially to advance R LE during ambulation then transitioned to min guard. with onset of fatigue pt with noted bilat knee buckling   Stairs             Wheelchair Mobility    Modified Rankin (Stroke Patients Only) Modified Rankin (Stroke Patients Only) Pre-Morbid Rankin Score: No symptoms Modified Rankin: Moderately severe disability     Balance Overall balance assessment: Needs assistance Sitting-balance support: Single extremity supported;Bilateral upper extremity supported Sitting balance-Leahy Scale: Poor Sitting balance - Comments: pt with retropulsion   Standing balance support: Bilateral upper extremity supported Standing balance-Leahy Scale: Poor Standing balance comment: AD and external support                            Cognition Arousal/Alertness: Awake/alert Behavior During Therapy: Flat affect Overall Cognitive Status: Impaired/Different from baseline Area of Impairment: Orientation;Attention;Memory;Following commands;Safety/judgement;Awareness;Problem solving                 Orientation Level: Disoriented to;Place;Time;Situation Current Attention Level: Focused Memory: Decreased recall of precautions;Decreased short-term memory Following Commands: Follows one step commands inconsistently Safety/Judgement: Decreased awareness of safety;Decreased awareness of deficits Awareness: Emergent Problem Solving: Slow processing;Decreased initiation;Difficulty sequencing;Requires verbal cues;Requires tactile cues General Comments: pt a little more vocal today      Exercises Other Exercises Other Exercises: worked on sit to stand x2 from chair, modA for R LE placement , modA to power up    General Comments General comments (skin integrity, edema, etc.): VSS      Pertinent Vitals/Pain Pain Assessment: No/denies  pain Pain Location: grimacing with  repositioning Pain Descriptors / Indicators: Grimacing Pain Intervention(s): Monitored during session    Home Living                      Prior Function            PT Goals (current goals can now be found in the care plan section) Progress towards PT goals: Progressing toward goals    Frequency    Min 3X/week      PT Plan Current plan remains appropriate    Co-evaluation              AM-PAC PT "6 Clicks" Daily Activity  Outcome Measure  Difficulty turning over in bed (including adjusting bedclothes, sheets and blankets)?: Unable Difficulty moving from lying on back to sitting on the side of the bed? : Unable Difficulty sitting down on and standing up from a chair with arms (e.g., wheelchair, bedside commode, etc,.)?: Unable Help needed moving to and from a bed to chair (including a wheelchair)?: A Lot Help needed walking in hospital room?: A Lot Help needed climbing 3-5 steps with a railing? : Total 6 Click Score: 8    End of Session Equipment Utilized During Treatment: Oxygen Activity Tolerance: Patient tolerated treatment well;Patient limited by lethargy Patient left: in chair;with call bell/phone within reach;with chair alarm set;with family/visitor present Nurse Communication: Mobility status;Precautions PT Visit Diagnosis: Other symptoms and signs involving the nervous system (R29.898);Other abnormalities of gait and mobility (R26.89);Muscle weakness (generalized) (M62.81)     Time: 7741-4239 PT Time Calculation (min) (ACUTE ONLY): 30 min  Charges:  $Gait Training: 8-22 mins $Therapeutic Activity: 8-22 mins                     Kittie Plater, PT, DPT Acute Rehabilitation Services Pager #: [phone] Office #: [phone]    Berline Lopes 12/09/2017, 1:52 PM

## 2017-12-09 NOTE — Progress Notes (Signed)
Patient ID: Sheryl James, female   DOB: 1955/09/10, 62 y.o.   MRN: [id number] BP (!) 171/69   Pulse 86   Temp 98.4 F (36.9 C) (Oral)   Resp (!) 23   Ht 5\' 2"  (1.575 m)   Wt 70.8 kg   SpO2 98%   BMI 28.55 kg/m  Alert and oriented to person [facility] today with pt, has been carrying on a conversation with her husband Doing better with lower pressure.

## 2017-12-10 ENCOUNTER — Inpatient Hospital Stay (HOSPITAL_COMMUNITY): Payer: 59

## 2017-12-10 DIAGNOSIS — I609 Nontraumatic subarachnoid hemorrhage, unspecified: Secondary | ICD-10-CM

## 2017-12-10 NOTE — Progress Notes (Signed)
Transcranial Doppler  Date POD PCO2 HCT BP  MCA ACA PCA OPHT SIPH VERT Basilar  10/4 JE     Right  Left   53  *   -41  *   *  -67   41  27   168  106   *  *   *      10/4 VS     Right  Left   *  *   *  *   *  *   29  21   ?55  *   *  *   *  *    10/9 MS     Right  Left   13  15   -73  -78   35  *   31  26   31   79   -27  -31   -31      10/11 MS      Right  Left   *  40   *  -16   *  33   27  18   48  41   *  *   *      10/14 JE      Right  Left   47  *   -30  *   -35  40   32  18   90  67   -14  -31   -34      10/16   JE     Right  Left   43  *   -32  *   -33  -35   28  21   66  41   -14  -20   -23           Right  Left                                        MCA = Middle Cerebral Artery      OPHT = Opthalmic Artery     BASILAR = Basilar Artery   ACA = Anterior Cerebral Artery     SIPH = Carotid Siphon PCA = Posterior Cerebral Artery   VERT = Verterbral Artery                   Normal MCA = 62+\-12 ACA = 50+\-12 PCA = 42+\-23   * = not insonated/ unable to obtain 11/28/17 Landry Mellow, RDMS, RVT * = Not insonated unable to obtain Severely technically limited due to poor windows, constant turning of the head , and respiratory interference - heavy snoring 12/01/2017 Virginia Slaughter,RVS  Rt [place] ratio=0.34 Lt [place] ratio= 0.47  *Study was technically limited and difficult due to poor acoustic windows and poor patient cooperation. 12/05/17   Atypical right vertebral waveform noted. * = not insonated. Landry Mellow, RDMS, RVT 12/10/2017

## 2017-12-10 NOTE — Progress Notes (Signed)
Dr. Christella Noa called at 1250 re: change in pt's mental status. Pt would not open eyes to noxious stimuli, but would grunt in response. Pupils equal and reactive. When MD called back at 1300, pt was opening eyes spontaneously but not responding to orientation questions. Will continue to monitor.

## 2017-12-10 NOTE — Progress Notes (Signed)
TCD [facility] protocol of 6 exams performed as of 12/10/2017.   Please advise if another TCD is required for 12/12/17. Cancel order or call vascular lab [place], RDMS, RVT

## 2017-12-10 NOTE — Progress Notes (Signed)
Occupational Therapy Treatment Patient Details Name: Sheryl James MRN: [id number] DOB: Aug 31, 1955 Today's Date: 12/10/2017    History of present illness 62 y.o. 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    OT comments  Pt lethargic upon entry into room, increased alertness when exiting bed.  Bed mobility with mod assist +2, with second person assisting with line management and safety.  Patient continues to present with strong retropulsion bias in sitting and standing, requiring multimodal cueing for midline posture; improved in standing with using RW.  Requires increased time/cueing for initiation, sequencing, and motor planning throughout session. Continues to require +2 mod assist for toilet transfers and total assist for toileting. DC plan remains appropriate.  Will continue to follow.    Follow Up Recommendations  CIR;Supervision/Assistance - 24 hour    Equipment Recommendations  3 in 1 bedside commode    Recommendations for Other Services Rehab consult    Precautions / Restrictions Precautions Precautions: Fall Precaution Comments: EVD must be clamped Restrictions Weight Bearing Restrictions: No       Mobility Bed Mobility Overal bed mobility: Needs Assistance Bed Mobility: Supine to Sit     Supine to sit: Max assist;HOB elevated     General bed mobility comments: pt requires multimodal cueing to engage in task, poor initation and posterior lean   Transfers Overall transfer level: Needs assistance Equipment used: Rolling walker (2 wheeled) Transfers: Sit to/from [facility] Sit to Stand: Mod assist;+2 physical assistance;+2 safety/equipment Stand pivot transfers: Max assist;+2 physical assistance       General transfer comment: max cueing for seqeuncing, intiation and motor planning, strong retropulsion    Balance Overall balance assessment: Needs assistance Sitting-balance support: No upper  extremity supported;Feet supported Sitting balance-Leahy Scale: Poor Sitting balance - Comments: pt with retropulsion/ posterior bias   Standing balance support: Bilateral upper extremity supported Standing balance-Leahy Scale: Poor Standing balance comment: AD and external support                           ADL either performed or assessed with clinical judgement   ADL Overall ADL's : Needs assistance/impaired                     Lower Body Dressing: Total assistance;Bed level Lower Body Dressing Details (indicate cue type and reason): total assist to don socks supine  Toilet Transfer: Moderate assistance;+2 for physical assistance;+2 for safety/equipment;Stand-pivot;BSC;RW Toilet Transfer Details (indicate cue type and reason): to 3:1 commode with modA +2 given multimodal cueing using RW  Toileting- Clothing Manipulation and Hygiene: Total assistance;+2 for physical assistance;Sit to/from stand       Functional mobility during ADLs: Moderate assistance;+2 for physical assistance;Rolling walker;+2 for safety/equipment(func mobility with chair follow, mod assist using RW ) General ADL Comments: slow processing, posterior lean, and poor attention        Vision       Perception     Praxis      Cognition Arousal/Alertness: Awake/alert Behavior During Therapy: Flat affect Overall Cognitive Status: Impaired/Different from baseline Area of Impairment: Orientation;Attention;Memory;Following commands;Safety/judgement;Awareness;Problem solving                 Orientation Level: Disoriented to;Time;Situation Current Attention Level: Focused Memory: Decreased short-term memory Following Commands: Follows one step commands inconsistently;Follows one step commands with increased time Safety/Judgement: Decreased awareness of safety;Decreased awareness of deficits Awareness: Intellectual Problem Solving:  Slow processing;Decreased initiation;Difficulty  sequencing;Requires verbal cues;Requires tactile cues General Comments: requires greatly increased time for processing, initation and motor planning         Exercises     Shoulder Instructions       General Comments VSS    Pertinent Vitals/ Pain       Pain Assessment: No/denies pain  Home Living                                          Prior Functioning/Environment              Frequency  Min 2X/week        Progress Toward Goals  OT Goals(current goals can now be found in the care plan section)  Progress towards OT goals: Progressing toward goals  Acute Rehab OT Goals Patient Stated Goal: pt did not state a goal. OT Goal Formulation: With patient/family Time For Goal Achievement: 12/12/17 Potential to Achieve Goals: Good ADL Goals Pt Will Perform Grooming: with min assist;sitting  Plan Discharge plan remains appropriate    Co-evaluation                 AM-PAC PT "6 Clicks" Daily Activity     Outcome Measure   Help from another person eating meals?: A Lot Help from another person taking care of personal grooming?: A Lot Help from another person toileting, which includes using toliet, bedpan, or urinal?: Total Help from another person bathing (including washing, rinsing, drying)?: A Lot Help from another person to put on and taking off regular upper body clothing?: A Lot Help from another person to put on and taking off regular lower body clothing?: Total 6 Click Score: 10    End of Session Equipment Utilized During Treatment: Gait belt;Rolling walker  OT Visit Diagnosis: Unsteadiness on feet (R26.81);Muscle weakness (generalized) (M62.81);Other symptoms and signs involving cognitive function   Activity Tolerance Patient tolerated treatment well   Patient Left with call bell/phone within reach;with family/visitor present;in chair   Nurse Communication Mobility status        Time: 1611-1700 OT Time Calculation (min): 49  min  Charges: OT General Charges $OT Visit: 1 Visit OT Treatments $Self Care/Home Management : 38-52 mins  Delight Stare, [employer] Pager [phone] Office [phone]    Delight Stare 12/10/2017, 5:28 PM

## 2017-12-10 NOTE — Progress Notes (Signed)
Physical Therapy Treatment Patient Details Name: Sheryl James MRN: [id number] DOB: [birth year] Today's Date: 12/10/2017    History of Present Illness 62 y.o. female with recent HAs dx with 10.7 x6 x 58mm basilar apex aneurysm, s/p coiling 10/3, IVC.     PT Comments    Pt treatment limited today by frequent episodes of urinary and stool incontinence. Pt with strong retropulsion today in sitting and standing. Pt cont to demo impaired sequencing, following commands, and initiation. Pt with noted R side weaker than the L. Pt cont to require assistx2 for all out of mobility.   Follow Up Recommendations  CIR;Supervision/Assistance - 24 hour     Equipment Recommendations       Recommendations for Other Services Rehab consult     Precautions / Restrictions Precautions Precautions: Fall Precaution Comments: EVD must be clamped Restrictions Weight Bearing Restrictions: No    Mobility  Bed Mobility Overal bed mobility: Needs Assistance Bed Mobility: Supine to Sit     Supine to sit: Mod assist;+2 for physical assistance     General bed mobility comments: pt with no initiation of task requiring verbal and tactile cues to complete, pt with strong retropulsion  Transfers Overall transfer level: Needs assistance Equipment used: Rolling walker (2 wheeled) Transfers: Sit to/from [facility] Sit to Stand: Mod assist;+2 physical assistance;+2 safety/equipment Stand pivot transfers: Max assist;+2 physical assistance       General transfer comment: max directional verbal cues, pt with strong retropulsion requiring max verbal and tactile cues to come to center  Ambulation/Gait Ambulation/Gait assistance: Max assist;+2 physical assistance Gait Distance (Feet): 100 Feet Assistive device: Rolling walker (2 wheeled) Gait Pattern/deviations: Decreased stride length;Step-through pattern;Decreased dorsiflexion - right;Decreased weight shift to right Gait velocity: slow Gait  velocity interpretation: <1.8 ft/sec, indicate of risk for recurrent falls General Gait Details: pt with improved R LE sequencing however then started walking on L ball of foot. Pt almost with noted flexor tone in R LE. When providing tactile cues for optimal R LE kinematics pt with strong hamstring resistance and R LE external rotation, pt with decreased R step height and cont to have strong posterior lean despite max verbal and tactile cues. Attempted to use bilat HHA insteady of walker however pt did not utilize hands to push down in and requiring total assist to [employer]             Wheelchair Mobility    Modified Rankin (Stroke Patients Only) Modified Rankin (Stroke Patients Only) Pre-Morbid Rankin Score: No symptoms Modified Rankin: Moderately severe disability     Balance Overall balance assessment: Needs assistance Sitting-balance support: Single extremity supported;Bilateral upper extremity supported Sitting balance-Leahy Scale: Poor     Standing balance support: Bilateral upper extremity supported Standing balance-Leahy Scale: Poor Standing balance comment: AD and external support                            Cognition Arousal/Alertness: Awake/alert Behavior During Therapy: Flat affect Overall Cognitive Status: Impaired/Different from baseline Area of Impairment: Orientation;Attention;Memory;Following commands;Safety/judgement;Awareness;Problem solving                 Orientation Level: Disoriented to;Time;Situation Current Attention Level: Focused Memory: Decreased short-term memory Following Commands: Follows one step commands inconsistently;Follows one step commands with increased time Safety/Judgement: Decreased awareness of safety;Decreased awareness of deficits Awareness: Intellectual(both urinary and stool incontinence) Problem Solving: Slow processing;Decreased initiation;Difficulty sequencing;Requires verbal cues;Requires tactile  cues General Comments:  pt with 2 episodes of urinary incontenence and 1 of stool despite flexiseal      Exercises Other Exercises Other Exercises: completed several sit to stand for hygiene due to freq episodes of incontinence    General Comments General comments (skin integrity, edema, etc.): vss      Pertinent Vitals/Pain Pain Assessment: No/denies pain Pain Location: grimacing with forward flexion    Home Living                      Prior Function            PT Goals (current goals can now be found in the care plan section) Progress towards PT goals: Progressing toward goals    Frequency    Min 3X/week      PT Plan Current plan remains appropriate    Co-evaluation              AM-PAC PT "6 Clicks" Daily Activity  Outcome Measure  Difficulty turning over in bed (including adjusting bedclothes, sheets and blankets)?: Unable Difficulty moving from lying on back to sitting on the side of the bed? : Unable Difficulty sitting down on and standing up from a chair with arms (e.g., wheelchair, bedside commode, etc,.)?: Unable Help needed moving to and from a bed to chair (including a wheelchair)?: A Lot Help needed walking in hospital room?: A Lot Help needed climbing 3-5 steps with a railing? : Total 6 Click Score: 8    End of Session Equipment Utilized During Treatment: Oxygen Activity Tolerance: Patient tolerated treatment well;Patient limited by lethargy Patient left: in chair;with call bell/phone within reach;with chair alarm set;with family/visitor present Nurse Communication: Mobility status;Precautions PT Visit Diagnosis: Other symptoms and signs involving the nervous system (R29.898);Other abnormalities of gait and mobility (R26.89);Muscle weakness (generalized) (M62.81)     Time: 4315-4008 PT Time Calculation (min) (ACUTE ONLY): 53 min  Charges:  $Gait Training: 8-22 mins $Therapeutic Activity: 38-52 mins                     Kittie Plater, PT, DPT Acute Rehabilitation Services Pager #: [phone] Office #: [phone]    Berline Lopes 12/10/2017, 2:09 PM

## 2017-12-11 LAB — URINALYSIS, ROUTINE W REFLEX MICROSCOPIC
BILIRUBIN URINE: NEGATIVE
Glucose, UA: NEGATIVE mg/dL
KETONES UR: NEGATIVE mg/dL
NITRITE: POSITIVE — AB
PH: 6 (ref 5.0–8.0)
Protein, ur: NEGATIVE mg/dL
SPECIFIC GRAVITY, URINE: 1.008 (ref 1.005–1.030)

## 2017-12-11 LAB — BASIC METABOLIC PANEL
ANION GAP: 10 (ref 5–15)
BUN: <5 mg/dL — ABNORMAL LOW (ref 8–23)
CHLORIDE: 105 mmol/L (ref 98–111)
CO2: 23 mmol/L (ref 22–32)
Calcium: 8.8 mg/dL — ABNORMAL LOW (ref 8.9–10.3)
Creatinine, Ser: 0.59 mg/dL (ref 0.44–1.00)
GFR calc non Af Amer: >60 mL/min (ref >60)
Glucose, Bld: 98 mg/dL (ref 70–99)
Potassium: 3.8 mmol/L (ref 3.5–5.1)
SODIUM: 138 mmol/L (ref 135–145)

## 2017-12-11 LAB — CBC
HEMATOCRIT: 35.4 % — AB (ref 36.0–46.0)
HEMOGLOBIN: 11.5 g/dL — AB (ref 12.0–15.0)
MCH: 30.1 pg (ref 26.0–34.0)
MCHC: 32.5 g/dL (ref 30.0–36.0)
MCV: 92.7 fL (ref 80.0–100.0)
NRBC: 0 % (ref 0.0–0.2)
Platelets: 469 10*3/uL — ABNORMAL HIGH (ref 150–400)
RBC: 3.82 MIL/uL — AB (ref 3.87–5.11)
RDW: 14.6 % (ref 11.5–15.5)
WBC: 8.4 10*3/uL (ref 4.0–10.5)

## 2017-12-11 NOTE — Progress Notes (Signed)
SLP Cancellation Note  Patient Details Name: Sheryl James MRN: [id number] DOB: [birth year]   Cancelled treatment:       Reason Eval/Treat Not Completed: Fatigue/lethargy limiting ability to participate.  Attempted to see x2 this afternoon, pt too sleepy to participate.  D/W husband.  Will continue efforts.    [name] 12/11/2017, 3:38 PM

## 2017-12-11 NOTE — Progress Notes (Signed)
  NEUROSURGERY PROGRESS NOTE   No issues overnight. Was very awake and interactive this am, sleeping now. Required I/O cath for urinary retention today  EXAM:  BP 102/60   Pulse 73   Temp 97.6 F (36.4 C) (Oral)   Resp (!) 23   Ht 5\' 2"  (1.575 m)   Wt 70.8 kg   SpO2 98%   BMI 28.55 kg/m   Somnolent but easily aroused Speech fluent CN grossly intact  Strength symmetric EVD in place, straw colored CSF  IMPRESSION:  62 y.o. female SAH d# 16 s/p basilar aneurysm coiling, remains neurologically stable, appears shunt dependent.  PLAN: - Will check UA at next void or I/O, and check CBC/BMP - Cont EVD at 10cmH20, will need VPS likely next week

## 2017-12-11 NOTE — Progress Notes (Signed)
Physical Therapy Treatment Patient Details Name: Sheryl James MRN: [id number] DOB: [birth year] Today's Date: 12/11/2017    History of Present Illness 62 y.o. 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     PT Comments    Pt was able to walk around the unit despite initial fatigue.  Pt's husband reports she is much more alert in the morning and that we should arrange to see her for treatment in the morning if we can.  Pt was able to walk a bit further with one seated rest break, but as she fatigue her R LE weakness increases.  Pt remains appropriate for CIR level therapies at discharge.  Goals are due to be checked/updated tomorrow.   Follow Up Recommendations  CIR;Supervision/Assistance - 24 hour     Equipment Recommendations  Rolling walker with 5" wheels    Recommendations for Other Services Rehab consult     Precautions / Restrictions Precautions Precautions: Fall;Other (comment) Precaution Comments: EVD must be clamped    Mobility  Bed Mobility Overal bed mobility: Needs Assistance Bed Mobility: Supine to Sit     Supine to sit: Mod assist     General bed mobility comments: Mod assist to support trunk to get to sitting EOB.    Transfers Overall transfer level: Needs assistance Equipment used: Rolling walker (2 wheeled) Transfers: Sit to/from [facility] Sit to Stand: Mod assist;+2 physical assistance         General transfer comment: Mod assist to support trunk to come to standing, verbal and manual cues for safe hand placement during transitions. Continues to have posterior preference  Ambulation/Gait Ambulation/Gait assistance: Mod assist;+2 safety/equipment Gait Distance (Feet): 130 Feet(with one seated rest brek) Assistive device: Rolling walker (2 wheeled) Gait Pattern/deviations: Decreased stride length;Step-through pattern;Decreased dorsiflexion - right;Decreased weight shift to right Gait  velocity: decreased Gait velocity interpretation: 1.31 - 2.62 ft/sec, indicative of limited community ambulator General Gait Details: Pt with improved clearance of her right foot initially, however, once she fatigued, she needed assist for progressing of right foot forward as it started to drag and get caught under her.        Modified Rankin (Stroke Patients Only) Modified Rankin (Stroke Patients Only) Pre-Morbid Rankin Score: No symptoms Modified Rankin: Moderately severe disability     Balance Overall balance assessment: Needs assistance Sitting-balance support: Feet supported;Bilateral upper extremity supported Sitting balance-Leahy Scale: Poor Sitting balance - Comments: posterior preference in sitting EOB.  Min assist at trunk to ensure she did not fall backwards.    Standing balance support: Bilateral upper extremity supported Standing balance-Leahy Scale: Poor Standing balance comment: needs external support from RW and AD.                             Cognition Arousal/Alertness: Awake/alert Behavior During Therapy: Flat affect Overall Cognitive Status: Impaired/Different from baseline Area of Impairment: Orientation;Attention;Memory;Following commands;Safety/judgement;Awareness;Problem solving                 Orientation Level: (recognized husband) Current Attention Level: Sustained Memory: Decreased short-term memory Following Commands: Follows one step commands with increased time Safety/Judgement: Decreased awareness of safety;Decreased awareness of deficits Awareness: Intellectual Problem Solving: Slow processing;Decreased initiation;Difficulty sequencing;Requires verbal cues;Requires tactile cues General Comments: Pt slow to process, slow to initiate movement without multimodal cues.  Very flat affect.  Pertinent Vitals/Pain Pain Assessment: No/denies pain Pain Score: 0-No pain           PT Goals (current goals can now be  found in the care plan section) Acute Rehab PT Goals Patient Stated Goal: pt did not state a goal. Progress towards PT goals: Progressing toward goals    Frequency    Min 3X/week      PT Plan Current plan remains appropriate       AM-PAC PT "6 Clicks" Daily Activity  Outcome Measure  Difficulty turning over in bed (including adjusting bedclothes, sheets and blankets)?: Unable Difficulty moving from lying on back to sitting on the side of the bed? : Unable Difficulty sitting down on and standing up from a chair with arms (e.g., wheelchair, bedside commode, etc,.)?: Unable Help needed moving to and from a bed to chair (including a wheelchair)?: A Lot Help needed walking in hospital room?: A Lot Help needed climbing 3-5 steps with a railing? : A Lot 6 Click Score: 9    End of Session Equipment Utilized During Treatment: Gait belt Activity Tolerance: Patient limited by fatigue Patient left: in chair;with call bell/phone within reach;with chair alarm set;with family/visitor present Nurse Communication: Mobility status PT Visit Diagnosis: Other symptoms and signs involving the nervous system (R29.898);Other abnormalities of gait and mobility (R26.89);Muscle weakness (generalized) (M62.81)     Time: 2500-3704 PT Time Calculation (min) (ACUTE ONLY): 28 min  Charges:  $Gait Training: 23-37 mins           Jessice Madill B. Damen Windsor, PT, DPT  Acute Rehabilitation [phone] pager #[phone] office             12/11/2017, 5:38 PM

## 2017-12-12 ENCOUNTER — Other Ambulatory Visit (HOSPITAL_COMMUNITY): Payer: 59

## 2017-12-12 ENCOUNTER — Inpatient Hospital Stay (HOSPITAL_COMMUNITY): Payer: 59

## 2017-12-12 MED ORDER — SULFAMETHOXAZOLE-TRIMETHOPRIM 800-160 MG PO TABS
1.0000 | ORAL_TABLET | Freq: Two times a day (BID) | ORAL | Status: DC
  Administered 2017-12-12 – 2017-12-14 (×6): 1 via ORAL
  Filled 2017-12-12 (×7): qty 1

## 2017-12-12 MED ORDER — LORAZEPAM 2 MG/ML IJ SOLN
INTRAMUSCULAR | Status: AC
  Filled 2017-12-12: qty 1

## 2017-12-12 MED ORDER — TAMSULOSIN HCL 0.4 MG PO CAPS
0.4000 mg | ORAL_CAPSULE | Freq: Every day | ORAL | Status: DC
  Administered 2017-12-12 – 2017-12-25 (×14): 0.4 mg via ORAL
  Filled 2017-12-12 (×14): qty 1

## 2017-12-12 MED ORDER — SODIUM CHLORIDE 0.9 % IV BOLUS
500.0000 mL | Freq: Once | INTRAVENOUS | Status: AC
  Administered 2017-12-12: 500 mL via INTRAVENOUS

## 2017-12-12 NOTE — Progress Notes (Signed)
  NEUROSURGERY PROGRESS NOTE   Continues to have difficulties with voiding. Complains of lower abd pain. Otherwise, denies headache or focal deficits.   EXAM:  BP (!) 149/62   Pulse 78   Temp 98.8 F (37.1 C) (Oral)   Resp 20   Ht 5\' 2"  (1.575 m)   Wt 70.8 kg   SpO2 97%   BMI 28.55 kg/m   Awake, alert, oriented to self, location, year Speech slow but appropriate CN grossly intact  MAEW with symmetric strength EVD functioning, straw colored CSF  CBC    Component Value Date/Time   WBC 8.4 12/11/2017 1529   RBC 3.82 (L) 12/11/2017 1529   HGB 11.5 (L) 12/11/2017 1529   HCT 35.4 (L) 12/11/2017 1529   PLT 469 (H) 12/11/2017 1529   MCV 92.7 12/11/2017 1529   MCH 30.1 12/11/2017 1529   MCHC 32.5 12/11/2017 1529   RDW 14.6 12/11/2017 1529   LYMPHSABS 1.8 11/29/2017 0432   MONOABS 1.2 (H) 11/29/2017 0432   EOSABS 0.1 11/29/2017 0432   BASOSABS 0.0 11/29/2017 0432    BMP Latest Ref Rng & Units 12/11/2017 12/01/2017 11/30/2017  Glucose 70 - 99 mg/dL 98 132(H) 120(H)  BUN 8 - 23 mg/dL <5(L) <5(L) <5(L)  Creatinine 0.44 - 1.00 mg/dL 0.59 0.43(L) 0.44  Sodium 135 - 145 mmol/L 138 138 139  Potassium 3.5 - 5.1 mmol/L 3.8 3.2(L) 3.1(L)  Chloride 98 - 111 mmol/L 105 105 104  CO2 22 - 32 mmol/L 23 24 26   Calcium 8.9 - 10.3 mg/dL 8.8(L) 8.1(L) 8.1(L)    Urinalysis    Component Value Date/Time   COLORURINE STRAW (A) 12/11/2017 1650   APPEARANCEUR CLEAR 12/11/2017 1650   LABSPEC 1.008 12/11/2017 1650   PHURINE 6.0 12/11/2017 1650   GLUCOSEU NEGATIVE 12/11/2017 1650   HGBUR MODERATE (A) 12/11/2017 1650   BILIRUBINUR NEGATIVE 12/11/2017 1650   KETONESUR NEGATIVE 12/11/2017 1650   PROTEINUR NEGATIVE 12/11/2017 1650   UROBILINOGEN 0.2 08/29/2007 0136   NITRITE POSITIVE (A) 12/11/2017 1650   LEUKOCYTESUR MODERATE (A) 12/11/2017 1650    IMPRESSION/PLAN 62 y.o. female SAH d#17 s/p basilar aneurysm coiling, remains neurologically stable - more alert this am. Appears shunt  dependent.  Labs reviewed from yesterday indicating UTI.  - Urinary retention: foley placed for bladder rest. Started on flomax. - UTI: started on Bactrim DS BID po x5 days - Cont EVD at [place]. Likely needing VPS next week

## 2017-12-12 NOTE — Progress Notes (Signed)
Pt walking w/ PT, started leaning to left. Pt assisted to chair by Becca, PT. BP 68/59, pt staring off to left and decreased responsiveness. Paged [place], [place]. Verbal order for stat CT head and 500cc bolus. BP improved to 114/70.

## 2017-12-12 NOTE — Progress Notes (Signed)
  Speech Language Pathology Treatment: Cognitive-Linquistic  Patient Details Name: Sheryl James MRN: [id number] DOB: [birth year] Today's Date: 12/12/2017 Time: 4098-1191 SLP Time Calculation (min) (ACUTE ONLY): 20 min  Assessment / Plan / Recommendation Clinical Impression  Pt was seen at bedside for skilled ST intervention targeting goals for improved attention. Pt was much more alert and responsive today than when previously seen by this SLP. Pt answered questions with less delay in response time today. Pt was unable to state the current year, but recalled the year of birth of her daughter. Given mod+ assist, pt was able to determine her daughter's age given DOB and current year. Pt recalled her birthdate, and that of her husbands, but required cues to recall how long they have been married.   Significant improvement in pt status today! Continued ST intervention is recommended, with continued diagnostic treatment as pt continues to improve.    HPI HPI: 62 y.o. 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 Coughing with meds s/p procedure and SLP ordered on 10/3. Results of BSE on 10/4 were normal swallow function without overt s/s of aspiration or penetration and only concerns were secondary to lethargy. Per RN, patient coughing with medications and unable to swallow pills and so a new BSE was ordered on 10/5.       SLP Plan  Continue with current plan of care       Recommendations   ST 1-2x/week for 1-2 weeks. Recommend continued ST intervention following acute hospitalization.                Oral Care Recommendations: Oral care BID Follow up Recommendations: Inpatient Rehab SLP Visit Diagnosis: Cognitive communication deficit (Y78.295) Plan: Continue with current plan of care       GO              Sheryl James [facility], CCC-SLP Speech Language Pathologist [phone]  Shonna Chock 12/12/2017, 1:21 PM

## 2017-12-12 NOTE — Progress Notes (Signed)
Pt taken to CT by this RN and Jinny Blossom, Therapist, sports. Pt vomited 1x in CT. Called [place], [place] and awaiting call back.

## 2017-12-12 NOTE — Progress Notes (Addendum)
Physical Therapy Treatment Patient Details Name: Sheryl James MRN: [id number] DOB: [birth year] Today's Date: 12/12/2017    History of Present Illness 62 y.o. 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 Pt with syncopal event during therapy on 12/12/17 with possible seizure, stat CT ordered.     PT Comments    Pt had an eventful walk, starting out much as husband stated yesterday with a more alert, awake looking patient this AM compared to yesterday PM.  Vitals were taken before gait and were stable and PT started out as we usually do down the hallway to walk with the RW.  After ~25' pt became less responsive and a chair was pulled up from behind.  Pt was hypotensive with fixed gaze to the left as well as trembling R leg.  Pt would respond to noxious stimuli (sternal rub), but not really to voice.  Pt brought back to room, positioned in bed and take to state CT (MD made aware of events), BPs were slowly trending upward again.  Pt's husband present as we were transferring her back to bed from the recliner chair and was informed of what happened.  PT will follow up on Monday.  Goals updated based on previous performance with therapy (not based on today).  PT will continue to follow acutely for safe mobility progression.  Check orthostatics next session and definitely have chair to follow for safety, even if she looks good.  Follow Up Recommendations  CIR;Supervision/Assistance - 24 hour     Equipment Recommendations  Rolling walker with 5" wheels    Recommendations for Other Services   NA     Precautions / Restrictions Precautions Precautions: Fall;Other (comment) Precaution Comments: EVD must be clamped, monitor vitals closely    Mobility  Bed Mobility Overal bed mobility: Needs Assistance Bed Mobility: Supine to Sit   Sidelying to sit: Min assist;HOB elevated       General bed mobility comments: Min assist to support trunk and help  lightly progress legs around to the side of the bed.   Transfers Overall transfer level: Needs assistance Equipment used: Rolling walker (2 wheeled) Transfers: Sit to/from Stand Sit to Stand: Mod assist;From elevated surface         General transfer comment: Lighter mod assist from high bed to RW.    Ambulation/Gait Ambulation/Gait assistance: Mod assist Gait Distance (Feet): 25 Feet Assistive device: Rolling walker (2 wheeled) Gait Pattern/deviations: Step-to pattern;Decreased step length - right;Decreased dorsiflexion - right;Drifts right/left Gait velocity: decreased Gait velocity interpretation: <1.31 ft/sec, indicative of household ambulator General Gait Details: Pt started out walk with her normal gait pattern, R LE drag and right lateral lean.  Therapist positioned on her right to help with trunk and RW control.  As we progressed into the hallway, the pt started slowing her speed, and dragging both feet to the point we stopped and just stood still, asking the RN to bring a chair.  While the RN was getting the chair, pt started to become less responsive, shifting her weight to the left and starting to slide down to the ground.  Therapist was able to support her long enough to get her sitting in chair brought up from behind.  Once seated, serial BPs take (one was also taken before gait and was normal.  BPs immediately after sitting were very low 60s/50s and did slowly reboud to 90s/70s after about 10 mins.  Pt also continued to have decreased  responsiveness, left ward fixed gaze and right leg shaking suspicous for seizure.  RN paged MD and team assisted pt back to recliner, back to room and back to the bed total assist as pt was unable to continue to physically assist.  Husband came in as we were transferring her to the bed and I relayed what happened.  Her ventric was clamed for mobility.        Modified Rankin (Stroke Patients Only) Modified Rankin (Stroke Patients Only) Pre-Morbid  Rankin Score: No symptoms Modified Rankin: Moderately severe disability     Balance Overall balance assessment: Needs assistance Sitting-balance support: Feet supported;Bilateral upper extremity supported Sitting balance-Leahy Scale: Fair     Standing balance support: Bilateral upper extremity supported Standing balance-Leahy Scale: Poor                              Cognition Arousal/Alertness: Awake/alert Behavior During Therapy: WFL for tasks assessed/performed(much less flat, making jokes, laughing) Overall Cognitive Status: Impaired/Different from baseline Area of Impairment: Orientation;Attention;Memory;Following commands;Safety/judgement;Awareness;Problem solving                 Orientation Level: Time;Situation Current Attention Level: Sustained Memory: Decreased short-term memory Following Commands: Follows one step commands with increased time Safety/Judgement: Decreased awareness of safety;Decreased awareness of deficits Awareness: Intellectual Problem Solving: Slow processing;Decreased initiation;Difficulty sequencing;Requires verbal cues;Requires tactile cues General Comments: Pt much  more alert (as pt's husband stated, she is more alert and communicative in the AM), making jokes, laughing.  She still needs some help with initiation of movement             Pertinent Vitals/Pain Pain Assessment: Faces Faces Pain Scale: Hurts little more Pain Location: generalized, pt unable to localize Pain Descriptors / Indicators: Grimacing Pain Intervention(s): Limited activity within patient's tolerance;Monitored during session;Repositioned           PT Goals (current goals can now be found in the care plan section) Acute Rehab PT Goals Patient Stated Goal: none stated PT Goal Formulation: Patient unable to participate in goal setting Time For Goal Achievement: 12/26/17 Potential to Achieve Goals: Good Progress towards PT goals: Not progressing  toward goals - comment(due to syncopal event today)    Frequency    Min 3X/week      PT Plan Current plan remains appropriate       AM-PAC PT "6 Clicks" Daily Activity  Outcome Measure  Difficulty turning over in bed (including adjusting bedclothes, sheets and blankets)?: Unable Difficulty moving from lying on back to sitting on the side of the bed? : Unable Difficulty sitting down on and standing up from a chair with arms (e.g., wheelchair, bedside commode, etc,.)?: Unable Help needed moving to and from a bed to chair (including a wheelchair)?: A Lot Help needed walking in hospital room?: A Lot Help needed climbing 3-5 steps with a railing? : Total 6 Click Score: 8    End of Session Equipment Utilized During Treatment: Gait belt Activity Tolerance: Other (comment)(limited by low BP and possible seizure) Patient left: in bed;with call bell/phone within reach;with family/visitor present;with nursing/sitter in room   PT Visit Diagnosis: Other symptoms and signs involving the nervous system (R29.898);Other abnormalities of gait and mobility (R26.89);Muscle weakness (generalized) (M62.81)     Time: 8563-1497 PT Time Calculation (min) (ACUTE ONLY): 39 min  Charges:  $Gait Training: 8-22 mins $Therapeutic Activity: 23-37 mins          Fritzi Scripter B. Joanthony Hamza,  PT, DPT  Acute Rehabilitation #[phone] pager #[phone] office            12/12/2017, 5:19 PM

## 2017-12-12 NOTE — Progress Notes (Addendum)
Vascular lab progress note:  Patient has had 6 TCDs which is all per sah protocol. Note left regarding this on 10/16 as well. Attempted to call [place], [place] with no answer due to phone issues. Spoke to [place], Therapist, sports and she did not have a different phone number. Let her konw that test will be on hold until we hear back.  Landry Mellow, [place], [place]

## 2017-12-13 NOTE — Progress Notes (Signed)
Neurosurgery Service Progress Note  Subjective: No acute events overnight.    Objective: Vitals:   12/13/17 0500 12/13/17 0600 12/13/17 0700 12/13/17 0800  BP: 127/60 128/68  (!) 144/79  Pulse: 82 68 78 70  Resp: 17 (!) 24 13 18   Temp:    97.8 F (36.6 C)  TempSrc:    Axillary  SpO2: 99% 100% 97% 97%  Weight:      Height:       Temp (24hrs), Avg:98.7 F (37.1 C), Min:97.8 F (36.6 C), Max:100.9 F (38.3 C)  CBC Latest Ref Rng & Units 12/11/2017 11/29/2017 11/26/2017  WBC 4.0 - 10.5 K/uL 8.4 11.6(H) 18.3(H)  Hemoglobin 12.0 - 15.0 g/dL 11.5(L) 11.3(L) 15.2(H)  Hematocrit 36.0 - 46.0 % 35.4(L) 34.1(L) 46.4(H)  Platelets 150 - 400 K/uL 469(H) 243 408(H)   BMP Latest Ref Rng & Units 12/11/2017 12/01/2017 11/30/2017  Glucose 70 - 99 mg/dL 98 132(H) 120(H)  BUN 8 - 23 mg/dL <5(L) <5(L) <5(L)  Creatinine 0.44 - 1.00 mg/dL 0.59 0.43(L) 0.44  Sodium 135 - 145 mmol/L 138 138 139  Potassium 3.5 - 5.1 mmol/L 3.8 3.2(L) 3.1(L)  Chloride 98 - 111 mmol/L 105 105 104  CO2 22 - 32 mmol/L 23 24 26   Calcium 8.9 - 10.3 mg/dL 8.8(L) 8.1(L) 8.1(L)    Intake/Output Summary (Last 24 hours) at 12/13/2017 0930 Last data filed at 12/13/2017 0800 Gross per 24 hour  Intake 2406.45 ml  Output 3748 ml  Net -1341.55 ml    Current Facility-Administered Medications:  .   stroke: mapping our early stages of recovery book, , Does not apply, Once, Ashok Pall, MD .  0.9 %  sodium chloride infusion, , Intravenous, PRN, Ashok Pall, MD, Stopped at 11/29/17 1352 .  0.9 % NaCl with KCl 20 mEq/ L  infusion, , Intravenous, Continuous, Bergman, Meghan D, NP, Last Rate: 100 mL/hr at 12/13/17 0753 .  acetaminophen (TYLENOL) tablet 650 mg, 650 mg, Oral, Q4H PRN, 650 mg at 12/02/17 1714 **OR** acetaminophen (TYLENOL) solution 650 mg, 650 mg, Per Tube, Q4H PRN, 650 mg at 12/12/17 1137 **OR** acetaminophen (TYLENOL) suppository 650 mg, 650 mg, Rectal, Q4H PRN, Ashok Pall, MD, 650 mg at 11/29/17 0411 .   ceFAZolin (ANCEF) IVPB 1 g/50 mL premix, 1 g, Intravenous, Q8H, Ashok Pall, MD, Stopped at 12/13/17 [phone] .  clevidipine (CLEVIPREX) infusion 0.5 mg/mL, 0-21 mg/hr, Intravenous, Continuous, Ashok Pall, MD, Stopped at 11/30/17 2328 .  docusate sodium (COLACE) capsule 100 mg, 100 mg, Oral, BID, Ashok Pall, MD, Stopped at 12/03/17 1010 .  Gerhardt's butt cream, , Topical, BID, Consuella Lose, MD .  HYDROmorphone (DILAUDID) injection 0.5 mg, 0.5 mg, Intravenous, Q2H PRN, Ashok Pall, MD, 0.5 mg at 11/27/17 0250 .  iohexol (OMNIPAQUE) 300 MG/ML solution 100 mL, 100 mL, Intra-arterial, Once PRN, Consuella Lose, MD .  levETIRAcetam (KEPPRA) tablet 500 mg, 500 mg, Oral, BID, Ashok Pall, MD, 500 mg at 12/13/17 0902 .  loperamide HCl (IMODIUM) 1 MG/7.5ML suspension 2 mg, 2 mg, Oral, PRN, Viona Gilmore D, NP, 2 mg at 12/09/17 1604 .  MEDLINE mouth rinse, 15 mL, Mouth Rinse, BID, Ashok Pall, MD, 15 mL at 12/12/17 2143 .  niMODipine (NIMOTOP) capsule 60 mg, 60 mg, Oral, Q4H, 60 mg at 12/11/17 2328 **OR** niMODipine (NYMALIZE) 60 MG/20ML oral solution 60 mg, 60 mg, Oral, Q4H, Consuella Lose, MD, 60 mg at 12/13/17 0810 .  ondansetron (ZOFRAN-ODT) disintegrating tablet 4 mg, 4 mg, Oral, Q6H PRN **OR** ondansetron (ZOFRAN) injection 4  mg, 4 mg, Intravenous, Q6H PRN, Ashok Pall, MD, 4 mg at 11/27/17 1201 .  pantoprazole (PROTONIX) EC tablet 40 mg, 40 mg, Oral, Daily, 40 mg at 12/13/17 0902 **OR** pantoprazole sodium (PROTONIX) 40 mg/20 mL oral suspension 40 mg, 40 mg, Per Tube, Daily, Ashok Pall, MD, 40 mg at 12/09/17 0926 .  saccharomyces boulardii (FLORASTOR) capsule 250 mg, 250 mg, Oral, BID, Bergman, Meghan D, NP, 250 mg at 12/13/17 0902 .  sulfamethoxazole-trimethoprim (BACTRIM DS,SEPTRA DS) 800-160 MG per tablet 1 tablet, 1 tablet, Oral, Q12H, Costella, Vista Mink, PA-C, 1 tablet at 12/13/17 0902 .  tamsulosin (FLOMAX) capsule 0.4 mg, 0.4 mg, Oral, Daily, Costella, Vincent J,  PA-C, 0.4 mg at 12/13/17 0902 .  traMADol (ULTRAM) tablet 50-100 mg, 50-100 mg, Oral, Q6H PRN, Ashok Pall, MD   Physical Exam: AOx3, PERRL, EOMI, FS, TM, Strength 5/5 x4, SILTx4 Drain site c/d/i  Assessment & Plan: 62 y.o. woman s/p basilar aneurysm coiling, now day 17. -persistent ventriculomegaly on CT, will drop EVD to +5 above tragus -orthostasis episode yesterday s/p bolus, BP stable since then  [place]  12/13/17 9:30 AM

## 2017-12-14 LAB — RENAL FUNCTION PANEL
ANION GAP: 7 (ref 5–15)
Albumin: 2.7 g/dL — ABNORMAL LOW (ref 3.5–5.0)
CO2: 23 mmol/L (ref 22–32)
Calcium: 9.1 mg/dL (ref 8.9–10.3)
Chloride: 108 mmol/L (ref 98–111)
Creatinine, Ser: 0.66 mg/dL (ref 0.44–1.00)
GFR calc Af Amer: >60 mL/min (ref >60)
GFR calc non Af Amer: >60 mL/min (ref >60)
GLUCOSE: 102 mg/dL — AB (ref 70–99)
POTASSIUM: 4.2 mmol/L (ref 3.5–5.1)
Phosphorus: 4.1 mg/dL (ref 2.5–4.6)
SODIUM: 138 mmol/L (ref 135–145)

## 2017-12-14 LAB — OSMOLALITY, URINE: Osmolality, Ur: 476 mOsm/kg (ref 300–900)

## 2017-12-14 LAB — URINE CULTURE: Culture: >=100000 — AB

## 2017-12-14 LAB — OSMOLALITY: Osmolality: 292 mOsm/kg (ref 275–295)

## 2017-12-14 LAB — SODIUM, URINE, RANDOM: Sodium, Ur: 165 mmol/L

## 2017-12-14 NOTE — Progress Notes (Signed)
Neurosurgery Service Progress Note  Subjective: No acute events overnight, more active per husband but also trying to ambulate now on her own against instructions    Objective: Vitals:   12/14/17 0600 12/14/17 0800 12/14/17 0900 12/14/17 1100  BP: 129/69 (!) 141/66 140/68 139/63  Pulse: 78 77  84  Resp: 19 19 19 19   Temp:  98 F (36.7 C)    TempSrc:  Oral    SpO2: 97% 99%  96%  Weight:      Height:       Temp (24hrs), Avg:97.8 F (36.6 C), Min:97.5 F (36.4 C), Max:98.2 F (36.8 C)  CBC Latest Ref Rng & Units 12/11/2017 11/29/2017 11/26/2017  WBC 4.0 - 10.5 K/uL 8.4 11.6(H) 18.3(H)  Hemoglobin 12.0 - 15.0 g/dL 11.5(L) 11.3(L) 15.2(H)  Hematocrit 36.0 - 46.0 % 35.4(L) 34.1(L) 46.4(H)  Platelets 150 - 400 K/uL 469(H) 243 408(H)   BMP Latest Ref Rng & Units 12/11/2017 12/01/2017 11/30/2017  Glucose 70 - 99 mg/dL 98 132(H) 120(H)  BUN 8 - 23 mg/dL <5(L) <5(L) <5(L)  Creatinine 0.44 - 1.00 mg/dL 0.59 0.43(L) 0.44  Sodium 135 - 145 mmol/L 138 138 139  Potassium 3.5 - 5.1 mmol/L 3.8 3.2(L) 3.1(L)  Chloride 98 - 111 mmol/L 105 105 104  CO2 22 - 32 mmol/L 23 24 26   Calcium 8.9 - 10.3 mg/dL 8.8(L) 8.1(L) 8.1(L)    Intake/Output Summary (Last 24 hours) at 12/14/2017 1146 Last data filed at 12/14/2017 1100 Gross per 24 hour  Intake 2356.67 ml  Output 4423 ml  Net -2066.33 ml    Current Facility-Administered Medications:  .   stroke: mapping our early stages of recovery book, , Does not apply, Once, Ashok Pall, MD .  0.9 %  sodium chloride infusion, , Intravenous, PRN, Ashok Pall, MD, Stopped at 11/29/17 1352 .  0.9 % NaCl with KCl 20 mEq/ L  infusion, , Intravenous, Continuous, Bergman, Meghan D, NP, Last Rate: 100 mL/hr at 12/14/17 0700 .  acetaminophen (TYLENOL) tablet 650 mg, 650 mg, Oral, Q4H PRN, 650 mg at 12/02/17 1714 **OR** acetaminophen (TYLENOL) solution 650 mg, 650 mg, Per Tube, Q4H PRN, 650 mg at 12/12/17 1137 **OR** acetaminophen (TYLENOL) suppository 650 mg,  650 mg, Rectal, Q4H PRN, Ashok Pall, MD, 650 mg at 11/29/17 0411 .  ceFAZolin (ANCEF) IVPB 1 g/50 mL premix, 1 g, Intravenous, Q8H, Ashok Pall, MD, Stopped at 12/14/17 0542 .  clevidipine (CLEVIPREX) infusion 0.5 mg/mL, 0-21 mg/hr, Intravenous, Continuous, Ashok Pall, MD, Stopped at 11/30/17 2328 .  docusate sodium (COLACE) capsule 100 mg, 100 mg, Oral, BID, Ashok Pall, MD, Stopped at 12/03/17 1010 .  Gerhardt's butt cream, , Topical, BID, Consuella Lose, MD .  HYDROmorphone (DILAUDID) injection 0.5 mg, 0.5 mg, Intravenous, Q2H PRN, Ashok Pall, MD, 0.5 mg at 11/27/17 0250 .  iohexol (OMNIPAQUE) 300 MG/ML solution 100 mL, 100 mL, Intra-arterial, Once PRN, Consuella Lose, MD .  levETIRAcetam (KEPPRA) tablet 500 mg, 500 mg, Oral, BID, Ashok Pall, MD, 500 mg at 12/14/17 0904 .  loperamide HCl (IMODIUM) 1 MG/7.5ML suspension 2 mg, 2 mg, Oral, PRN, Viona Gilmore D, NP, 2 mg at 12/09/17 1604 .  MEDLINE mouth rinse, 15 mL, Mouth Rinse, BID, Ashok Pall, MD, 15 mL at 12/13/17 2109 .  niMODipine (NIMOTOP) capsule 60 mg, 60 mg, Oral, Q4H, 60 mg at 12/11/17 2328 **OR** niMODipine (NYMALIZE) 60 MG/20ML oral solution 60 mg, 60 mg, Oral, Q4H, Consuella Lose, MD, 60 mg at 12/14/17 1117 .  ondansetron (ZOFRAN-ODT)  disintegrating tablet 4 mg, 4 mg, Oral, Q6H PRN **OR** ondansetron (ZOFRAN) injection 4 mg, 4 mg, Intravenous, Q6H PRN, Ashok Pall, MD, 4 mg at 12/14/17 0315 .  pantoprazole (PROTONIX) EC tablet 40 mg, 40 mg, Oral, Daily, 40 mg at 12/14/17 0904 **OR** pantoprazole sodium (PROTONIX) 40 mg/20 mL oral suspension 40 mg, 40 mg, Per Tube, Daily, Ashok Pall, MD, 40 mg at 12/09/17 0926 .  saccharomyces boulardii (FLORASTOR) capsule 250 mg, 250 mg, Oral, BID, Bergman, Meghan D, NP, 250 mg at 12/14/17 0904 .  sulfamethoxazole-trimethoprim (BACTRIM DS,SEPTRA DS) 800-160 MG per tablet 1 tablet, 1 tablet, Oral, Q12H, Costella, Vista Mink, PA-C, 1 tablet at 12/14/17 0904 .   tamsulosin (FLOMAX) capsule 0.4 mg, 0.4 mg, Oral, Daily, Costella, Vincent J, PA-C, 0.4 mg at 12/14/17 0904 .  traMADol (ULTRAM) tablet 50-100 mg, 50-100 mg, Oral, Q6H PRN, Ashok Pall, MD   Physical Exam: AOx3, PERRL, EOMI, FS, TM, Strength 5/5 x4, SILTx4 Drain site c/d/i  Assessment & Plan: 62 y.o. woman s/p basilar aneurysm coiling, now day 17. 10/19 EVD decreased from +10 to +5. -repeat CTH tomorrow to re-assess ventricular size -significant UOP, no tachycardia, will order labs to check for CSW  Judith Part  12/14/17 11:46 AM

## 2017-12-15 ENCOUNTER — Inpatient Hospital Stay (HOSPITAL_COMMUNITY): Payer: 59

## 2017-12-15 MED ORDER — CIPROFLOXACIN HCL 500 MG PO TABS
500.0000 mg | ORAL_TABLET | Freq: Two times a day (BID) | ORAL | Status: DC
  Administered 2017-12-15 – 2017-12-25 (×22): 500 mg via ORAL
  Filled 2017-12-15 (×23): qty 1

## 2017-12-15 NOTE — Progress Notes (Signed)
  NEUROSURGERY PROGRESS NOTE   No issues overnight. Much more awake this morning. Walked around the unit already this am.   EXAM:  BP (!) 163/62   Pulse 70   Temp 98.2 F (36.8 C) (Oral)   Resp 19   Ht 5\' 2"  (1.575 m)   Wt 70.8 kg   SpO2 98%   BMI 28.55 kg/m   Awake, alert Speech fluent CN grossly intact  5/5 BUE/BLE   IMPRESSION:  62 y.o. female s/p SAH 20, neurologically stable. Shunt dependent likely at low pressure. Seems to be doing well with EVD open at 5 cm. Psuedomonas UTI   PLAN: - Will check susceptibilities to Trim/Sulfa, otherwise may change abx - Cont EVD at 5 - Will likely need VPS once treated for UTI.

## 2017-12-15 NOTE — Progress Notes (Signed)
Physical Therapy Treatment Patient Details Name: Sheryl James MRN: [id number] DOB: 1955-12-21 Today's Date: 12/15/2017    History of Present Illness 62 y.o. 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 Pt with syncopal event during therapy on 12/12/17 with possible seizure, stat CT ordered.     PT Comments    Pt much improved today. Pt able to respond appropriately to all questions asked this session. Pt ambulated entire unit with RW and minA, 2nd person for line management and chair follow. Pt with one standing rest break otherwise tolerated well. Pt remains to have both cognitive and functional deficits requiring assist for all mobility and ADLs. Pt also continues to close L eye but denies blurred or double vision or dizziness. Cont to recommend CIR upon d/c.    Follow Up Recommendations  CIR;Supervision/Assistance - 24 hour     Equipment Recommendations  Rolling walker with 5" wheels    Recommendations for Other Services Rehab consult     Precautions / Restrictions Precautions Precautions: Fall;Other (comment) Precaution Comments: EVD must be clamped, monitor vitals closely Restrictions Weight Bearing Restrictions: No    Mobility  Bed Mobility Overal bed mobility: Needs Assistance Bed Mobility: Supine to Sit;Rolling Rolling: Min guard Sidelying to sit: Min assist       General bed mobility comments: pt initiated pushing up with L UE, minA to complete upright position  Transfers Overall transfer level: Needs assistance Equipment used: Rolling walker (2 wheeled) Transfers: Sit to/from Stand Sit to Stand: Min assist         General transfer comment: verbal cues to push up from bed, minA to maintain balance during transition of hands from bed to RW  Ambulation/Gait Ambulation/Gait assistance: Min assist Gait Distance (Feet): 200 Feet Assistive device: Rolling walker (2 wheeled) Gait Pattern/deviations: Step-through  pattern Gait velocity: dec Gait velocity interpretation: <1.8 ft/sec, indicate of risk for recurrent falls General Gait Details: pt able to consistently clear R foot this date and in a smooth sequential stepping pattern, with onset of fatigue pt with decreased step height and length, minA for walker managemetn. verbal cues to hold head up and look ahead   Stairs             Wheelchair Mobility    Modified Rankin (Stroke Patients Only) Modified Rankin (Stroke Patients Only) Pre-Morbid Rankin Score: No symptoms Modified Rankin: Moderately severe disability     Balance Overall balance assessment: Needs assistance Sitting-balance support: Feet supported;Bilateral upper extremity supported Sitting balance-Leahy Scale: Fair Sitting balance - Comments: pt able to maintain sitting EOB without physical assist, close min guard as pt with some lateral sway   Standing balance support: Bilateral upper extremity supported Standing balance-Leahy Scale: Poor Standing balance comment: needs external support from RW and AD.                             Cognition Arousal/Alertness: Awake/alert Behavior During Therapy: WFL for tasks assessed/performed Overall Cognitive Status: Impaired/Different from baseline Area of Impairment: Orientation;Following commands;Safety/judgement;Problem solving                 Orientation Level: (improved, stated 2012, PT said 84yrs ago, pt said 2019)   Memory: Decreased short-term memory Following Commands: Follows one step commands consistently(improved from last week) Safety/Judgement: Decreased awareness of safety;Decreased awareness of deficits Awareness: Intellectual(remains unaware of stool) Problem Solving: Slow processing;Requires verbal cues;Requires tactile cues(pt initiated today)  General Comments: pt much more interactive this date and appropriate with conversation. Pt initiated transfer to EOB and stand transfer       Exercises      General Comments General comments (skin integrity, edema, etc.): VSS, pt incontinent of stool, dependent for hygiene in the bed      Pertinent Vitals/Pain Pain Assessment: Faces Faces Pain Scale: Hurts little more Pain Location: generalized, pt unable to localize Pain Descriptors / Indicators: Grimacing Pain Intervention(s): Monitored during session    Home Living                      Prior Function            PT Goals (current goals can now be found in the care plan section) Progress towards PT goals: Progressing toward goals    Frequency    Min 3X/week      PT Plan Current plan remains appropriate    Co-evaluation              AM-PAC PT "6 Clicks" Daily Activity  Outcome Measure  Difficulty turning over in bed (including adjusting bedclothes, sheets and blankets)?: Unable Difficulty moving from lying on back to sitting on the side of the bed? : Unable Difficulty sitting down on and standing up from a chair with arms (e.g., wheelchair, bedside commode, etc,.)?: Unable Help needed moving to and from a bed to chair (including a wheelchair)?: A Little Help needed walking in hospital room?: A Little Help needed climbing 3-5 steps with a railing? : Total 6 Click Score: 10    End of Session Equipment Utilized During Treatment: Gait belt;Other (comment)(pt maintained >97% on RA this date)   Patient left: in chair;with call bell/phone within reach;with chair alarm set;with family/visitor present Nurse Communication: Mobility status PT Visit Diagnosis: Difficulty in walking, not elsewhere classified (R26.2)     Time: 1583-0940 PT Time Calculation (min) (ACUTE ONLY): 31 min  Charges:  $Gait Training: 23-37 mins                     Kittie Plater, PT, DPT Acute Rehabilitation Services Pager #: [phone] Office #: [phone]    Berline Lopes 12/15/2017, 11:36 AM

## 2017-12-16 MED ORDER — BETHANECHOL CHLORIDE 10 MG PO TABS
10.0000 mg | ORAL_TABLET | Freq: Three times a day (TID) | ORAL | Status: DC
  Administered 2017-12-16 – 2017-12-17 (×6): 10 mg via ORAL
  Administered 2017-12-18 – 2017-12-19 (×4): 20 mg via ORAL
  Administered 2017-12-19: 50 mg via ORAL
  Administered 2017-12-19 – 2017-12-21 (×7): 30 mg via ORAL
  Administered 2017-12-22 (×2): 10 mg via ORAL
  Administered 2017-12-22 – 2017-12-25 (×10): 30 mg via ORAL
  Filled 2017-12-16: qty 2
  Filled 2017-12-16: qty 3
  Filled 2017-12-16: qty 2
  Filled 2017-12-16: qty 3
  Filled 2017-12-16: qty 5
  Filled 2017-12-16: qty 3
  Filled 2017-12-16: qty 2
  Filled 2017-12-16: qty 5
  Filled 2017-12-16 (×2): qty 3
  Filled 2017-12-16: qty 1
  Filled 2017-12-16 (×2): qty 3
  Filled 2017-12-16: qty 1
  Filled 2017-12-16: qty 3
  Filled 2017-12-16: qty 1
  Filled 2017-12-16 (×2): qty 3
  Filled 2017-12-16: qty 1
  Filled 2017-12-16: qty 2
  Filled 2017-12-16: qty 3
  Filled 2017-12-16 (×2): qty 1
  Filled 2017-12-16 (×3): qty 3
  Filled 2017-12-16: qty 1
  Filled 2017-12-16: qty 3
  Filled 2017-12-16: qty 1
  Filled 2017-12-16 (×2): qty 3

## 2017-12-16 NOTE — Progress Notes (Addendum)
   12/16/17 1000  Clinical Encounter Type  Visited With Patient and family together  Visit Type Initial  Making rounds and stepped in to visit patient. Patient was up in chair and her husband was sitting beside her. Just introduced myself and informed them that Spiritual Care services are available 24/7. Provided the ministry of presence Chaplain Matthew Folks

## 2017-12-16 NOTE — Progress Notes (Addendum)
Occupational Therapy Treatment Patient Details Name: Sheryl James MRN: [id number] DOB: 01/22/1956 Today's Date: 12/16/2017    History of present illness 62 y.o. 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 Pt with syncopal event during therapy on 12/12/17 with possible seizure, stat CT ordered.    OT comments  Patient continuing to progress slowly.  Requires increased time for slow processing, decreased problem solving and initiation, but improving functionally with min assist for toilet transfers +2 safety/equipment using RW, min assist toileting, and min assist for grooming.  Cueing while hand washing for sequencing and L inattention.  Poor recall of safe transfer techniques and safety awareness. Will continue to follow.    Follow Up Recommendations  CIR;Supervision/Assistance - 24 hour    Equipment Recommendations  3 in 1 bedside commode    Recommendations for Other Services Rehab consult    Precautions / Restrictions Precautions Precautions: Fall Precaution Comments: EVD must be clamped, monitor vitals closely Restrictions Weight Bearing Restrictions: No       Mobility Bed Mobility Overal bed mobility: Needs Assistance Bed Mobility: Sit to Supine       Sit to supine: Supervision;+2 for safety/equipment   General bed mobility comments: increased time, supervision for safety   Transfers Overall transfer level: Needs assistance Equipment used: Rolling walker (2 wheeled) Transfers: Sit to/from Stand Sit to Stand: Min assist         General transfer comment: for safety and balance, poor management for walker and recall of techniques    Balance Overall balance assessment: Needs assistance Sitting-balance support: Feet supported;No upper extremity supported Sitting balance-Leahy Scale: Fair     Standing balance support: No upper extremity supported;During functional activity Standing balance-Leahy Scale:  Poor Standing balance comment: needs external support for grooming at sink                           ADL either performed or assessed with clinical judgement   ADL Overall ADL's : Needs assistance/impaired     Grooming: Sitting;Cueing for sequencing;Cueing for safety;Wash/dry hands;Minimal assistance Grooming Details (indicate cue type and reason): max cueing to sequence hand washing task, decreased awareness to L side requiring max cueing to scan             Lower Body Dressing: Maximal assistance;Sitting/lateral leans Lower Body Dressing Details (indicate cue type and reason): demonstrates ability to adjust socks in figure 4 technique with min guard for safety Toilet Transfer: Minimal assistance;+2 for safety/equipment;Ambulation;RW;Regular Toilet;Grab bars Toilet Transfer Details (indicate cue type and reason): cueing for hand placement and safety Toileting- Clothing Manipulation and Hygiene: +2 for safety/equipment;Minimal assistance;Sit to/from stand       Functional mobility during ADLs: Minimal assistance;+2 for safety/equipment;Rolling walker General ADL Comments: slow processing and assist with walker mgmt/safety     Vision   Additional Comments: cueing to attend to and visually scan towards L side during session   Perception     Praxis      Cognition Arousal/Alertness: Awake/alert Behavior During Therapy: WFL for tasks assessed/performed Overall Cognitive Status: Impaired/Different from baseline Area of Impairment: Following commands;Safety/judgement;Problem solving;Awareness;Memory;Attention                   Current Attention Level: Sustained Memory: Decreased short-term memory Following Commands: Follows multi-step commands with increased time Safety/Judgement: Decreased awareness of safety;Decreased awareness of deficits Awareness: Intellectual Problem Solving: Slow processing;Requires verbal cues;Requires tactile cues General  Comments: pt with poor recall of hand placement, engages in conversation but slow to respond and flat affect        Exercises     Shoulder Instructions       General Comments VSS    Pertinent Vitals/ Pain       Pain Assessment: No/denies pain  Home Living                                          Prior Functioning/Environment              Frequency  Min 2X/week        Progress Toward Goals  OT Goals(current goals can now be found in the care plan section)  Progress towards OT goals: Progressing toward goals  Acute Rehab OT Goals Patient Stated Goal: none stated OT Goal Formulation: With patient/family Time For Goal Achievement: 12/12/17 Potential to Achieve Goals: Good  Plan Discharge plan remains appropriate    Co-evaluation                 AM-PAC PT "6 Clicks" Daily Activity     Outcome Measure   Help from another person eating meals?: A Lot Help from another person taking care of personal grooming?: A Little Help from another person toileting, which includes using toliet, bedpan, or urinal?: A Little Help from another person bathing (including washing, rinsing, drying)?: A Lot Help from another person to put on and taking off regular upper body clothing?: A Lot Help from another person to put on and taking off regular lower body clothing?: A Lot 6 Click Score: 14    End of Session Equipment Utilized During Treatment: Gait belt;Rolling walker  OT Visit Diagnosis: Unsteadiness on feet (R26.81);Muscle weakness (generalized) (M62.81);Other symptoms and signs involving cognitive function   Activity Tolerance Patient tolerated treatment well   Patient Left with call bell/phone within reach;with family/visitor present;in bed;with nursing/sitter in room   Nurse Communication Mobility status        Time: 9381-0175 OT Time Calculation (min): 27 min  Charges: OT General Charges $OT Visit: 1 Visit OT Treatments $Self  Care/Home Management : 23-37 mins  Delight Stare, [place] Pager [phone] Office [place] 12/16/2017, 5:45 PM

## 2017-12-16 NOTE — Progress Notes (Signed)
Physical Therapy Treatment Patient Details Name: Sheryl James MRN: [id number] DOB: Jul 20, 1955 Today's Date: 12/16/2017    History of Present Illness 62 y.o. 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 Pt with syncopal event during therapy on 12/12/17 with possible seizure, stat CT ordered.     PT Comments    Pt continuing to progress cognitively and functionally. Pt remains to have impaired processing and memory deficits. Transitioned to amb without AD this date but required 1 HHA to prevent fall. At this time patient more steady and stable with RW. Pt with increased initiation of conversation and ability to have a conversation. Acute PT to cont to follow.    Follow Up Recommendations  CIR;Supervision/Assistance - 24 hour     Equipment Recommendations  Rolling walker with 5" wheels    Recommendations for Other Services Rehab consult     Precautions / Restrictions Precautions Precautions: Fall Precaution Comments: EVD must be clamped, monitor vitals closely Restrictions Weight Bearing Restrictions: No    Mobility  Bed Mobility               General bed mobility comments: pt received sitting up in chair  Transfers Overall transfer level: Needs assistance Equipment used: Rolling walker (2 wheeled) Transfers: Sit to/from Stand Sit to Stand: Min assist         General transfer comment: pt with mild posterior lean this date requiring minA to achieve midline posture  Ambulation/Gait Ambulation/Gait assistance: Min assist;Mod assist Gait Distance (Feet): 200 Feet Assistive device: Rolling walker (2 wheeled);1 person hand held assist Gait Pattern/deviations: Step-through pattern;Decreased stride length;Decreased step length - right Gait velocity: dec Gait velocity interpretation: <1.8 ft/sec, indicate of risk for recurrent falls General Gait Details: trialed ambulation without AD, pt unsteady with narrowing stance,  decreased step length and requring min/modA from PT via R HHA to maintain balance. pt worsening with onset of fatigue. Near end PT provided tactile stimuli to pelvis for stability to allow for even sequencing  of stepping pattern   Stairs             Wheelchair Mobility    Modified Rankin (Stroke Patients Only) Modified Rankin (Stroke Patients Only) Pre-Morbid Rankin Score: No symptoms Modified Rankin: Moderately severe disability     Balance Overall balance assessment: Needs assistance Sitting-balance support: Feet supported;Bilateral upper extremity supported Sitting balance-Leahy Scale: Fair Sitting balance - Comments: pt able to maintain sitting EOB without physical assist, close min guard as pt with some lateral sway   Standing balance support: Single extremity supported Standing balance-Leahy Scale: Poor Standing balance comment: needs external support for ambulation                            Cognition Arousal/Alertness: Awake/alert Behavior During Therapy: WFL for tasks assessed/performed Overall Cognitive Status: Impaired/Different from baseline                         Following Commands: Follows multi-step commands with increased time Safety/Judgement: Decreased awareness of safety;Decreased awareness of deficits(remains incontinent, retaining urine) Awareness: Intellectual Problem Solving: Slow processing;Requires verbal cues;Requires tactile cues General Comments: pt able to initiate converstaion, pt cont to have poor memory, doesn't recall PT session or associate walking with PT      Exercises General Exercises - Lower Extremity Ankle Circles/Pumps: AROM;Both;Seated Long Arc Quad: AROM;Both;10 reps;Supine Hip ABduction/ADduction: AROM;Both;10 reps;Seated Hip Flexion/Marching:  AROM;10 reps;Seated    General Comments General comments (skin integrity, edema, etc.): VSS, no longer on O2, SPO2> 97% on RA      Pertinent Vitals/Pain  Pain Assessment: No/denies pain    Home Living                      Prior Function            PT Goals (current goals can now be found in the care plan section) Acute Rehab PT Goals Patient Stated Goal: none stated Progress towards PT goals: Progressing toward goals    Frequency    Min 3X/week      PT Plan Current plan remains appropriate    Co-evaluation              AM-PAC PT "6 Clicks" Daily Activity  Outcome Measure  Difficulty turning over in bed (including adjusting bedclothes, sheets and blankets)?: Unable Difficulty moving from lying on back to sitting on the side of the bed? : Unable Difficulty sitting down on and standing up from a chair with arms (e.g., wheelchair, bedside commode, etc,.)?: Unable Help needed moving to and from a bed to chair (including a wheelchair)?: A Little Help needed walking in hospital room?: A Little Help needed climbing 3-5 steps with a railing? : Total 6 Click Score: 10    End of Session Equipment Utilized During Treatment: Gait belt Activity Tolerance: Other (comment) Patient left: in chair;with call bell/phone within reach;with chair alarm set;with family/visitor present Nurse Communication: Mobility status PT Visit Diagnosis: Difficulty in walking, not elsewhere classified (R26.2)     Time: 7915-0569 PT Time Calculation (min) (ACUTE ONLY): 21 min  Charges:  $Gait Training: 8-22 mins $Therapeutic Exercise: 8-22 mins                     Sheryl James, PT, DPT Acute Rehabilitation Services Pager #: [phone] Office #: [phone]    Sheryl James 12/16/2017, 11:37 AM

## 2017-12-16 NOTE — Progress Notes (Signed)
  NEUROSURGERY PROGRESS NOTE   No issues overnight.  Does not have much of an appetite. Continued issues with urinary retention.  EXAM:  BP 136/70   Pulse 73   Temp 98.6 F (37 C) (Oral)   Resp 16   Ht 5\' 2"  (1.575 m)   Wt 70.8 kg   SpO2 97%   BMI 28.55 kg/m   Awake, alert, oriented  Speech fluent, appropriate  CN grossly intact  MAEW with symmetric strength EVD functioning at [place]. Straw colored CSF.  IMPRESSION/PLAN 62 y.o. female s/p SAH d#21 neurologically stable. Shunt dependent. - Continue EVD at [place].  - UTI: Abx switched from Bactrim to Cipro 500mg  BID x3 days yesterday am based on cx + for pseudomonas. Today is day 2/3 of antibiotics.  Will need to complete tx for UTI before VPS is placed. - Urinary retention: continue flomax. I/O prn. Try to avoid foley.

## 2017-12-17 MED ORDER — ENSURE ENLIVE PO LIQD
237.0000 mL | Freq: Two times a day (BID) | ORAL | Status: DC
  Administered 2017-12-18 – 2017-12-23 (×8): 237 mL via ORAL

## 2017-12-17 NOTE — Progress Notes (Signed)
  NEUROSURGERY PROGRESS NOTE   No issues overnight.  No concerns today  EXAM:  BP 127/60   Pulse 67   Temp 98.4 F (36.9 C) (Oral)   Resp 18   Ht 5\' 2"  (1.575 m)   Wt 70.8 kg   SpO2 93%   BMI 28.55 kg/m   Awake, alert, oriented  Speech fluent, appropriate  CN grossly intact  MAEW, nonfocal exam No drift EVD functioning at [place]. Straw colored CSF  IMPRESSION/PLAN 62 y.o. female s/p SAH d#22 neurologically stable. Shunt dependent. - Continue EVD at [place].  - UTI: Day 3/3 of Cipro - Dr Christella Noa to return tomorrow. Will determine next steps for VPS.

## 2017-12-17 NOTE — Progress Notes (Signed)
  Speech Language Pathology Treatment: Cognitive-Linquistic  Patient Details Name: Sheryl James MRN: [id number] DOB: November 23, 1955 Today's Date: 12/17/2017 Time: 6812-7517 SLP Time Calculation (min) (ACUTE ONLY): 20 min  Assessment / Plan / Recommendation Clinical Impression  Pt continues to make gradual progress in treatment. Pt was awake, alert, and cooperative this afternoon after 3PM - previously pt was too fatigued to participate in treatment in the afternoon! Pt able to answer questions with minimal delay. Diagnostic treatment today included administration of the Mini-Mental State Exam (MMSE). Pt scored 16/30, with points lost on orientation, attention/calculation, delayed recall, following complex/multistep verbal directions, and figure drawing. Pt was accurate for the current season, but was unable to provide the accurate year or month. She indicated "[place]" as current location. She was able to provide 4 category members in 60 seconds (an improvement from 0-1 provided last week, with significant delay in response time), provide categories for similar items (train/boat, drum/flute, north/south), and problem solve 2 ways to configure $13 (from the [facility] Cognitive Assessment - [facility]). Pt exhibited left neglect on clock drawing task, and was unable to set the hands correctly at 10 after 11.  Pt continues to make good progress in treatment, and would benefit from continued skilled ST intervention acutely, and at the next level of care. Husband not present during this session. Will continue.     HPI HPI: 62 y.o. female with recent HAs dx with 10.7 x6 x 6mm basilar apex aneurysm, s/p coiling 10/3, IVC. Coughing with meds s/p procedure and SLP ordered on 10/3. Results of BSE on 10/4 were normal swallow function without overt s/s of aspiration or penetration and only concerns were secondary to lethargy. Per RN, patient coughing with medications and unable to swallow pills and so a new BSE was  ordered on 10/5.       SLP Plan  Goals updated;Continue with current plan of care       Recommendations   continued ST intervention acutely and at next venue.                Oral Care Recommendations: Oral care BID Follow up Recommendations: Inpatient Rehab SLP Visit Diagnosis: Cognitive communication deficit (G01.749) Plan: Goals updated;Continue with current plan of care       GO              Celia B. Quentin Ore [facility], CCC-SLP Speech Language Pathologist [phone]  Shonna Chock 12/17/2017, 3:48 PM

## 2017-12-17 NOTE — Progress Notes (Signed)
Initial Nutrition Assessment  DOCUMENTATION CODES:   Not applicable  INTERVENTION:   Reviewed menu options with husband  Ensure Enlive po BID, each supplement provides 350 kcal and 20 grams of protein   NUTRITION DIAGNOSIS:   Inadequate oral intake related to decreased appetite, early satiety as evidenced by meal completion < 50%.  GOAL:   Patient will meet greater than or equal to 90% of their needs  MONITOR:   PO intake, Supplement acceptance  REASON FOR ASSESSMENT:   (Poor intake)    ASSESSMENT:   Pt admitted after a seizure with a SAH s/p coil embolization and EVD placed 10/3 which was replaced 10/12.    Pt discussed during ICU rounds and with RN.   Spoke to pt's husband outside pt's room during pt's bath.  Per husband when pt was less alert she was eating whatever was given to her. Now she is more alert she is limiting her intake. Pt has decreased appetite and early satiety. Pt is eating less than 50% of her meals. She did try an ensure yesterday. Per husband she drank all of it.   Medications reviewed and include: colace BID (held) NS with 20 mEq KCl/L @ 50 ml/hr Labs reviewed EVD: 105 ml x 24 hr UOP: 2525 ml  I/O: -4643 ml since admit   NUTRITION - FOCUSED PHYSICAL EXAM:  Unable to complete Nutrition-Focused physical exam at this time.   Diet Order:   Diet Order            Diet regular Room service appropriate? Yes; Fluid consistency: Thin  Diet effective now              EDUCATION NEEDS:   Education needs have been addressed  Skin:  Skin Assessment: (head incision)  Last BM:  10/23 medium  Height:   Ht Readings from Last 1 Encounters:  11/26/17 5\' 2"  (1.575 m)    Weight:   Wt Readings from Last 1 Encounters:  12/01/17 70.8 kg    Ideal Body Weight:  50 kg  BMI:  Body mass index is 28.55 kg/m.  Estimated Nutritional Needs:   Kcal:  1500-1700  Protein:  75-90 grams  Fluid:  > 1.5 L/day  Maylon Peppers RD, LDN,  CNSC [phone] Pager [phone] After Hours Pager

## 2017-12-17 NOTE — Progress Notes (Signed)
Physical Therapy Treatment Patient Details Name: Sheryl James MRN: [id number] DOB: Mar 15, 1955 Today's Date: 12/17/2017    History of Present Illness 62 y.o. 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 Pt with syncopal event during therapy on 12/12/17 with possible seizure, stat CT ordered.     PT Comments    Pt cont to become more talkative and conversational. Pt remains to have R sided weakness and coordination deficits requiring assist for safe ambulation. Pt with increased trunk extension this date as well. Acute PT to cont to follow.    Follow Up Recommendations  CIR;Supervision/Assistance - 24 hour     Equipment Recommendations  Rolling walker with 5" wheels    Recommendations for Other Services Rehab consult     Precautions / Restrictions Precautions Precautions: Fall Precaution Comments: EVD must be clamped, monitor vitals closely Restrictions Weight Bearing Restrictions: No    Mobility  Bed Mobility Overal bed mobility: Needs Assistance Bed Mobility: Sit to Supine       Sit to supine: Supervision;+2 for safety/equipment   General bed mobility comments: increased time, supervision for safety   Transfers Overall transfer level: Needs assistance Equipment used: None Transfers: Sit to/from Stand Sit to Stand: Min assist         General transfer comment: tactile cues to push up from chair, minA to steady due to retropulsion  Ambulation/Gait Ambulation/Gait assistance: Mod assist Gait Distance (Feet): 200 Feet Assistive device: None Gait Pattern/deviations: Step-through pattern;Decreased stride length;Decreased step length - right;Decreased dorsiflexion - right Gait velocity: dec Gait velocity interpretation: <1.8 ft/sec, indicate of risk for recurrent falls General Gait Details: pt with more back extension today requiring tactile cues at pelvis to posterior tilt and stabilize pelvis to promote increased R  step length and height   Stairs             Wheelchair Mobility    Modified Rankin (Stroke Patients Only) Modified Rankin (Stroke Patients Only) Pre-Morbid Rankin Score: No symptoms Modified Rankin: Moderately severe disability     Balance Overall balance assessment: Needs assistance Sitting-balance support: Feet supported;No upper extremity supported Sitting balance-Leahy Scale: Fair     Standing balance support: No upper extremity supported;During functional activity Standing balance-Leahy Scale: Poor Standing balance comment: needs external support for grooming at sink                            Cognition Arousal/Alertness: Awake/alert Behavior During Therapy: WFL for tasks assessed/performed Overall Cognitive Status: Impaired/Different from baseline Area of Impairment: Following commands;Safety/judgement;Problem solving;Awareness;Memory;Attention                   Current Attention Level: Sustained Memory: Decreased short-term memory Following Commands: Follows multi-step commands with increased time Safety/Judgement: Decreased awareness of safety;Decreased awareness of deficits Awareness: Intellectual Problem Solving: Slow processing;Requires verbal cues;Requires tactile cues        Exercises      General Comments General comments (skin integrity, edema, etc.): VSS      Pertinent Vitals/Pain Pain Assessment: No/denies pain    Home Living                      Prior Function            PT Goals (current goals can now be found in the care plan section) Progress towards PT goals: Progressing toward goals    Frequency  Min 3X/week      PT Plan Current plan remains appropriate    Co-evaluation              AM-PAC PT "6 Clicks" Daily Activity  Outcome Measure  Difficulty turning over in bed (including adjusting bedclothes, sheets and blankets)?: Unable Difficulty moving from lying on back to sitting on  the side of the bed? : Unable Difficulty sitting down on and standing up from a chair with arms (e.g., wheelchair, bedside commode, etc,.)?: Unable Help needed moving to and from a bed to chair (including a wheelchair)?: A Little Help needed walking in hospital room?: A Little Help needed climbing 3-5 steps with a railing? : Total 6 Click Score: 10    End of Session Equipment Utilized During Treatment: Gait belt Activity Tolerance: Other (comment) Patient left: in chair;with call bell/phone within reach;with chair alarm set;with family/visitor present Nurse Communication: Mobility status PT Visit Diagnosis: Difficulty in walking, not elsewhere classified (R26.2)     Time: 1610-9604 PT Time Calculation (min) (ACUTE ONLY): 13 min  Charges:  $Gait Training: 8-22 mins                     Kittie Plater, PT, DPT Acute Rehabilitation Services Pager #: [phone] Office #: [phone]    Berline Lopes 12/17/2017, 2:35 PM

## 2017-12-18 NOTE — Progress Notes (Signed)
Patient ID: Sheryl James, female   DOB: 07/09/1955, 62 y.o.   MRN: [id number] BP (!) 119/57   Pulse 77   Temp 98.8 F (37.1 C) (Oral)   Resp 19   Ht 5\' 2"  (1.575 m)   Wt 70.8 kg   SpO2 98%   BMI 28.55 kg/m  Alert and oriented to person, following some commands Moving all extremities well ventric site is clean, and dry Will possibly do shunt this weekend, or early next week.

## 2017-12-18 NOTE — Progress Notes (Signed)
Notified Dr Christella Noa that patient has not voided without using an in & out cath since 10/21, which was the same day that her foley was removed. Patient has continued to retain urine and has not voided on her own since catheter was removed on 10/21. Patient has been on the acute urinary retention protocol medications such as flomax since 10/18 and urecholine since 10/22 with no effects. Dr Christella Noa ordered to place a foley catheter. Will continue to monitor patient.

## 2017-12-18 NOTE — Consult Note (Signed)
[facility] Consult: 4:02 PM 12/18/2017  LOS: 22 days    Referring Provider: Dr. Cyndy Freeze Primary Care Physician: Does not have a PCP.  She goes to urgent care when she needs help. Primary Gastroenterologist:  unassigned     Reason for Consultation: Diarrhea.   HPI: Sheryl James is a 62 y.o. female.  Hx hypertension.  Status post C-section x2.  Sustained subarachnoid hemorrhage from ruptured aneurysm.  Underwent ventriculostomy drain on 10/3.  Latest head CT 10/21 shows slight decrease in the size of externally drained ventricles.  Plan is for shunt in the next few days.   Bedside swallow evaluation for coughing with p.o. meds and inability to swallow her pills..  Bedside swallows x 2 showed normal swallow function.  There were concerns that lethargy could be contributing to dysphagia.  Following overall has improved and she is tolerating solid diet.  Stools loose, non-bloody, between 4-6 per day, day and night.  Immodium has slowed stooling down but still has more than usual BM's.   Antibiotics are Ancef (day 21, indication is the ventricular catheter) and Cipro day 4 (for pseudomonal UTI) Other meds that list potential for diarrhea are Keppra, Bethanechol, Ensure,  Florastor in place for > 2 weeks.   C diff negative  Prior to her ruptured aneurysm patient had never undergone colonoscopy or upper endoscopy.  She tended to have constipation.  Never saw blood in her stool. Currently she does not have any abdominal pain, nausea.  Family history is negative for colon cancer, colitis, ulcers, diarrheal illnesses.    History reviewed. No pertinent past medical history.  Past Surgical History:  Procedure Laterality Date  . IR ANGIO INTRA EXTRACRAN SEL INTERNAL CAROTID BILAT MOD SED  11/27/2017  . IR ANGIO  VERTEBRAL SEL VERTEBRAL UNI L MOD SED  11/27/2017  . IR ANGIOGRAM FOLLOW UP STUDY  11/27/2017  . IR ANGIOGRAM FOLLOW UP STUDY  11/27/2017  . IR ANGIOGRAM FOLLOW UP STUDY  11/27/2017  . IR ANGIOGRAM FOLLOW UP STUDY  11/27/2017  . IR TRANSCATH/EMBOLIZ  11/27/2017  . IR US GUIDE VASC ACCESS RIGHT  11/27/2017  . RADIOLOGY WITH ANESTHESIA N/A 11/27/2017   Procedure: IR WITH ANESTHESIA;  Surgeon: Consuella Lose, MD;  Location: [facility];  Service: Radiology;  Laterality: N/A;    Prior to Admission medications   Medication Sig Start Date End Date Taking? Authorizing Provider  ibuprofen (ADVIL,MOTRIN) 200 MG tablet Take 600 mg by mouth every 6 (six) hours as needed for headache or moderate pain.   Yes [provider]    Scheduled Meds: .  stroke: mapping our early stages of recovery book   Does not apply Once  . bethanechol  10-50 mg Oral TID  . ciprofloxacin  500 mg Oral BID  . docusate sodium  100 mg Oral BID  . feeding supplement (ENSURE ENLIVE)  237 mL Oral BID BM  . Gerhardt's butt cream   Topical BID  . levETIRAcetam  500 mg Oral BID  . pantoprazole  40 mg Oral Daily   Or  .  pantoprazole sodium  40 mg Per Tube Daily  . saccharomyces boulardii  250 mg Oral BID  . tamsulosin  0.4 mg Oral Daily   Infusions: . sodium chloride Stopped (11/29/17 1352)  . 0.9 % NaCl with KCl 20 mEq / L 50 mL/hr at 12/18/17 1412  .  ceFAZolin (ANCEF) IV Stopped (12/18/17 1441)   PRN Meds: sodium chloride, acetaminophen **OR** acetaminophen (TYLENOL) oral liquid 160 mg/5 mL **OR** acetaminophen, loperamide HCl, ondansetron **OR** ondansetron (ZOFRAN) IV, traMADol   Allergies as of 11/26/2017  . (No Known Allergies)    History reviewed. No pertinent family history.  Social History   Socioeconomic History  . Marital status: Married    Spouse name: Not on file  . Number of children: Not on file  . Years of education: Not on file  . Highest education level: Not on file  Occupational History  .  Not on file  Social Needs  . Financial resource strain: Not on file  . Food insecurity:    Worry: Not on file    Inability: Not on file  . Transportation needs:    Medical: Not on file    Non-medical: Not on file  Tobacco Use  . Smoking status: Current Every Day Smoker    Packs/day: 0.50    Types: Cigarettes  . Smokeless tobacco: Never Used  Substance and Sexual Activity  . Alcohol use: Never    Frequency: Never  . Drug use: Never  . Sexual activity: Not on file  Lifestyle  . Physical activity:    Days per week: Not on file    Minutes per session: Not on file  . Stress: Not on file  Relationships  . Social connections:    Talks on phone: Not on file    Gets together: Not on file    Attends religious service: Not on file    Active member of club or organization: Not on file    Attends meetings of clubs or organizations: Not on file    Relationship status: Not on file  . Intimate partner violence:    Fear of current or ex partner: Not on file    Emotionally abused: Not on file    Physically abused: Not on file    Forced sexual activity: Not on file  Other Topics Concern  . Not on file  Social History Narrative  . Not on file    REVIEW OF SYSTEMS: Constitutional: Patient strength has been returning.  She is able to walk loops in the hallway with the assistance of a walker. ENT:  No nose bleeds Pulm: Shortness of breath or cough. CV:  No palpitations, no LE edema.  Chest pain GU:  No hematuria, no frequency GI:  Per HPI Heme: We will or excessive external bleeding or bruising. Transfusions: None Neuro: Patient has no memory of the events leading up to the ruptured aneurysm or the days following surgery. Derm:  No itching, no rash or sores.  Endocrine:  No sweats or chills.  No polyuria or dysuria Immunization: Did not query about recent immunizations.    PHYSICAL EXAM: Vital signs in last 24 hours: Vitals:   12/18/17 1200 12/18/17 1400  BP:  (!) 125/52    Pulse:  74  Resp:  19  Temp: (!) 97.3 F (36.3 C)   SpO2:  95%   Wt Readings from Last 3 Encounters:  12/01/17 70.8 kg    General: Patient somewhat pale and moderately ill appearing. Head: There is a  shaved area on her head with surgical scar. Eyes: No scleral icterus.  No conjunctival pallor. Ears: Not hard of hearing Nose: Discharge Mouth: Moist, clear, pink oral mucosa. Neck: No JVD, no masses, no thyromegaly Lungs: No labored breathing or cough.  Lungs clear. Heart: RRR.  No MRG.  S1, S2 present. Abdomen: Soft.  Not tender or distended.  No HSM, masses, bruits.  No hernias..   Rectal: Did not perform. Musc/Skeltl: No joint redness, swelling or gross deformity. Extremities: No CCE. Neurologic: Alert.  Oriented to place person.  Answers questions appropriately.  Paucity of speech. Skin: She is, no sores. Tattoos: None observed. Nodes: No cervical or inguinal adenopathy. Psych: Affect dull but not obviously depressed.  Not anxious.  Cooperative.  Calm.  Intake/Output from previous day: 10/23 0701 - 10/24 0700 In: 1125.4 [I.V.:1024.9; IV Piggyback:100.5] Out: 1503 [Urine:1400; Drains:103] Intake/Output this shift: Total I/O In: 576 [I.V.:476; IV Piggyback:100] Out: 33 [Drains:33]  LAB RESULTS: No results for input(s): WBC, HGB, HCT, PLT in the last 72 hours. BMET Lab Results  Component Value Date   NA 138 12/14/2017   NA 138 12/11/2017   NA 138 12/01/2017   K 4.2 12/14/2017   K 3.8 12/11/2017   K 3.2 (L) 12/01/2017   CL 108 12/14/2017   CL 105 12/11/2017   CL 105 12/01/2017   CO2 23 12/14/2017   CO2 23 12/11/2017   CO2 24 12/01/2017   GLUCOSE 102 (H) 12/14/2017   GLUCOSE 98 12/11/2017   GLUCOSE 132 (H) 12/01/2017   BUN <5 (L) 12/14/2017   BUN <5 (L) 12/11/2017   BUN <5 (L) 12/01/2017   CREATININE 0.66 12/14/2017   CREATININE 0.59 12/11/2017   CREATININE 0.43 (L) 12/01/2017   CALCIUM 9.1 12/14/2017   CALCIUM 8.8 (L) 12/11/2017   CALCIUM 8.1 (L)  12/01/2017   LFT No results for input(s): PROT, ALBUMIN, AST, ALT, ALKPHOS, BILITOT, BILIDIR, IBILI in the last 72 hours. PT/INR Lab Results  Component Value Date   INR 1.02 11/26/2017   Hepatitis Panel No results for input(s): HEPBSAG, HCVAB, HEPAIGM, HEPBIGM in the last 72 hours. C-Diff No components found for: CDIFF Lipase  No results found for: LIPASE  Drugs of Abuse     Component Value Date/Time   LABOPIA NONE DETECTED 11/27/2017 0400   COCAINSCRNUR NONE DETECTED 11/27/2017 0400   LABBENZ NONE DETECTED 11/27/2017 0400   AMPHETMU NONE DETECTED 11/27/2017 0400   THCU NONE DETECTED 11/27/2017 0400   LABBARB NONE DETECTED 11/27/2017 0400     RADIOLOGY STUDIES: No results found.   IMPRESSION:   *   Diarrhea.  C diff negative.  Even that the symptoms only started after the surgery and lots of meds were started strongly suspect it is either antibiotics or meds. No previous colonoscopy.    *   Ruptured cerebral aneurysm with subarachnoid hemorrhage.  Status post coiling and ventriculostomy tube placement.  Shunt dependent.    PLAN:     *   Will she need to stay on Unasyn indefinitely?  Anyway this can be discontinued? Stop the Cipro after 7 days. Ask that her neuro surgeon scrutinize her med list and discontinue any unnecessary medications. Patient and family not interested in colonoscopy at this point.  Do not think it is indicated. ?  Abdominal imaging with plain films versus CT scan?.  Suspect these will be unrevealing but might be helpful in ascertaining an etiology.   Azucena Freed  12/18/2017, 4:02 PM Phone [phone]

## 2017-12-19 DIAGNOSIS — R197 Diarrhea, unspecified: Secondary | ICD-10-CM

## 2017-12-19 NOTE — Progress Notes (Signed)
Physical Therapy Treatment Patient Details Name: Sheryl James MRN: [id number] DOB: 04-Dec-1955 Today's Date: 12/19/2017    History of Present Illness 62 y.o. female with recent HAs dx with 10.7 x6 x 56mm basilar apex aneurysm, s/p coiling 10/3, IVC. Pt with syncopal event during therapy on 12/12/17 with possible seizure, stat CT ordered.     PT Comments    Slow improvements.  More focused on task.  Still with significant gait instability.    Follow Up Recommendations  CIR;Supervision/Assistance - 24 hour     Equipment Recommendations  Rolling walker with 5" wheels    Recommendations for Other Services       Precautions / Restrictions Precautions Precautions: Fall Precaution Comments: EVD must be clamped, monitor vitals closely    Mobility  Bed Mobility Overal bed mobility: Needs Assistance Bed Mobility: Sit to Supine     Supine to sit: Min assist     General bed mobility comments: increased time and minor stability assist, but pt boosted herself to EOB once up.  Transfers Overall transfer level: Needs assistance Equipment used: None Transfers: Sit to/from Stand Sit to Stand: Min assist         General transfer comment: cues for hand placement stability assist  Ambulation/Gait Ambulation/Gait assistance: Mod assist;+2 physical assistance Gait Distance (Feet): 140 Feet Assistive device: 2 person hand held assist Gait Pattern/deviations: Step-through pattern Gait velocity: slower Gait velocity interpretation: <1.31 ft/sec, indicative of household ambulator General Gait Details: retropulsive and moderate list to the Left.  Short steps with occasional scissoring.   Stairs             Wheelchair Mobility    Modified Rankin (Stroke Patients Only) Modified Rankin (Stroke Patients Only) Modified Rankin: Moderately severe disability     Balance Overall balance assessment: Needs assistance   Sitting balance-Leahy Scale: Fair       Standing  balance-Leahy Scale: Poor Standing balance comment: needs external support                            Cognition Arousal/Alertness: Awake/alert Behavior During Therapy: WFL for tasks assessed/performed Overall Cognitive Status: Impaired/Different from baseline(much improved, not tested formally)                                        Exercises      General Comments        Pertinent Vitals/Pain Pain Assessment: Faces Faces Pain Scale: No hurt    Home Living                      Prior Function            PT Goals (current goals can now be found in the care plan section) Acute Rehab PT Goals Patient Stated Goal: none stated PT Goal Formulation: Patient unable to participate in goal setting Time For Goal Achievement: 12/26/17 Potential to Achieve Goals: Good Progress towards PT goals: Progressing toward goals    Frequency    Min 3X/week      PT Plan Current plan remains appropriate    Co-evaluation              AM-PAC PT "6 Clicks" Daily Activity  Outcome Measure  Difficulty turning over in bed (including adjusting bedclothes, sheets and blankets)?: Unable Difficulty moving from lying on back to sitting on  the side of the bed? : Unable Difficulty sitting down on and standing up from a chair with arms (e.g., wheelchair, bedside commode, etc,.)?: Unable Help needed moving to and from a bed to chair (including a wheelchair)?: A Little Help needed walking in hospital room?: A Lot Help needed climbing 3-5 steps with a railing? : Total 6 Click Score: 9    End of Session         PT Visit Diagnosis: Difficulty in walking, not elsewhere classified (R26.2)     Time: 8850-2774 PT Time Calculation (min) (ACUTE ONLY): 25 min  Charges:  $Gait Training: 8-22 mins $Therapeutic Activity: 8-22 mins                     12/19/2017  Donnella Sham, PT Acute Rehabilitation Services [phone]  (pager) [phone]   (office)   [name] Lilyahna Sirmon 12/19/2017, 5:13 PM

## 2017-12-19 NOTE — Progress Notes (Signed)
Patient ID: Sheryl James, female   DOB: Oct 01, 1955, 62 y.o.   MRN: [id number] BP (!) 173/80   Pulse 82   Temp 97.8 F (36.6 C) (Oral)   Resp 14   Ht 5\' 2"  (1.575 m)   Wt 70.8 kg   SpO2 95%   BMI 28.55 kg/m  Alert and oriented, ventric at 20 She is doing very well, we'll see how she does through the weekend

## 2017-12-20 ENCOUNTER — Other Ambulatory Visit: Payer: Self-pay

## 2017-12-20 NOTE — Progress Notes (Signed)
Subjective: Patient reports patient doing well awake and alert no headache  Objective: Vital signs in last 24 hours: Temp:  [97.8 F (36.6 C)-98.7 F (37.1 C)] 97.8 F (36.6 C) (10/26 0400) Pulse Rate:  [61-89] 67 (10/26 0600) Resp:  [13-21] 19 (10/26 0600) BP: (124-173)/(50-80) 129/64 (10/26 0600) SpO2:  [78 %-100 %] 100 % (10/26 0600)  Intake/Output from previous day: 10/25 0701 - 10/26 0700 In: 2032.8 [P.O.:954; I.V.:927.1; IV Piggyback:151.7] Out: 1640 [Urine:1600; Drains:40] Intake/Output this shift: No intake/output data recorded.  neurologically nonfocal external ventricular drain in place set at 20 and not draining more than 2 mL.  Lab Results: No results for input(s): WBC, HGB, HCT, PLT in the last 72 hours. BMET No results for input(s): NA, K, CL, CO2, GLUCOSE, BUN, CREATININE, CALCIUM in the last 72 hours.  Studies/Results: No results found.  Assessment/Plan: Clamp EVD over the next 24 hours follow-up CT in the morning maybe we can take out her drain after that  LOS: 24 days     [place] Strieter P 12/20/2017, 8:16 AM

## 2017-12-21 ENCOUNTER — Inpatient Hospital Stay (HOSPITAL_COMMUNITY): Payer: 59

## 2017-12-21 NOTE — Progress Notes (Signed)
Subjective: Patient reports patient well awake and alert complaints of headache this morning  Objective: Vital signs in last 24 hours: Temp:  [97.9 F (36.6 C)-98.7 F (37.1 C)] 98.1 F (36.7 C) (10/27 0300) Pulse Rate:  [63-82] 63 (10/27 0600) Resp:  [8-21] 18 (10/27 0600) BP: (116-156)/(50-88) 148/50 (10/27 0600) SpO2:  [85 %-99 %] 93 % (10/27 0600)  Intake/Output from previous day: 10/26 0701 - 10/27 0700 In: 1726.9 [P.O.:714; I.V.:912.9; IV Piggyback:99.9] Out: 1525 [Urine:1515; Drains:10] Intake/Output this shift: No intake/output data recorded.  awake alert strength 5 out of 5no pronator drift  Lab Results: No results for input(s): WBC, HGB, HCT, PLT in the last 72 hours. BMET No results for input(s): NA, K, CL, CO2, GLUCOSE, BUN, CREATININE, CALCIUM in the last 72 hours.  Studies/Results: Ct Head Wo Contrast  Result Date: 12/21/2017 CLINICAL DATA:  62 y/o  F; hydrocephalus for follow-up. EXAM: CT HEAD WITHOUT CONTRAST TECHNIQUE: Contiguous axial images were obtained from the base of the skull through the vertex without intravenous contrast. COMPARISON:  12/15/2017 CT head. FINDINGS: Brain: Diffusely increased hydrocephalus and periventricular interstitial edema. For example, third ventricle measures 13 mm mL, previously 11 mm. Increased diffuse sulcal effacement. No herniation at this time. Stable position of right frontal approach ventriculostomy catheter with tip in the frontal horn of right lateral ventricle. No acute intracranial hemorrhage, stroke, or focal mass effect identified. Stable very small chronic infarctions within the cerebellum and right caudate head. Vascular: Calcific atherosclerosis of carotid siphons. Basilar tip coil mass noted with streak artifact partially obscuring brain at that level. Skull: Stable postsurgical changes related to right frontal burr hole. Sinuses/Orbits: No acute finding. Other: None. IMPRESSION: 1. Mildly increased increased  hydrocephalus, periventricular interstitial edema, and sulcal effacement. No herniation at this time. 2. Stable position of right frontal approach ventriculostomy catheter. Electronically Signed   By: Kristine Garbe M.D.   On: 12/21/2017 05:21    Assessment/Plan: Patient is stable neurologically. CT scan however that shows slight increased size of her third ventricle. Unclear whether she's, and being shunt dependent. Her drain was now opened and 20 for 24 hours leading into being clamped for 24 hours and patient neurologically unchanged. We are seeing slight increase in ventricular size. I feel the patient probably can tolerate having the drain removed however I think this probably should be done temporally related to being prepared to a ventricular. A shunt if needed. So I am going to leave the drain and one more day depending on Dr. Christella Noa schedule, he may want to remove the shunt a day or 2 before he plans shunting if needed.  LOS: 25 days     Toribio Seiber P 12/21/2017, 8:00 AM

## 2017-12-22 NOTE — Progress Notes (Addendum)
Physical Therapy Treatment Patient Details Name: Sheryl James MRN: [id number] DOB: 1955/11/05 Today's Date: 12/22/2017    History of Present Illness 62 y.o. 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 Pt with syncopal event during therapy on 12/12/17 with possible seizure. PMHx: HTN    PT Comments    Pt pleasant with flat affect and willing to mobilize. Pt with continued retropulsion with gait with left lean and progressed from mod to min assist with use of RW, tactile and verbal cues. Pt with kyphotic trunk with forward head and unable to correct posture or anterior translation with verbal cues alone. However, with tactile cues of 2 fingers at scapula pt able to maintain gait with Rw. End of session performed static standing with pt again unable to complete anterior translation with just verbal cues. After heel raises in standing pt with improved anterior translation and would suggest initiating session with heel raises to condition pt toward anterior translation prior to transfers. Spouse present throughout session.     Follow Up Recommendations  CIR;Supervision/Assistance - 24 hour     Equipment Recommendations  Rolling walker with 5" wheels    Recommendations for Other Services       Precautions / Restrictions Precautions Precautions: Fall Precaution Comments: EVD currently clamped Restrictions Weight Bearing Restrictions: No    Mobility  Bed Mobility          General bed mobility comments: in chair on arrival  Transfers Overall transfer level: Needs assistance Transfers: Sit to/from Stand Sit to Stand: Min guard         General transfer comment: cues for hand placement, anterior translation and increased time with 2 trials from chair without physical assist  Ambulation/Gait Ambulation/Gait assistance: Mod assist;Min assist Gait Distance (Feet): 200 Feet Assistive device: Rolling walker (2 wheeled);None Gait  Pattern/deviations: Step-through pattern;Decreased stride length;Trunk flexed   Gait velocity interpretation: <1.8 ft/sec, indicate of risk for recurrent falls General Gait Details: retropulsive and moderate list to the Left with mod assist to control trunk and assist with anterior translation for grossly 60' transitioned to RW with progression to min assist with tactile cues at scapula for anterior weight shift and translation   Stairs             Wheelchair Mobility    Modified Rankin (Stroke Patients Only) Modified Rankin (Stroke Patients Only) Pre-Morbid Rankin Score: No symptoms Modified Rankin: Moderately severe disability     Balance Overall balance assessment: Needs assistance Sitting-balance support: Feet supported;No upper extremity supported Sitting balance-Leahy Scale: Fair    Standing balance support: No upper extremity supported;During functional activity Standing balance-Leahy Scale: Poor Standing balance comment: external cues for anterior translation with min assist for prolonged standing                            Cognition Arousal/Alertness: Awake/alert Behavior During Therapy: Flat affect Overall Cognitive Status: Impaired/Different from baseline Area of Impairment: Following commands;Memory;Safety/judgement;Problem solving                   Current Attention Level: Sustained Memory: (increased time to recall some personal information) Following Commands: Follows one step commands with increased time Safety/Judgement: Decreased awareness of safety;Decreased awareness of deficits   Problem Solving: Requires verbal cues;Slow processing;Requires tactile cues General Comments: VCs for safe hand placement for sit<>stand. Talks when spoken too, but no intiation of conversation noted during session with  myself or husband      Exercises General Exercises - Lower Extremity Heel Raises: AROM;10 reps;Standing;Both    General Comments         Pertinent Vitals/Pain Pain Assessment: No/denies pain    Home Living                      Prior Function            PT Goals (current goals can now be found in the care plan section) Progress towards PT goals: Progressing toward goals    Frequency    Min 3X/week      PT Plan Current plan remains appropriate    Co-evaluation              AM-PAC PT "6 Clicks" Daily Activity  Outcome Measure  Difficulty turning over in bed (including adjusting bedclothes, sheets and blankets)?: A Lot Difficulty moving from lying on back to sitting on the side of the bed? : Unable Difficulty sitting down on and standing up from a chair with arms (e.g., wheelchair, bedside commode, etc,.)?: A Little Help needed moving to and from a bed to chair (including a wheelchair)?: A Little Help needed walking in hospital room?: A Lot Help needed climbing 3-5 steps with a railing? : Total 6 Click Score: 12    End of Session Equipment Utilized During Treatment: Gait belt Activity Tolerance: Patient tolerated treatment well Patient left: in chair;with call bell/phone within reach;with chair alarm set;with family/visitor present Nurse Communication: Mobility status PT Visit Diagnosis: Difficulty in walking, not elsewhere classified (R26.2);Other symptoms and signs involving the nervous system (R29.898)     Time: 8841-6606 PT Time Calculation (min) (ACUTE ONLY): 29 min  Charges:  $Gait Training: 8-22 mins $Neuromuscular Re-education: 8-22 mins                     [place], PT Acute Rehabilitation Services Pager: [phone] Office: [place] 12/22/2017, 10:13 AM

## 2017-12-22 NOTE — Progress Notes (Signed)
Occupational Therapy Treatment Patient Details Name: Sheryl James MRN: [id number] DOB: 09-30-1955 Today's Date: 12/22/2017    History of present illness 62 y.o. 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 Pt with syncopal event during therapy on 12/12/17 with possible seizure, stat CT ordered.    OT comments  This 62 yo female admitted with above presents to acute OT making progress with grooming done in standing and following directions for LBD. She will continue to benefit from acute OT with follow up OT on CIR to get to S level or better to return home with husband.  Follow Up Recommendations  CIR;Supervision/Assistance - 24 hour       Recommendations for Other Services Rehab consult    Precautions / Restrictions Precautions Precautions: Fall Precaution Comments: EVD must be clamped--currently clamped Restrictions Weight Bearing Restrictions: No       Mobility Bed Mobility Overal bed mobility: Needs Assistance Bed Mobility: Sit to Supine     Supine to sit: Min assist(tendency for posterior lean and/or right lateral lean)        Transfers Overall transfer level: Needs assistance Equipment used: 1 person hand held assist Transfers: Sit to/from Stand Sit to Stand: Min assist         General transfer comment: Min A to ambulate from bed to recliner about 3 feet    Balance Overall balance assessment: Needs assistance Sitting-balance support: Feet supported;No upper extremity supported   Sitting balance - Comments: Fair at EOB, Good in recliner (able to doff/donn socks sitting at front of recliner without LOB   Standing balance support: No upper extremity supported;During functional activity Standing balance-Leahy Scale: Poor Standing balance comment: needs external support (standing to brush teeth and wash face)                           ADL either performed or assessed with clinical judgement   ADL  Overall ADL's : Needs assistance/impaired     Grooming: Wash/dry face;Oral care;Set up;Supervision/safety;Standing Grooming Details (indicate cue type and reason): min-mod A for standing balance with tendency to lean posteriorly and/or to right, able to self correct 75% of time without cues, squenced brushing teeth without issues               Lower Body Dressing Details (indicate cue type and reason): able to follow directions to doff/don right sock then left Toilet Transfer: Minimal assistance;Ambulation Toilet Transfer Details (indicate cue type and reason): 3 feet from bed to recliner   Toileting - Clothing Manipulation Details (indicate cue type and reason): Total A back peri care             Vision Baseline Vision/History: Wears glasses Wears Glasses: At all times            Cognition Arousal/Alertness: Awake/alert Behavior During Therapy: Flat affect Overall Cognitive Status: Impaired/Different from baseline Area of Impairment: Following commands;Memory;Safety/judgement;Problem solving                     Memory: (increased time to recall some personal information) Following Commands: Follows multi-step commands with increased time Safety/Judgement: Decreased awareness of safety;Decreased awareness of deficits   Problem Solving: Requires verbal cues;Slow processing General Comments: VCs for safe hand placement for sit<>stand. Talks when spoken too, but no intiation of conversation noted during session with myself or husband  Pertinent Vitals/ Pain       Pain Assessment: No/denies pain         Frequency  Min 2X/week        Progress Toward Goals  OT Goals(current goals can now be found in the care plan section)  Progress towards OT goals: Progressing toward goals     Plan Discharge plan remains appropriate       AM-PAC PT "6 Clicks" Daily Activity     Outcome Measure   Help from another person eating meals?: A  Little Help from another person taking care of personal grooming?: A Little Help from another person toileting, which includes using toliet, bedpan, or urinal?: A Lot Help from another person bathing (including washing, rinsing, drying)?: A Little Help from another person to put on and taking off regular upper body clothing?: A Little Help from another person to put on and taking off regular lower body clothing?: A Little 6 Click Score: 17    End of Session Equipment Utilized During Treatment: Gait belt  OT Visit Diagnosis: Unsteadiness on feet (R26.81);Muscle weakness (generalized) (M62.81);Other symptoms and signs involving cognitive function   Activity Tolerance Patient tolerated treatment well   Patient Left in chair;with call bell/phone within reach;with chair alarm set;with family/visitor present           Time: 7544-9201 OT Time Calculation (min): 46 min  Charges: OT General Charges $OT Visit: 1 Visit OT Treatments $Self Care/Home Management : 38-52 mins  [place] [address], OTR/L Acute [employer] Pager [phone] Office [phone]      Almon Register 12/22/2017, 8:57 AM

## 2017-12-22 NOTE — Progress Notes (Signed)
Patient ID: Sheryl James, female   DOB: 10-13-1955, 62 y.o.   MRN: [id number] BP 122/84   Pulse 79   Temp 98.4 F (36.9 C) (Oral)   Resp 17   Ht 5\' 2"  (1.575 m)   Wt 70.8 kg   SpO2 96%   BMI 28.55 kg/m  Alert and oriented, following commands Moving all extremities Removed her drain accidentally, will follow closely. Her husband believes she is less engaged.  ventric site is clean, no signs of infection

## 2017-12-23 NOTE — Progress Notes (Signed)
Patient ID: Sheryl James, female   DOB: 01-07-1956, 62 y.o.   MRN: [id number] BP 124/68   Pulse 78   Temp 98 F (36.7 C) (Oral)   Resp 19   Ht 5\' 2"  (1.575 m)   Wt 70.8 kg   SpO2 95%   BMI 28.55 kg/m  Alert and oriented x 4, speech is clear, and fluent Following commands, moving all extremities Will obtain a rehab consult Candidate for CIR

## 2017-12-23 NOTE — Progress Notes (Signed)
Nutrition Follow-up  DOCUMENTATION CODES:   Not applicable  INTERVENTION:   Magic cup TID with meals, each supplement provides 290 kcal and 9 grams of protein   D/C Ensure Enlive po BID, each supplement provides 350 kcal and 20 grams of protein   Encourage intake at meals  NUTRITION DIAGNOSIS:   Inadequate oral intake related to decreased appetite, early satiety as evidenced by meal completion < 50%. Ongoing.   GOAL:   Patient will meet greater than or equal to 90% of their needs Progressing.   MONITOR:   PO intake, Supplement acceptance  ASSESSMENT:   Pt admitted after a seizure with a SAH s/p coil embolization and EVD placed 10/3 which was replaced 10/12.    Pt discussed during ICU rounds and with RN.   Per husband pt's meal completion remains <50%.  He also reports she takes very little of the ensure.  Per neurosurgery notes, EVD came out, pt being monitored  Medications reviewed and include: florastor  NS with 20 mEq KCl/L @ 50 ml/hr Labs reviewed EVD: out UOP: 1800 ml  I/O: -7288 ml since admit   NUTRITION - FOCUSED PHYSICAL EXAM:  Completed, no fat or muscle depletion. No edema noted.   Diet Order:   Diet Order            Diet regular Room service appropriate? Yes; Fluid consistency: Thin  Diet effective now              EDUCATION NEEDS:   Education needs have been addressed  Skin:  Skin Assessment: (head incision)  Last BM:  10/27 medium  Height:   Ht Readings from Last 1 Encounters:  11/26/17 5\' 2"  (1.575 m)    Weight:   Wt Readings from Last 1 Encounters:  12/01/17 70.8 kg    Ideal Body Weight:  50 kg  BMI:  Body mass index is 28.55 kg/m.  Estimated Nutritional Needs:   Kcal:  1500-1700  Protein:  75-90 grams  Fluid:  > 1.5 L/day  Maylon Peppers RD, LDN, CNSC [phone] Pager [phone] After Hours Pager

## 2017-12-24 DIAGNOSIS — I69398 Other sequelae of cerebral infarction: Secondary | ICD-10-CM

## 2017-12-24 DIAGNOSIS — R269 Unspecified abnormalities of gait and mobility: Secondary | ICD-10-CM

## 2017-12-24 DIAGNOSIS — I608 Other nontraumatic subarachnoid hemorrhage: Secondary | ICD-10-CM

## 2017-12-24 DIAGNOSIS — I69019 Unspecified symptoms and signs involving cognitive functions following nontraumatic subarachnoid hemorrhage: Secondary | ICD-10-CM

## 2017-12-24 NOTE — Consult Note (Signed)
Physical Medicine and Rehabilitation Consult Reason for Consult: Decreased functional mobility Referring Physician: Dr. Christella Noa   HPI: Sheryl James is a 62 y.o. right-handed female with history of tobacco abuse on no prescription medications.  Presented 11/26/2017 with persistent headaches over the previous past few weeks with associated nausea, no vomiting.  She had initially been seen at [facility] a few weeks prior and work-up negative was discharged home.  During work-up noted hypertensive advised to follow-up with her PCP.  She went to [facility] due to persistent headaches dizziness she was placed on antibiotic treatment for possible otitis media.  On day of admission 11/26/2017 patient became unresponsive while eating breakfast with jerking movements lasting 20 to 30 seconds with apparent seizure.  Cranial CT scan showed subarachnoid hemorrhage tracking in the midline of each frontal lobe as well as along the middle cerebral artery distribution tracks bilaterally.  Mild hemorrhage layering in each lateral ventricle.  Prior small infarcts in the head of the caudate nucleus on the right and in the left thalamus.  There was also appearance concerning for possible aneurysm rupture at the level of the anterior communicating artery.  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  Patient underwent right frontal ventricular catheter placement 11/27/2017 per Dr. Christella Noa.  Diagnostic cerebral angiogram 11/27/2017 by Dr. 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  No significant vasospasm seen.  Follow-up cranial CT scan showed continued enlarged ventricles and underwent ventriculostomy drain in the right lateral ventricle 12/06/2017.  Patient with persistent bouts of diarrhea felt to be initially related to antibiotic therapy with followed by gastroenterology services.  C. difficile  negative and bowel program regulated with improvement and no further bouts of recurrent diarrhea.  She is tolerating a regular diet.  Patient remains on [place] for seizure prophylaxis.  Intermittent bouts of urinary retention maintain on Urecholine as well as Flomax.  Therapy evaluations completed with recommendations of physical medicine rehab consult.  Patient has been up in chair at side of bed for about an hour according to her husband.  Review of Systems  Constitutional: Negative for chills and fever.  HENT: Negative for hearing loss.   Eyes: Negative for blurred vision and double vision.  Respiratory: Negative for cough and shortness of breath.   Cardiovascular: Negative for chest pain, palpitations and leg swelling.  Gastrointestinal: Positive for diarrhea and nausea.  Genitourinary: Negative for dysuria and hematuria.  Musculoskeletal: Positive for myalgias.  Skin: Negative for rash.  Neurological: Positive for dizziness, seizures and headaches.  All other systems reviewed and are negative.  History reviewed. No pertinent past medical history. Past Surgical History:  Procedure Laterality Date  . IR ANGIO INTRA EXTRACRAN SEL INTERNAL CAROTID BILAT MOD SED  11/27/2017  . IR ANGIO VERTEBRAL SEL VERTEBRAL UNI L MOD SED  11/27/2017  . IR ANGIOGRAM FOLLOW UP STUDY  11/27/2017  . IR ANGIOGRAM FOLLOW UP STUDY  11/27/2017  . IR ANGIOGRAM FOLLOW UP STUDY  11/27/2017  . IR ANGIOGRAM FOLLOW UP STUDY  11/27/2017  . IR TRANSCATH/EMBOLIZ  11/27/2017  . IR US GUIDE VASC ACCESS RIGHT  11/27/2017  . RADIOLOGY WITH ANESTHESIA N/A 11/27/2017   Procedure: IR WITH ANESTHESIA;  Surgeon: Consuella Lose, MD;  Location: [place];  Service: Radiology;  Laterality: N/A;   History reviewed. No pertinent family history. Social History:  reports  that she has been smoking cigarettes. She has been smoking about 0.50 packs per day. She has never used smokeless tobacco. She reports that she does not drink alcohol or  use drugs. Allergies: No Known Allergies Medications Prior to Admission  Medication Sig Dispense Refill  . ibuprofen (ADVIL,MOTRIN) 200 MG tablet Take 600 mg by mouth every 6 (six) hours as needed for headache or moderate pain.      Home: Home Living Family/patient expects to be discharged to:: Private residence Living Arrangements: Spouse/significant other Available Help at Discharge: Family, Available PRN/intermittently Type of Home: House Home Access: Stairs to enter Technical brewer of Steps: 3 Home Layout: One level Bathroom Shower/Tub: Multimedia programmer: Programmer, systems: Yes  Lives With: Spouse  Functional History: Prior Function Level of Independence: Independent Comments: drove; managed finances; medicaiton Functional Status:  Mobility: Bed Mobility Overal bed mobility: Needs Assistance Bed Mobility: Sit to Supine Rolling: Min guard Sidelying to sit: Min assist Supine to sit: Min assist(tendency for posterior lean and/or right lateral lean) Sit to supine: Supervision, +2 for safety/equipment Sit to sidelying: Mod assist, Max assist, +2 for physical assistance, +2 for safety/equipment General bed mobility comments: in chair on arrival Transfers Overall transfer level: Needs assistance Equipment used: 1 person hand held assist Transfers: Sit to/from Stand Sit to Stand: Min guard Stand pivot transfers: Max assist, +2 physical assistance General transfer comment: cues for hand placement, anterior translation and increased time with 2 trials from chair without physical assist Ambulation/Gait Ambulation/Gait assistance: Mod assist, Min assist Gait Distance (Feet): 200 Feet Assistive device: Rolling walker (2 wheeled), None Gait Pattern/deviations: Step-through pattern, Decreased stride length, Trunk flexed General Gait Details: retropulsive and moderate list to the Left with mod assist to control trunk and assist with anterior  translation for grossly 60' transitioned to RW with progression to min assist with tactile cues at scapula for anterior weight shift and translation Gait velocity: slower Gait velocity interpretation: <1.8 ft/sec, indicate of risk for recurrent falls    ADL: ADL Overall ADL's : Needs assistance/impaired Eating/Feeding: Maximal assistance Grooming: Wash/dry face, Oral care, Set up, Supervision/safety, Standing Grooming Details (indicate cue type and reason): min-mod A for standing balance with tendency to lean posteriorly and/or to right, able to self correct 75% of time without cues, squenced brushing teeth without issues Upper Body Bathing: Maximal assistance, Sitting Lower Body Bathing: Total assistance Upper Body Dressing : Total assistance, Bed level Lower Body Dressing: Maximal assistance, Sitting/lateral leans Lower Body Dressing Details (indicate cue type and reason): able to follow directions to doff/don right sock then left Toilet Transfer: Minimal assistance, Ambulation Toilet Transfer Details (indicate cue type and reason): 3 feet from bed to recliner Toileting- Clothing Manipulation and Hygiene: +2 for safety/equipment, Minimal assistance, Sit to/from stand Toileting - Clothing Manipulation Details (indicate cue type and reason): Total A back peri care Functional mobility during ADLs: Minimal assistance, +2 for safety/equipment, Rolling walker General ADL Comments: slow processing and assist with walker mgmt/safety  Cognition: Cognition Overall Cognitive Status: Impaired/Different from baseline Arousal/Alertness: Lethargic Orientation Level: Oriented to person, Disoriented to situation, Oriented to place, Disoriented to time Attention: Sustained Sustained Attention: Impaired Sustained Attention Impairment: Functional basic Memory: Impaired Memory Impairment: Storage deficit Awareness: Impaired Cognition Arousal/Alertness: Awake/alert Behavior During Therapy: Flat  affect Overall Cognitive Status: Impaired/Different from baseline Area of Impairment: Following commands, Memory, Safety/judgement, Problem solving Orientation Level: (improved, stated 2012, PT said 21yrs ago, pt said 2019) Current Attention Level: Sustained Memory: (increased time to recall  some personal information) Following Commands: Follows one step commands with increased time Safety/Judgement: Decreased awareness of safety, Decreased awareness of deficits Awareness: Intellectual Problem Solving: Requires verbal cues, Slow processing, Requires tactile cues General Comments: VCs for safe hand placement for sit<>stand. Talks when spoken too, but no intiation of conversation noted during session with myself or husband  Blood pressure (!) 129/57, pulse 75, temperature 98.5 F (36.9 C), temperature source Oral, resp. rate 19, height 5\' 2"  (1.575 m), weight 70.8 kg, SpO2 94 %. Physical Exam  HENT:  Ventriculostomy site is clean and dry  Eyes:  Pupils reactive to light  Neck: Normal range of motion. Neck supple. No thyromegaly present.  Cardiovascular: Normal rate and regular rhythm.  Respiratory: Effort normal and breath sounds normal. No respiratory distress.  GI: Soft. Bowel sounds are normal. She exhibits no distension.  Neurological: She is alert.  Patient provides her name and place with minimal delay.  Needed minimal cues for age.  She was able to provide the name of hospital.  Follows simple commands  Not oriented to day date month or year.  States that she is at [facility] Motor strength is 5/5 bilateral deltoid bicep tricep grip hip flexor knee extensor ankle dorsiflexor Cerebellar no signs of dysmetria on finger-nose-finger testing or heel-to-shin testing Speech with increased latency response but no evidence of dysarthria or aphasia.  She has intact naming. Scalp stable from ventriculostomy site, no drainage or erythema. GU has Foley  No results found for this or  any previous visit (from the past 24 hour(s)). No results found.   Assessment/Plan: Diagnosis: Subarachnoid hemorrhage from aneurysmal bleed, status post basilar tip aneurysm coiling with decreased balance and cognitive deficits 1. Does the need for close, 24 hr/day medical supervision in concert with the patient's rehab needs make it unreasonable for this patient to be served in a less intensive setting? Yes 2. Co-Morbidities requiring supervision/potential complications: Dysphagia for pills, urinary retention 3. Due to bladder management, bowel management, safety, skin/wound care, disease management, medication administration, pain management and patient education, does the patient require 24 hr/day rehab nursing? Yes 4. Does the patient require coordinated care of a physician, rehab nurse, PT (1-2 hrs/day, 5 days/week), OT (1-2 hrs/day, 5 days/week) and SLP (.5-1 hrs/day, 5 days/week) to address physical and functional deficits in the context of the above medical diagnosis(es)? Yes Addressing deficits in the following areas: balance, endurance, locomotion, strength, transferring, bowel/bladder control, bathing, dressing, feeding, grooming, toileting, cognition and psychosocial support 5. Can the patient actively participate in an intensive therapy program of at least 3 hrs of therapy per day at least 5 days per week? Yes 6. The potential for patient to make measurable gains while on inpatient rehab is excellent 7. Anticipated functional outcomes upon discharge from inpatient rehab are supervision  with PT, supervision with OT, supervision with SLP. 8. Estimated rehab length of stay to reach the above functional goals is: 14 to 20 days 9. Anticipated D/C setting: Home 10. Anticipated post D/C treatments: [place] therapy 11. Overall Rehab/Functional Prognosis: excellent  RECOMMENDATIONS: This patient's condition is appropriate for continued rehabilitative care in the following setting: CIR Patient  has agreed to participate in recommended program. Yes Note that insurance prior authorization may be required for reimbursement for recommended care.  Comment:  "I have personally performed a face to face diagnostic evaluation of this patient.  Additionally, I have reviewed and concur with the physician assistant's documentation above." Charlett Blake M.D. [place]  FAAPM&R (Sports Med, Neuromuscular Med) Diplomate Am Board of Electrodiagnostic Med  Cathlyn Parsons, PA-C 12/24/2017

## 2017-12-24 NOTE — Progress Notes (Signed)
Inpatient Rehabilitation Admissions Coordinator  I met with patient, her spouse and sister in law at bedside. I discussed goals an expectations of an inpt rehab admission and ht projected need for 24/7 supervision after rehab due to her cognitive deficits. Spouse currently on FMLA and will need to discuss with family how they would provide for this. I will follow up tomorrow. I will begin insurance authorization in case spouse can arrange caregiver support.  Danne Baxter, RN, MSN Rehab Admissions Coordinator [phone] 12/24/2017 1:14 PM

## 2017-12-24 NOTE — H&P (Signed)
Physical Medicine and Rehabilitation Admission H&P    Chief Complaint  Patient presents with  . Seizures  : HPI: Sheryl James is a 62 year old right-handed female history of tobacco abuse on no prescription medications.  Per chart review patient lives with spouse.  Independent driving.  One level home with 3 steps to entry.  Presented 11/26/2017 with persistent headaches over the previous past few weeks with associated nausea, no vomiting.  She had initially been seen at [facility] long a few weeks ago prior and work-up negative and she was discharged home.  During work-up noted hypertensive advised to follow-up with her PCP.  She went to [facility] due to persistent headaches dizziness she was placed on antibiotic treatment for possible otitis media.  On day of admission 11/26/2017 patient became unresponsive while eating breakfast with jerking movements lasting 20 to 30 seconds with apparent seizure.  Cranial CT scan showed subarachnoid hemorrhage tracking in the midline of each frontal lobe as well as along the middle cerebral artery distribution tracks bilaterally.  Mild hemorrhage layering in each lateral ventricle.  Prior small infarcts in the head of the caudate nucleus on the right and the left thalamus.  There was also appearance concerning for possible aneurysm rupture at the level of the anterior communicating artery.  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  Patient underwent right frontal ventricular catheter placement 11/27/2017 per Dr. Christella Noa.  Diagnostic cerebral angiogram 11/27/2017 per Dr. 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  No significant vasospasm seen.  Follow-up cranial CT scan showed continued enlargement ventricles and underwent ventriculostomy drain in the right lateral ventricle 12/06/2017.  Urine study grade 100,000 Pseudomonas maintained on  Cipro.  Patient with persistent bouts of diarrhea felt to be initially related to antibiotic therapy with followed by gastroenterology services.  C. difficile negative and bowel program regulated with improvement and no further bouts of recurrent diarrhea.  Her diet has been advanced to regular.  She remains on Keppra for seizure prophylaxis.  Intermittent bouts of urinary retention maintained on Urecholine as well as Flomax.  Therapy evaluations completed with recommendations of physical medicine rehab consult.  Patient was admitted for a comprehensive rehab program.  Review of Systems  Constitutional: Negative for chills and fever.  HENT: Negative for hearing loss.   Eyes: Negative for blurred vision and double vision.  Respiratory: Negative for shortness of breath.   Cardiovascular: Negative for chest pain and palpitations.  Gastrointestinal: Positive for diarrhea and nausea.  Genitourinary: Negative for dysuria, flank pain and hematuria.  Musculoskeletal: Positive for joint pain and myalgias.  Skin: Negative for rash.  Neurological: Positive for dizziness, seizures and headaches.  All other systems reviewed and are negative.  History reviewed. No pertinent past medical history. Past Surgical History:  Procedure Laterality Date  . IR ANGIO INTRA EXTRACRAN SEL INTERNAL CAROTID BILAT MOD SED  11/27/2017  . IR ANGIO VERTEBRAL SEL VERTEBRAL UNI L MOD SED  11/27/2017  . IR ANGIOGRAM FOLLOW UP STUDY  11/27/2017  . IR ANGIOGRAM FOLLOW UP STUDY  11/27/2017  . IR ANGIOGRAM FOLLOW UP STUDY  11/27/2017  . IR ANGIOGRAM FOLLOW UP STUDY  11/27/2017  . IR TRANSCATH/EMBOLIZ  11/27/2017  . IR US GUIDE VASC ACCESS RIGHT  11/27/2017  . RADIOLOGY WITH ANESTHESIA N/A 11/27/2017   Procedure: IR WITH ANESTHESIA;  Surgeon: Consuella Lose, MD;  Location: [place];  Service: Radiology;  Laterality: N/A;   History reviewed. No pertinent family history. Social History:  reports that she has been smoking cigarettes. She  has been smoking about 0.50 packs per day. She has never used smokeless tobacco. She reports that she does not drink alcohol or use drugs. Allergies: No Known Allergies Medications Prior to Admission  Medication Sig Dispense Refill  . ibuprofen (ADVIL,MOTRIN) 200 MG tablet Take 600 mg by mouth every 6 (six) hours as needed for headache or moderate pain.      Drug Regimen Review Drug regimen was reviewed and remains appropriate with no significant issues identified  Home: Home Living Family/patient expects to be discharged to:: Private residence Living Arrangements: Spouse/significant other Available Help at Discharge: Family, Available PRN/intermittently Type of Home: House Home Access: Stairs to enter Technical brewer of Steps: 3 Home Layout: One level Bathroom Shower/Tub: Multimedia programmer: Programmer, systems: Yes  Lives With: Spouse   Functional History: Prior Function Level of Independence: Independent Comments: drove; managed finances; medicaiton  Functional Status:  Mobility: Bed Mobility Overal bed mobility: Needs Assistance Bed Mobility: Sit to Supine Rolling: Min guard Sidelying to sit: Min assist Supine to sit: Min assist(tendency for posterior lean and/or right lateral lean) Sit to supine: Supervision, +2 for safety/equipment Sit to sidelying: Mod assist, Max assist, +2 for physical assistance, +2 for safety/equipment General bed mobility comments: in chair on arrival Transfers Overall transfer level: Needs assistance Equipment used: 1 person hand held assist Transfers: Sit to/from Stand Sit to Stand: Min guard Stand pivot transfers: Max assist, +2 physical assistance General transfer comment: cues for hand placement, anterior translation and increased time with 2 trials from chair without physical assist Ambulation/Gait Ambulation/Gait assistance: Mod assist, Min assist Gait Distance (Feet): 200 Feet Assistive device:  Rolling walker (2 wheeled), None Gait Pattern/deviations: Step-through pattern, Decreased stride length, Trunk flexed General Gait Details: retropulsive and moderate list to the Left with mod assist to control trunk and assist with anterior translation for grossly 60' transitioned to RW with progression to min assist with tactile cues at scapula for anterior weight shift and translation Gait velocity: slower Gait velocity interpretation: <1.8 ft/sec, indicate of risk for recurrent falls    ADL: ADL Overall ADL's : Needs assistance/impaired Eating/Feeding: Maximal assistance Grooming: Wash/dry face, Oral care, Set up, Supervision/safety, Standing Grooming Details (indicate cue type and reason): min-mod A for standing balance with tendency to lean posteriorly and/or to right, able to self correct 75% of time without cues, squenced brushing teeth without issues Upper Body Bathing: Maximal assistance, Sitting Lower Body Bathing: Total assistance Upper Body Dressing : Total assistance, Bed level Lower Body Dressing: Maximal assistance, Sitting/lateral leans Lower Body Dressing Details (indicate cue type and reason): able to follow directions to doff/don right sock then left Toilet Transfer: Minimal assistance, Ambulation Toilet Transfer Details (indicate cue type and reason): 3 feet from bed to recliner Toileting- Clothing Manipulation and Hygiene: +2 for safety/equipment, Minimal assistance, Sit to/from stand Toileting - Clothing Manipulation Details (indicate cue type and reason): Total A back peri care Functional mobility during ADLs: Minimal assistance, +2 for safety/equipment, Rolling walker General ADL Comments: slow processing and assist with walker mgmt/safety  Cognition: Cognition Overall Cognitive Status: Impaired/Different from baseline Arousal/Alertness: Lethargic Orientation Level: Oriented to person, Oriented to place, Disoriented to situation, Disoriented to time Attention:  Sustained Sustained Attention: Impaired Sustained Attention Impairment: Functional basic Memory: Impaired Memory Impairment: Storage deficit Awareness: Impaired Cognition Arousal/Alertness: Awake/alert Behavior During  Therapy: Flat affect Overall Cognitive Status: Impaired/Different from baseline Area of Impairment: Following commands, Memory, Safety/judgement, Problem solving Orientation Level: (improved, stated 2012, PT said 66yrs ago, pt said 2019) Current Attention Level: Sustained Memory: (increased time to recall some personal information) Following Commands: Follows one step commands with increased time Safety/Judgement: Decreased awareness of safety, Decreased awareness of deficits Awareness: Intellectual Problem Solving: Requires verbal cues, Slow processing, Requires tactile cues General Comments: VCs for safe hand placement for sit<>stand. Talks when spoken too, but no intiation of conversation noted during session with myself or husband  Physical Exam: Blood pressure (!) 139/124, pulse 80, temperature 98.1 F (36.7 C), temperature source Oral, resp. rate (!) 21, height 5\' 2"  (1.575 m), weight 70.8 kg, SpO2 95 %. Physical Exam  Constitutional: No distress.  HENT:  Scalp shaved. Staples intact in entry sites. No drainage  Eyes: Pupils are equal, round, and reactive to light. EOM are normal.  Neck: Normal range of motion. No tracheal deviation present. No thyromegaly present.  Cardiovascular: Normal rate.  No murmur heard. Respiratory: Effort normal. No respiratory distress. She has no wheezes. She has rales.  GI: Soft. She exhibits no distension. There is no tenderness. There is no rebound.  Musculoskeletal:  Full ROM all 4's  Neurological: She is alert.  Flat, follows simple commands. Delays in processing. Cannot answer biographical information. Moves all 4's 4/5. Sensation intact to LT, pain in all 4's.   Skin: She is not diaphoretic.  Psychiatric:  flat    No  results found for this or any previous visit (from the past 48 hour(s)). No results found.     Medical Problem List and Plan: 1.  Decreased functional mobility with cognitive deficits secondary to bilateral medial frontal/temporal subarachnoid hemorrhage from aneurysmal bleed. Pt is status post basilar tip aneurysm coiling  -admitting to inpatient rehab 2.  DVT Prophylaxis/Anticoagulation: SCDs 3. Pain Management: Ultram as needed 4. Mood: Provide emotional support 5. Neuropsych: This patient is capable of making decisions on her own behalf. 6. Skin/Wound Care: Routine skin checks 7. Fluids/Electrolytes/Nutrition: Routine in and outs with follow-up chemistries 8.  Seizure prophylaxis.  Keppra 500 mg twice daily 9.  Urinary retention.  Urecholine 3 times daily, Flomax 0.4 mg daily.  Check PVR x3 10.  Diarrhea.  Improved.  Continue Imodium as needed as well as Florastor  -encourage PO 11.  Tobacco abuse.  Provide counseling 12.  Pseudomonas UTI.  Completed course of Cipro. 13. Unresponsive episode this AM. ?syncopal vs seizure activity.   -pt at baseline neurologically after event.  -monitor closely  -encourage fluids and po intake   Post Admission Physician Evaluation: 1. Functional deficits secondary  to bi-frontal/temporal SAH. 2. Patient is admitted to receive collaborative, interdisciplinary care between the physiatrist, rehab nursing staff, and therapy team. 3. Patient's level of medical complexity and substantial therapy needs in context of that medical necessity cannot be provided at a lesser intensity of care such as a SNF. 4. Patient has experienced substantial functional loss from his/her baseline which was documented above under the "Functional History" and "Functional Status" headings.  Judging by the patient's diagnosis, physical exam, and functional history, the patient has potential for functional progress which will result in measurable gains while on inpatient rehab.   These gains will be of substantial and practical use upon discharge  in facilitating mobility and self-care at the household level. 5. Physiatrist will provide 24 hour management of medical needs as well as oversight of the therapy plan/treatment and  provide guidance as appropriate regarding the interaction of the two. 6. The Preadmission Screening has been reviewed and patient status is unchanged unless otherwise stated above. 7. 24 hour rehab nursing will assist with bladder management, bowel management, safety, skin/wound care, disease management, medication administration, pain management and patient education  and help integrate therapy concepts, techniques,education, etc. 8. PT will assess and treat for/with: Lower extremity strength, range of motion, stamina, balance, functional mobility, safety, adaptive techniques and equipment, NMR, cognitive perceptual rx, family ed.   Goals are: supervision. 9. OT will assess and treat for/with: ADL's, functional mobility, safety, upper extremity strength, adaptive techniques and equipment, NMR, cognitive perceptual rx, family ed.   Goals are: supervision. Therapy may proceed with showering this patient. 10. SLP will assess and treat for/with: cognition, communication, family ed.  Goals are: supervision. 11. Case Management and Social Worker will assess and treat for psychological issues and discharge planning. 12. Team conference will be held weekly to assess progress toward goals and to determine barriers to discharge. 13. Patient will receive at least 3 hours of therapy per day at least 5 days per week. 14. ELOS: 14-20 days       15. Prognosis:  excellent   I have personally performed a face to face diagnostic evaluation of this patient and formulated the key components of the plan.  Additionally, I have personally reviewed laboratory data, imaging studies, as well as relevant notes and concur with the physician assistant's documentation  above.  Meredith Staggers, MD, FAAPMR    Lavon Paganini [place], PA-C 12/24/2017

## 2017-12-24 NOTE — Progress Notes (Signed)
Physical Therapy Treatment Patient Details Name: Sheryl James MRN: [id number] DOB: Jun 14, 1955 Today's Date: 12/24/2017    History of Present Illness 62 y.o. female with recent HAs dx with 10/7 6 x 17mm basilar apex aneurysm, s/p coiling 10/3, IVC. Pt with syncopal event during therapy on 12/12/17 with possible seizure. PMHx: HTN    PT Comments    Pt is fatigued this PM and was only able to walk a short distance into the hallway with RW and chair to follow.  Heavy mod assist by the end of gait due to fatigue.  PT will continue to follow acutely for safe mobility progression   Follow Up Recommendations  CIR;Supervision/Assistance - 24 hour     Equipment Recommendations  Rolling walker with 5" wheels    Recommendations for Other Services   NA     Precautions / Restrictions Precautions Precautions: Fall Precaution Comments: EVD is out    Mobility  Bed Mobility Overal bed mobility: Needs Assistance Bed Mobility: Sit to Supine       Sit to supine: Min assist   General bed mobility comments: Min assist to help her completely lift both legs back into the bed.  Cues to scoot back before laying down as she was too close to the edge.  Transfers Overall transfer level: Needs assistance Equipment used: Rolling walker (2 wheeled) Transfers: Sit to/from Stand Sit to Stand: Min assist         General transfer comment: Min assist to stand from low chair with verbal and manual assist for safe hand placement on armrests of the chair.  Preformed x 2.  Ambulation/Gait Ambulation/Gait assistance: Mod assist;+2 safety/equipment Gait Distance (Feet): 75 Feet Assistive device: Rolling walker (2 wheeled) Gait Pattern/deviations: Step-through pattern;Decreased stride length   Gait velocity interpretation: <1.8 ft/sec, indicate of risk for recurrent falls General Gait Details: Pt with retropulsive/sway back posture with gait, as well as left lateral lean.  Cues for hand placement on RW,  assist to weight shift and bring trunk forward over her feet.  She is able to follow the cues/manual assist in static standing, but the second we start walking again, she cannot maintain midline posture.  Pt was very fatigued after a long day up in the chair this PM, so we were not able to walk as far before her legs got too weak and pt rode the chair back to her room.        Modified Rankin (Stroke Patients Only) Modified Rankin (Stroke Patients Only) Pre-Morbid Rankin Score: No symptoms Modified Rankin: Moderately severe disability     Balance Overall balance assessment: Needs assistance Sitting-balance support: Feet supported;Bilateral upper extremity supported Sitting balance-Leahy Scale: Fair     Standing balance support: Bilateral upper extremity supported Standing balance-Leahy Scale: Poor                              Cognition Arousal/Alertness: Awake/alert Behavior During Therapy: Flat affect Overall Cognitive Status: Impaired/Different from baseline Area of Impairment: Orientation;Attention;Memory;Following commands;Safety/judgement;Awareness;Problem solving                 Orientation Level: Disoriented to;Person(does not recognize her sister in law) Current Attention Level: Sustained Memory: Decreased recall of precautions;Decreased short-term memory Following Commands: Follows one step commands with increased time Safety/Judgement: Decreased awareness of safety;Decreased awareness of deficits Awareness: Intellectual Problem Solving: Slow processing;Decreased initiation General Comments: Requires multimodal cues to complete task and extra time to process.  Pt  is unable to tell me who her sister in law is today.          General Comments General comments (skin integrity, edema, etc.): VSS throughout session.       Pertinent Vitals/Pain Pain Assessment: No/denies pain           PT Goals (current goals can now be found in the care plan  section) Acute Rehab PT Goals Patient Stated Goal: none stated Progress towards PT goals: Progressing toward goals    Frequency    Min 3X/week      PT Plan Current plan remains appropriate       AM-PAC PT "6 Clicks" Daily Activity  Outcome Measure  Difficulty turning over in bed (including adjusting bedclothes, sheets and blankets)?: Unable Difficulty moving from lying on back to sitting on the side of the bed? : Unable Difficulty sitting down on and standing up from a chair with arms (e.g., wheelchair, bedside commode, etc,.)?: Unable Help needed moving to and from a bed to chair (including a wheelchair)?: A Little Help needed walking in hospital room?: A Lot Help needed climbing 3-5 steps with a railing? : Total 6 Click Score: 9    End of Session Equipment Utilized During Treatment: Gait belt Activity Tolerance: Patient limited by fatigue Patient left: in bed;with call bell/phone within reach;with bed alarm set   PT Visit Diagnosis: Difficulty in walking, not elsewhere classified (R26.2);Other symptoms and signs involving the nervous system (V37.106)     Time: 2694-8546 PT Time Calculation (min) (ACUTE ONLY): 23 min  Charges:  $Gait Training: 8-22 mins $Therapeutic Activity: 8-22 mins                    Otto Caraway B. Elana Jian, PT, DPT  Acute Rehabilitation [phone] pager #[phone] office   12/24/2017, 4:54 PM

## 2017-12-25 MED ORDER — ORAL CARE MOUTH RINSE
15.0000 mL | Freq: Two times a day (BID) | OROMUCOSAL | Status: DC
  Administered 2017-12-25 – 2017-12-26 (×3): 15 mL via OROMUCOSAL

## 2017-12-25 NOTE — PMR Pre-admission (Signed)
PMR Admission Coordinator Pre-Admission Assessment  Patient: Sheryl James is an 62 y.o., female MRN: [id number] DOB: [birth year] Height: 5\' 2"  (157.5 cm) Weight: 70.8 kg              Insurance Information HMO:     PPO: yes     PCP:      IPA:      80/20:      OTHER:  PRIMARY: [employer]      Policy#: [id number]      Subscriber: pt CM Name: Ernestine A.      Phone#: [phone]     Fax#: [phone] Pre-Cert#: [id number]      Employer:  Benefits:  Phone #: [phone]     Name: 12/24/17 Eff. Date: 02/25/2017     Deduct: $200      Out of Pocket Max: $2500      Life Max: none CIR: 90%      SNF: 100% 90 days Outpatient: $10 per visit     Co-Pay: 60 visits combined Home Health: 100%      Co-Pay: visits per medical neccesity DME: 80%     Co-Pay: 20% Providers: in network  SECONDARY: none     Medicaid Application Date:       Case Manager:  Disability Application Date:       Case Worker:   Emergency Facilities manager Information    Name Relation Home Work Mobile   Barua,PHILLIP Spouse   [phone]   Rivka Spring Daughter   [phone]   Parente,Rachael Daughter   [phone]     Current Medical History  Patient Admitting Diagnosis: [place]  History of Present Illness: Sheryl James is a 62 year old right-handed female history of tobacco abuse on no prescription medications.  Presented 11/26/2017 with persistent headaches over the previous past few weeks with associated nausea, no vomiting.  She had initially been seen at [facility] long a few weeks ago prior and work-up negative and she was discharged home.  During work-up noted hypertensive advised to follow-up with her PCP.  She went to [facility] due to persistent headaches dizziness she was placed on antibiotic treatment for possible otitis media.  On day of admission 11/26/2017 patient became unresponsive while eating breakfast with jerking movements lasting 20 to 30 seconds with apparent seizure.  Cranial CT  scan showed subarachnoid hemorrhage tracking in the midline of each frontal lobe as well as along the middle cerebral artery distribution tracks bilaterally.  Mild hemorrhage layering in each lateral ventricle.  Prior small infarcts in the head of the caudate nucleus on the right and the left thalamus.  There was also appearance concerning for possible aneurysm rupture at the level of the anterior communicating artery.  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  Patient underwent right frontal ventricular catheter placement 11/27/2017 per Dr. Christella Noa.  Diagnostic cerebral angiogram 11/27/2017 per Dr. 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  No significant vasospasm seen.  Follow-up cranial CT scan showed continued enlargement ventricles and underwent ventriculostomy drain in the right lateral ventricle 12/06/2017.  Urine study grade 100,000 Pseudomonas maintained on Cipro.  Patient with persistent bouts of diarrhea felt to be initially related to antibiotic therapy with followed by gastroenterology services.  C. difficile negative and bowel program regulated with improvement and no further bouts of recurrent diarrhea.  Her diet has been advanced to  regular.  She remains on Keppra for seizure prophylaxis.  Intermittent bouts of urinary retention maintained on Urecholine as well as Flomax.    Patient with staring episode with lack of verbal response after therapy session today 12/26/2017. Incontinent of BM and vomited. RN notified Dr. Christella Noa and orders received. Pt alert and verbal in chair prior to CIR admit with spouse at bedside. Dr. Naaman Plummer in to assess patient prior to approval to admit to CIR.  Complete NIHSS TOTAL: 1    Past Medical History  History reviewed. No pertinent past medical history.  Family History  family history is not on file.  Prior Rehab/Hospitalizations:  Has the  patient had major surgery during 100 days prior to admission? No  Current Medications   Current Facility-Administered Medications:  .   stroke: mapping our early stages of recovery book, , Does not apply, Once, Ashok Pall, MD .  0.9 %  sodium chloride infusion, , Intravenous, PRN, Ashok Pall, MD, Stopped at 12/24/17 2217 .  acetaminophen (TYLENOL) tablet 650 mg, 650 mg, Oral, Q4H PRN, 650 mg at 12/19/17 1841 **OR** acetaminophen (TYLENOL) solution 650 mg, 650 mg, Per Tube, Q4H PRN, 650 mg at 12/12/17 1137 **OR** acetaminophen (TYLENOL) suppository 650 mg, 650 mg, Rectal, Q4H PRN, Ashok Pall, MD, 650 mg at 11/29/17 0411 .  bethanechol (URECHOLINE) tablet 10-50 mg, 10-50 mg, Oral, TID, Consuella Lose, MD, 50 mg at 12/26/17 1008 .  Gerhardt's butt cream, , Topical, BID, Consuella Lose, MD .  levETIRAcetam (KEPPRA) tablet 500 mg, 500 mg, Oral, BID, Ashok Pall, MD, 500 mg at 12/26/17 1009 .  loperamide HCl (IMODIUM) 1 MG/7.5ML suspension 2 mg, 2 mg, Oral, PRN, Viona Gilmore D, NP, 2 mg at 12/18/17 2259 .  MEDLINE mouth rinse, 15 mL, Mouth Rinse, BID, Cabbell, Kyle, MD, 15 mL at 12/25/17 2200 .  ondansetron (ZOFRAN-ODT) disintegrating tablet 4 mg, 4 mg, Oral, Q6H PRN **OR** ondansetron (ZOFRAN) injection 4 mg, 4 mg, Intravenous, Q6H PRN, Ashok Pall, MD, 4 mg at 12/26/17 1008 .  pantoprazole (PROTONIX) EC tablet 40 mg, 40 mg, Oral, Daily, 40 mg at 12/25/17 0936 **OR** [DISCONTINUED] pantoprazole sodium (PROTONIX) 40 mg/20 mL oral suspension 40 mg, 40 mg, Per Tube, Daily, Ashok Pall, MD, 40 mg at 12/22/17 0941 .  saccharomyces boulardii (FLORASTOR) capsule 250 mg, 250 mg, Oral, BID, Bergman, Meghan D, NP, 250 mg at 12/26/17 1008 .  tamsulosin (FLOMAX) capsule 0.4 mg, 0.4 mg, Oral, Daily, Costella, Vista Mink, PA-C, Stopped at 12/26/17 1017 .  traMADol (ULTRAM) tablet 50-100 mg, 50-100 mg, Oral, Q6H PRN, Ashok Pall, MD  Patients Current Diet:  Diet Order            Diet  regular Room service appropriate? 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 None of the above  Prior Functional Level Prior Function Level of Independence: Independent Comments: drove; managed finances; medicaiton  Self Care: Did the patient need help bathing, dressing, using the toilet or eating?  Independent  Indoor Mobility: Did the  patient need assistance with walking from room to room (with or without device)? Independent  Stairs: Did the patient need assistance with internal or external stairs (with or without device)? Independent  Functional Cognition: Did the patient need help planning regular tasks such as shopping or remembering to take medications? Independent  Current Functional Level Cognition  Arousal/Alertness: Awake/alert Overall Cognitive Status: Impaired/Different from baseline Current Attention Level: Sustained Orientation Level: Oriented to person, Oriented to place, Oriented to situation, Disoriented to time(pt states correct month but wrong yt) Following Commands: Follows one step commands with increased time Safety/Judgement: Decreased awareness of safety, Decreased awareness of deficits General Comments: Requires multimodal cues to complete task and extra time to process.  End of session pt with 2 episodes of staring with increased posterior lean and lack of response to therapist grossly 10 sec each Attention: Sustained Sustained Attention: Impaired Sustained  Attention Impairment: Functional basic Memory: Impaired Memory Impairment: Storage deficit Awareness: Impaired    Extremity Assessment (includes Sensation/Coordination)  Upper Extremity Assessment: Generalized weakness  Lower Extremity Assessment: Defer to PT evaluation    ADLs  Overall ADL's : Needs assistance/impaired Eating/Feeding: Maximal assistance Grooming: Wash/dry face, Oral care, Set up, Supervision/safety, Standing Grooming Details (indicate cue type and reason): min-mod A for standing balance with tendency to lean posteriorly and/or to right, able to self correct 75% of time without cues, squenced brushing teeth without issues Upper Body Bathing: Maximal assistance, Sitting Lower Body Bathing: Total assistance Upper Body Dressing : Total assistance, Bed level Lower Body Dressing: Maximal assistance, Sitting/lateral leans Lower Body Dressing Details (indicate cue type and reason): able to follow directions to doff/don right sock then left Toilet Transfer: Minimal assistance, Ambulation Toilet Transfer Details (indicate cue type and reason): 3 feet from bed to recliner Toileting- Clothing Manipulation and Hygiene: +2 for safety/equipment, Minimal assistance, Sit to/from stand Toileting - Clothing Manipulation Details (indicate cue type and reason): Total A back peri care Functional mobility during ADLs: Minimal assistance, +2 for safety/equipment, Rolling walker General ADL Comments: slow processing and assist with walker mgmt/safety    Mobility  Overal bed mobility: Needs Assistance Bed Mobility: Supine to Sit Rolling: Min guard Sidelying to sit: Min assist Supine to sit: Min assist Sit to supine: Min assist Sit to sidelying: Mod assist, Max assist, +2 for physical assistance, +2 for safety/equipment General bed mobility comments: min assist with multimodal cues to elevate trunk from surface and balance at EOB    Transfers  Overall transfer level: Needs  assistance Equipment used: Rolling walker (2 wheeled) Transfers: Sit to/from Stand Sit to Stand: Min assist Stand pivot transfers: Max assist, +2 physical assistance General transfer comment: min assist to stand from bed, minguard to stand from chair x 3 trials with min-max assist to control descent to surface due to 2 periods of staring episode with lack of response or assist and lowered to chair    Ambulation / Gait / Stairs / Wheelchair Mobility  Ambulation/Gait Ambulation/Gait assistance: Min assist, +2 safety/equipment Gait Distance (Feet): 225 Feet Assistive device: Rolling walker (2 wheeled) Gait Pattern/deviations: Step-through pattern, Decreased stride length, Narrow base of support General Gait Details: pt maintains left posterior bias during gait and able to maintain anterior translation with min assist with cueing at scapula. Periods of grossly 5 sec pt able to maintain gait with assist to advance RW but then drifts back into retropulsion without tactile cues. Max cues for looking up, direction and safety with chair to follow for safety.  Gait velocity: slower  Gait velocity interpretation: <1.8 ft/sec, indicate of risk for recurrent falls    Posture / Balance Dynamic Sitting Balance Sitting balance - Comments: EOB with posterior lean with verbal cues to translate posture anteriorly and keep feet on the floor Balance Overall balance assessment: Needs assistance Sitting-balance support: Feet supported, Bilateral upper extremity supported Sitting balance-Leahy Scale: Fair Sitting balance - Comments: EOB with posterior lean with verbal cues to translate posture anteriorly and keep feet on the floor Postural control: Posterior lean Standing balance support: Bilateral upper extremity supported Standing balance-Leahy Scale: Poor Standing balance comment: external cues for anterior translation with min assist for prolonged standing. 2 periods of staring today with need for max assist  as this occurred    Special needs/care consideration BiPAP/CPAP n/a CPM n/a Continuous Drip IV n/a Dialysis n/a Life Vest n/a Oxygen n/a Special Bed n/a Trach Size n/a Wound Vac n/a Skin surgical incision to head with staples Bowel mgmt: LBM 10/30; bouts with diarrhea; continent Bladder mgmt: indwelling catheter Diabetic mgmt n/a Decreased safety awareness   Previous Home Environment Living Arrangements: Spouse/significant other  Lives With: Spouse Available Help at Discharge: Family, Available 24 hours/day(family have arranged 24/7 as recommended with family and fri) Type of Home: House Home Layout: One level Home Access: Stairs to enter Technical brewer of Steps: 3 Bathroom Shower/Tub: Multimedia programmer: Standard Bathroom Accessibility: Yes How Accessible: Accessible via walker [place]: No  Discharge Living Setting Plans for Discharge Living Setting: Patient's home, Lives with (comment)(spouse) Type of Home at Discharge: House Discharge Home Layout: One level Discharge Home Access: Stairs to enter Entrance Stairs-Rails: None Entrance Stairs-Number of Steps: 3 Discharge Bathroom Shower/Tub: Horticulturist, commercial: Standard Discharge Bathroom Accessibility: Yes How Accessible: Accessible via walker Does the patient have any problems obtaining your medications?: No  Social/Family/Support Systems Patient Roles: Spouse, Parent Contact Information: Doren Custard, spouse Anticipated Caregiver: sposue, family and friends for the supervsiion recommended Anticipated Caregiver's Contact Information: see above Ability/Limitations of Caregiver: sposue works 7 days on; 7 days off. Currently on FMLA Caregiver Availability: 24/7 Discharge Plan Discussed with Primary Caregiver: Yes Is Caregiver In Agreement with Plan?: Yes Does Caregiver/Family have Issues with Lodging/Transportation while Pt is in Rehab?: No  Goals/Additional  Needs Patient/Family Goal for Rehab: supervision with PT, OT, and SLP Expected length of stay: [facility] 14 to 20 days Special Service Needs: decrreased safety awareness Pt/Family Agrees to Admission and willing to participate: Yes Program Orientation Provided & Reviewed with Pt/Caregiver Including Roles  & Responsibilities: Yes  Decrease burden of Care through IP rehab admission: n/a  Possible need for SNF placement upon discharge: not anticipated  Patient Condition: This patient's condition remains as documented in the consult dated 12/24/2017, in which the Rehabilitation Physician determined and documented that the patient's condition is appropriate for intensive rehabilitative care in an inpatient rehabilitation facility. Will admit to inpatient rehab today.  Preadmission Screen Completed By: Grover Canavan, 12/26/2017 11:19 AM ______________________________________________________________________   Discussed status with Dr. Naaman Plummer  On 12/26/2017 at  1118 and received telephone approval for admission today.  Admission Coordinator: Danne Baxter  Cleatrice Burke, time [id number] Date 12/26/2017

## 2017-12-25 NOTE — Progress Notes (Signed)
Inpatient Rehabilitation Admissions Coordinator  I await insurance approval to admit pt to inpt rehab today. I met with pt and her spouse at bedside and they are in agreement and aware. Spouse has arranged caregiver support for after CIR.  Danne Baxter, RN, MSN Rehab Admissions Coordinator [phone] 12/25/2017 2:10 PM

## 2017-12-25 NOTE — Progress Notes (Signed)
Patient ID: Sheryl James, female   DOB: 1955-09-29, 62 y.o.   MRN: [id number] BP (!) 159/135   Pulse 77   Temp 97.7 F (36.5 C) (Oral)   Resp 19   Ht 5\' 2"  (1.575 m)   Wt 70.8 kg   SpO2 95%   BMI 28.55 kg/m  Alert and oriented Waiting for placement Doing well overall

## 2017-12-26 ENCOUNTER — Inpatient Hospital Stay (HOSPITAL_COMMUNITY)
Admission: RE | Admit: 2017-12-26 | Discharge: 2018-01-08 | DRG: 057 | Disposition: A | Discharge status: Home Health | Payer: 59 | Source: Intra-hospital | Attending: Physical Medicine & Rehabilitation | Admitting: Physical Medicine & Rehabilitation

## 2017-12-26 ENCOUNTER — Encounter (HOSPITAL_COMMUNITY): Payer: Self-pay

## 2017-12-26 ENCOUNTER — Other Ambulatory Visit: Payer: Self-pay

## 2017-12-26 DIAGNOSIS — E46 Unspecified protein-calorie malnutrition: Secondary | ICD-10-CM

## 2017-12-26 DIAGNOSIS — F17210 Nicotine dependence, cigarettes, uncomplicated: Secondary | ICD-10-CM | POA: Diagnosis present

## 2017-12-26 DIAGNOSIS — Z298 Encounter for other specified prophylactic measures: Secondary | ICD-10-CM | POA: Diagnosis not present

## 2017-12-26 DIAGNOSIS — R569 Unspecified convulsions: Secondary | ICD-10-CM | POA: Diagnosis present

## 2017-12-26 DIAGNOSIS — A499 Bacterial infection, unspecified: Secondary | ICD-10-CM | POA: Diagnosis not present

## 2017-12-26 DIAGNOSIS — I69319 Unspecified symptoms and signs involving cognitive functions following cerebral infarction: Secondary | ICD-10-CM

## 2017-12-26 DIAGNOSIS — E8809 Other disorders of plasma-protein metabolism, not elsewhere classified: Secondary | ICD-10-CM | POA: Diagnosis not present

## 2017-12-26 DIAGNOSIS — I69019 Unspecified symptoms and signs involving cognitive functions following nontraumatic subarachnoid hemorrhage: Secondary | ICD-10-CM | POA: Diagnosis not present

## 2017-12-26 DIAGNOSIS — Z2989 Encounter for other specified prophylactic measures: Secondary | ICD-10-CM

## 2017-12-26 DIAGNOSIS — I10 Essential (primary) hypertension: Secondary | ICD-10-CM | POA: Diagnosis not present

## 2017-12-26 DIAGNOSIS — N390 Urinary tract infection, site not specified: Secondary | ICD-10-CM | POA: Diagnosis present

## 2017-12-26 DIAGNOSIS — R197 Diarrhea, unspecified: Secondary | ICD-10-CM | POA: Diagnosis not present

## 2017-12-26 DIAGNOSIS — B965 Pseudomonas (aeruginosa) (mallei) (pseudomallei) as the cause of diseases classified elsewhere: Secondary | ICD-10-CM | POA: Diagnosis present

## 2017-12-26 DIAGNOSIS — R339 Retention of urine, unspecified: Secondary | ICD-10-CM | POA: Diagnosis not present

## 2017-12-26 DIAGNOSIS — I609 Nontraumatic subarachnoid hemorrhage, unspecified: Secondary | ICD-10-CM

## 2017-12-26 MED ORDER — ACETAMINOPHEN 160 MG/5ML PO SOLN: 650.0000 mg | ORAL | Status: DC | PRN

## 2017-12-26 MED ORDER — ACETAMINOPHEN 650 MG RE SUPP: 650.0000 mg | RECTAL | Status: DC | PRN

## 2017-12-26 MED ORDER — SORBITOL 70 % SOLN
30.0000 mL | Freq: Every day | Status: DC | PRN
  Filled 2017-12-26: qty 30

## 2017-12-26 MED ORDER — ACETAMINOPHEN 325 MG PO TABS: 650.0000 mg | ORAL_TABLET | ORAL | Status: DC | PRN

## 2017-12-26 MED ORDER — LOPERAMIDE HCL 1 MG/7.5ML PO SUSP
2.0000 mg | ORAL | Status: DC | PRN
  Filled 2017-12-26: qty 15

## 2017-12-26 MED ORDER — PANTOPRAZOLE SODIUM 40 MG PO TBEC
40.0000 mg | DELAYED_RELEASE_TABLET | Freq: Every day | ORAL | Status: DC
  Administered 2017-12-27 – 2018-01-08 (×13): 40 mg via ORAL
  Filled 2017-12-26 (×13): qty 1

## 2017-12-26 MED ORDER — ONDANSETRON 4 MG PO TBDP: 4.0000 mg | ORAL_TABLET | Freq: Four times a day (QID) | ORAL | Status: DC | PRN

## 2017-12-26 MED ORDER — BETHANECHOL CHLORIDE 10 MG PO TABS
10.0000 mg | ORAL_TABLET | Freq: Three times a day (TID) | ORAL | Status: DC
  Administered 2017-12-26 – 2018-01-02 (×22): 10 mg via ORAL
  Filled 2017-12-26 (×21): qty 1

## 2017-12-26 MED ORDER — GERHARDT'S BUTT CREAM
TOPICAL_CREAM | Freq: Two times a day (BID) | CUTANEOUS | Status: DC
  Administered 2017-12-26 – 2018-01-08 (×25): via TOPICAL
  Filled 2017-12-26: qty 1

## 2017-12-26 MED ORDER — TRAMADOL HCL 50 MG PO TABS: 50.0000 mg | ORAL_TABLET | Freq: Four times a day (QID) | ORAL | Status: DC | PRN

## 2017-12-26 MED ORDER — LEVETIRACETAM 500 MG PO TABS
500.0000 mg | ORAL_TABLET | Freq: Two times a day (BID) | ORAL | Status: DC
  Administered 2017-12-26 – 2018-01-06 (×22): 500 mg via ORAL
  Filled 2017-12-26 (×22): qty 1

## 2017-12-26 MED ORDER — ONDANSETRON HCL 4 MG/2ML IJ SOLN: 4.0000 mg | Freq: Four times a day (QID) | INTRAMUSCULAR | Status: DC | PRN

## 2017-12-26 MED ORDER — BETHANECHOL CHLORIDE 10 MG PO TABS
10.0000 mg | ORAL_TABLET | Freq: Three times a day (TID) | ORAL | Status: DC
  Filled 2017-12-26: qty 3

## 2017-12-26 MED ORDER — TAMSULOSIN HCL 0.4 MG PO CAPS
0.4000 mg | ORAL_CAPSULE | Freq: Every day | ORAL | Status: DC
  Administered 2017-12-27 – 2018-01-05 (×10): 0.4 mg via ORAL
  Filled 2017-12-26 (×10): qty 1

## 2017-12-26 MED ORDER — SACCHAROMYCES BOULARDII 250 MG PO CAPS
250.0000 mg | ORAL_CAPSULE | Freq: Two times a day (BID) | ORAL | Status: DC
  Administered 2017-12-26 – 2018-01-08 (×26): 250 mg via ORAL
  Filled 2017-12-26 (×26): qty 1

## 2017-12-26 NOTE — Progress Notes (Signed)
Cristina Gong, RN  Rehab Admission Coordinator  Physical Medicine and Rehabilitation  PMR Pre-admission  Signed  Date of Service:  12/25/2017 2:15 PM       Related encounter: ED to Hosp-Admission (Discharged) from 11/26/2017 in [place] NEURO/TRAUMA/SURGICAL ICU      Signed         Show:Clear all [x] Manual[x] Template[x] Copied  Added by: [x] Cristina Gong, RN  [] Hover for details PMR Admission Coordinator Pre-Admission Assessment  Patient: Sheryl James is an 62 y.o., female MRN: [id number] DOB: 1955/04/16 Height: 5\' 2"  (157.5 cm) Weight: 70.8 kg                                                                                                                                                  Insurance Information HMO:     PPO: yes     PCP:      IPA:      80/20:      OTHER:  PRIMARY: [employer]      Policy#: [id number]      Subscriber: pt CM Name: Ernestine A.      Phone#: [phone]     Fax#: [phone] Pre-Cert#: [id number]      Employer:  Benefits:  Phone #: [phone]     Name: 12/24/17 Eff. Date: 02/25/2017     Deduct: $200      Out of Pocket Max: $2500      Life Max: none CIR: 90%      SNF: 100% 90 days Outpatient: $10 per visit     Co-Pay: 60 visits combined Home Health: 100%      Co-Pay: visits per medical neccesity DME: 80%     Co-Pay: 20% Providers: in network  SECONDARY: none     Medicaid Application Date:       Case Manager:  Disability Application Date:       Case Worker:   Emergency Publishing copy Information    Name Relation Home Work Mobile   Leu,PHILLIP Spouse   [phone]   Rivka Spring Daughter   [phone]   Parente,Rachael Daughter   [phone]     Current Medical History  Patient Admitting Diagnosis: [place]  History of Present Illness: Sheryl James is a 62 year old right-handed female history of tobacco abuse on no prescription medications.Presented 11/26/2017 with  persistent headaches over the previous past few weeks with associated nausea, no vomiting. She had initially been seen at [facility] long a few weeks ago prior and work-up negative and she was discharged home. During work-up noted hypertensive advised to follow-up with her PCP. She went to [place] walk-in clinic due to persistent headaches dizziness she was placed on antibiotic treatment for possible otitis media. On day of admission 11/26/2017 patient became unresponsive while eating breakfast with jerking movements lasting 20 to 30  seconds with apparent seizure. Cranial CT scan showed subarachnoid hemorrhage tracking in the midline of each frontal lobe as well as along the middle cerebral artery distribution tracks bilaterally. Mild hemorrhage layering in each lateral ventricle. Prior small infarcts in the head of the caudate nucleus on the right and the left thalamus. There was also appearance concerning for possible aneurysm rupture at the level of the anterior communicating artery. 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 Patient underwent right frontal ventricular catheter placement 11/27/2017 per Dr. Christella Noa. Diagnostic cerebral angiogram 11/27/2017 per Dr. 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 No significant vasospasm seen. Follow-up cranial CT scan showed continued enlargement ventricles and underwent ventriculostomy drain in the right lateral ventricle 12/06/2017. Urine study grade 100,000 Pseudomonas maintained on Cipro. Patient with persistent bouts of diarrhea felt to be initially related to antibiotic therapy with followed by gastroenterology services. C. difficile negative and bowel program regulated with improvement and no further bouts of recurrent diarrhea. Her diet has been advanced to regular. She remains on Keppra for seizure prophylaxis. Intermittent bouts of  urinary retention maintained on Urecholine as well as Flomax.   Patient with staring episode with lack of verbal response after therapy session today 12/26/2017. Incontinent of BM and vomited. RN notified Dr. Christella Noa and orders received. Pt alert and verbal in chair prior to CIR admit with spouse at bedside. Dr. Naaman Plummer in to assess patient prior to approval to admit to CIR.  Complete NIHSS TOTAL: 1  Past Medical History  History reviewed. No pertinent past medical history.  Family History  family history is not on file.  Prior Rehab/Hospitalizations:  Has the patient had major surgery during 100 days prior to admission? No  Current Medications   Current Facility-Administered Medications:  .   stroke: mapping our early stages of recovery book, , Does not apply, Once, Ashok Pall, MD .  0.9 %  sodium chloride infusion, , Intravenous, PRN, Ashok Pall, MD, Stopped at 12/24/17 2217 .  acetaminophen (TYLENOL) tablet 650 mg, 650 mg, Oral, Q4H PRN, 650 mg at 12/19/17 1841 **OR** acetaminophen (TYLENOL) solution 650 mg, 650 mg, Per Tube, Q4H PRN, 650 mg at 12/12/17 1137 **OR** acetaminophen (TYLENOL) suppository 650 mg, 650 mg, Rectal, Q4H PRN, Ashok Pall, MD, 650 mg at 11/29/17 0411 .  bethanechol (URECHOLINE) tablet 10-50 mg, 10-50 mg, Oral, TID, Consuella Lose, MD, 50 mg at 12/26/17 1008 .  Gerhardt's butt cream, , Topical, BID, Consuella Lose, MD .  levETIRAcetam (KEPPRA) tablet 500 mg, 500 mg, Oral, BID, Ashok Pall, MD, 500 mg at 12/26/17 1009 .  loperamide HCl (IMODIUM) 1 MG/7.5ML suspension 2 mg, 2 mg, Oral, PRN, Viona Gilmore D, NP, 2 mg at 12/18/17 2259 .  MEDLINE mouth rinse, 15 mL, Mouth Rinse, BID, Cabbell, Kyle, MD, 15 mL at 12/25/17 2200 .  ondansetron (ZOFRAN-ODT) disintegrating tablet 4 mg, 4 mg, Oral, Q6H PRN **OR** ondansetron (ZOFRAN) injection 4 mg, 4 mg, Intravenous, Q6H PRN, Ashok Pall, MD, 4 mg at 12/26/17 1008 .  pantoprazole (PROTONIX) EC  tablet 40 mg, 40 mg, Oral, Daily, 40 mg at 12/25/17 0936 **OR** [DISCONTINUED] pantoprazole sodium (PROTONIX) 40 mg/20 mL oral suspension 40 mg, 40 mg, Per Tube, Daily, Ashok Pall, MD, 40 mg at 12/22/17 0941 .  saccharomyces boulardii (FLORASTOR) capsule 250 mg, 250 mg, Oral, BID, Bergman, Meghan D, NP, 250 mg at 12/26/17 1008 .  tamsulosin (FLOMAX) capsule 0.4 mg, 0.4 mg, Oral, Daily, Costella, Vista Mink, PA-C, Stopped at 12/26/17 1017 .  traMADol (ULTRAM) tablet 50-100 mg, 50-100 mg, Oral, Q6H PRN, Ashok Pall, MD  Patients Current Diet:     Diet Order                  Diet regular Room service appropriate? 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 None of the above  Prior Functional Level Prior Function Level of Independence: Independent Comments: drove; managed finances; medicaiton  Self Care: Did the patient need help bathing, dressing, using the toilet or eating?  Independent  Indoor Mobility: Did the patient need assistance with walking from room to room (with or without device)? Independent  Stairs: Did the patient need assistance with internal or external stairs (with or without device)? Independent  Functional Cognition: Did the patient need help planning regular tasks such as shopping or remembering to take medications? Independent  Current Functional Level Cognition  Arousal/Alertness: Awake/alert Overall Cognitive Status: Impaired/Different from  baseline Current Attention Level: Sustained Orientation Level: Oriented to person, Oriented to place, Oriented to situation, Disoriented to time(pt states correct month but wrong yt) Following Commands: Follows one step commands with increased time Safety/Judgement: Decreased awareness of safety, Decreased awareness of deficits General Comments: Requires multimodal cues to complete task and extra time to process.  End of session pt with 2 episodes of staring with increased posterior lean and lack of response to therapist grossly 10 sec each Attention: Sustained Sustained Attention: Impaired Sustained Attention Impairment: Functional basic Memory: Impaired Memory Impairment: Storage deficit Awareness: Impaired    Extremity Assessment (includes Sensation/Coordination)  Upper Extremity Assessment: Generalized weakness  Lower Extremity Assessment: Defer to PT evaluation    ADLs  Overall ADL's : Needs assistance/impaired Eating/Feeding: Maximal assistance Grooming: Wash/dry face, Oral care, Set up, Supervision/safety, Standing Grooming Details (indicate cue type and reason): min-mod A for standing balance with tendency to lean posteriorly and/or to right, able to self correct 75% of time without cues, squenced brushing teeth without issues Upper Body Bathing: Maximal assistance, Sitting Lower Body Bathing: Total assistance Upper Body Dressing : Total assistance, Bed level Lower Body Dressing: Maximal assistance, Sitting/lateral leans Lower Body Dressing Details (indicate cue type and reason): able to follow directions to doff/don right sock then left Toilet Transfer: Minimal assistance, Ambulation Toilet Transfer Details (indicate cue type and reason): 3 feet from bed to recliner Toileting- Clothing Manipulation and Hygiene: +2 for safety/equipment, Minimal assistance, Sit to/from stand Toileting - Clothing Manipulation Details (indicate cue type and reason): Total A back peri  care Functional mobility during ADLs: Minimal assistance, +2 for safety/equipment, Rolling walker General ADL Comments: slow processing and assist with walker mgmt/safety    Mobility  Overal bed mobility: Needs Assistance Bed Mobility: Supine to Sit Rolling: Min guard Sidelying to sit: Min assist Supine to sit: Min assist Sit to supine: Min assist Sit to sidelying: Mod assist, Max assist, +2 for physical assistance, +2 for safety/equipment General  bed mobility comments: min assist with multimodal cues to elevate trunk from surface and balance at EOB    Transfers  Overall transfer level: Needs assistance Equipment used: Rolling walker (2 wheeled) Transfers: Sit to/from Stand Sit to Stand: Min assist Stand pivot transfers: Max assist, +2 physical assistance General transfer comment: min assist to stand from bed, minguard to stand from chair x 3 trials with min-max assist to control descent to surface due to 2 periods of staring episode with lack of response or assist and lowered to chair    Ambulation / Gait / Stairs / Wheelchair Mobility  Ambulation/Gait Ambulation/Gait assistance: Min assist, +2 safety/equipment Gait Distance (Feet): 225 Feet Assistive device: Rolling walker (2 wheeled) Gait Pattern/deviations: Step-through pattern, Decreased stride length, Narrow base of support General Gait Details: pt maintains left posterior bias during gait and able to maintain anterior translation with min assist with cueing at scapula. Periods of grossly 5 sec pt able to maintain gait with assist to advance RW but then drifts back into retropulsion without tactile cues. Max cues for looking up, direction and safety with chair to follow for safety.  Gait velocity: slower Gait velocity interpretation: <1.8 ft/sec, indicate of risk for recurrent falls    Posture / Balance Dynamic Sitting Balance Sitting balance - Comments: EOB with posterior lean with verbal cues to translate posture  anteriorly and keep feet on the floor Balance Overall balance assessment: Needs assistance Sitting-balance support: Feet supported, Bilateral upper extremity supported Sitting balance-Leahy Scale: Fair Sitting balance - Comments: EOB with posterior lean with verbal cues to translate posture anteriorly and keep feet on the floor Postural control: Posterior lean Standing balance support: Bilateral upper extremity supported Standing balance-Leahy Scale: Poor Standing balance comment: external cues for anterior translation with min assist for prolonged standing. 2 periods of staring today with need for max assist as this occurred    Special needs/care consideration BiPAP/CPAP n/a CPM n/a Continuous Drip IV n/a Dialysis n/a Life Vest n/a Oxygen n/a Special Bed n/a Trach Size n/a Wound Vac n/a Skin surgical incision to head with staples Bowel mgmt: LBM 10/30; bouts with diarrhea; continent Bladder mgmt: indwelling catheter Diabetic mgmt n/a Decreased safety awareness   Previous Home Environment Living Arrangements: Spouse/significant other  Lives With: Spouse Available Help at Discharge: Family, Available 24 hours/day(family have arranged 24/7 as recommended with family and fri) Type of Home: House Home Layout: One level Home Access: Stairs to enter Technical brewer of Steps: 3 Bathroom Shower/Tub: Multimedia programmer: Standard Bathroom Accessibility: Yes How Accessible: Accessible via walker [place]: No  Discharge Living Setting Plans for Discharge Living Setting: Patient's home, Lives with (comment)(spouse) Type of Home at Discharge: House Discharge Home Layout: One level Discharge Home Access: Stairs to enter Entrance Stairs-Rails: None Entrance Stairs-Number of Steps: 3 Discharge Bathroom Shower/Tub: Horticulturist, commercial: Standard Discharge Bathroom Accessibility: Yes How Accessible: Accessible via walker Does the  patient have any problems obtaining your medications?: No  Social/Family/Support Systems Patient Roles: Spouse, Parent Contact Information: Doren Custard, spouse Anticipated Caregiver: sposue, family and friends for the supervsiion recommended Anticipated Caregiver's Contact Information: see above Ability/Limitations of Caregiver: sposue works 7 days on; 7 days off. Currently on FMLA Caregiver Availability: 24/7 Discharge Plan Discussed with Primary Caregiver: Yes Is Caregiver In Agreement with Plan?: Yes Does Caregiver/Family have Issues with Lodging/Transportation while Pt is in Rehab?: No  Goals/Additional Needs Patient/Family Goal for Rehab: supervision with PT, OT, and SLP Expected length of stay: ELOS 14  to 20 days Special Service Needs: decrreased safety awareness Pt/Family Agrees to Admission and willing to participate: Yes Program Orientation Provided & Reviewed with Pt/Caregiver Including Roles  & Responsibilities: Yes  Decrease burden of Care through IP rehab admission: n/a  Possible need for SNF placement upon discharge: not anticipated  Patient Condition: This patient's condition remains as documented in the consult dated 12/24/2017, in which the Rehabilitation Physician determined and documented that the patient's condition is appropriate for intensive rehabilitative care in an inpatient rehabilitation facility. Will admit to inpatient rehab today.  Preadmission Screen Completed By: Grover Canavan, 12/26/2017 11:19 AM ______________________________________________________________________   Discussed status with Dr. Naaman Plummer  On 12/26/2017 at  1118 and received telephone approval for admission today.  Admission Coordinator: Grover Canavan, time 2549 Date 12/26/2017           Cosigned by: Meredith Staggers, MD at 12/26/2017 11:57 AM  Revision History

## 2017-12-26 NOTE — Progress Notes (Signed)
Inpatient Rehabilitation Admissions Coordinator  Came in after therapy session this morning. Please refer to their documentation. RN now at bedside. I have updated Dr. Naaman Plummer of currents events. I have insurance approval and Dr. Naaman Plummer has given approval to admit pt to CIR. Today. Spouse in agreement. RN will contact Dr. Christella Noa for d/c order. I have alerted RN CM and SW. I will make the arrangements to admit today.  Danne Baxter, RN, MSN Rehab Admissions Coordinator [phone] 12/26/2017 10:16 AM

## 2017-12-26 NOTE — Progress Notes (Signed)
Kirsteins, Luanna Salk, MD  Physician  Physical Medicine and Rehabilitation  Consult Note  Signed  Date of Service:  12/24/2017 5:51 AM       Related encounter: ED to Hosp-Admission (Discharged) from 11/26/2017 in [facility] NEURO/TRAUMA/SURGICAL ICU      Signed      Expand All Collapse All    Show:Clear all [x] Manual[x] Template[] Copied  Added by: [x] Angiulli, Lavon Paganini, PA-C[x] Kirsteins, Luanna Salk, MD  [] Hover for details      Physical Medicine and Rehabilitation Consult Reason for Consult: Decreased functional mobility Referring Physician: Dr. Christella Noa   HPI: Sheryl James is a 62 y.o. right-handed female with history of tobacco abuse on no prescription medications.  Presented 11/26/2017 with persistent headaches over the previous past few weeks with associated nausea, no vomiting.  She had initially been seen at [facility] long a few weeks prior and work-up negative was discharged home.  During work-up noted hypertensive advised to follow-up with her PCP.  She went to [facility] walk-in clinic due to persistent headaches dizziness she was placed on antibiotic treatment for possible otitis media.  On day of admission 11/26/2017 patient became unresponsive while eating breakfast with jerking movements lasting 20 to 30 seconds with apparent seizure.  Cranial CT scan showed subarachnoid hemorrhage tracking in the midline of each frontal lobe as well as along the middle cerebral artery distribution tracks bilaterally.  Mild hemorrhage layering in each lateral ventricle.  Prior small infarcts in the head of the caudate nucleus on the right and in the left thalamus.  There was also appearance concerning for possible aneurysm rupture at the level of the anterior communicating artery.  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  Patient underwent right frontal ventricular catheter placement 11/27/2017 per Dr. Christella Noa.   Diagnostic cerebral angiogram 11/27/2017 by Dr. 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  No significant vasospasm seen.  Follow-up cranial CT scan showed continued enlarged ventricles and underwent ventriculostomy drain in the right lateral ventricle 12/06/2017.  Patient with persistent bouts of diarrhea felt to be initially related to antibiotic therapy with followed by gastroenterology services.  C. difficile negative and bowel program regulated with improvement and no further bouts of recurrent diarrhea.  She is tolerating a regular diet.  Patient remains on [place] for seizure prophylaxis.  Intermittent bouts of urinary retention maintain on Urecholine as well as Flomax.  Therapy evaluations completed with recommendations of physical medicine rehab consult.  Patient has been up in chair at side of bed for about an hour according to her husband.  Review of Systems  Constitutional: Negative for chills and fever.  HENT: Negative for hearing loss.   Eyes: Negative for blurred vision and double vision.  Respiratory: Negative for cough and shortness of breath.   Cardiovascular: Negative for chest pain, palpitations and leg swelling.  Gastrointestinal: Positive for diarrhea and nausea.  Genitourinary: Negative for dysuria and hematuria.  Musculoskeletal: Positive for myalgias.  Skin: Negative for rash.  Neurological: Positive for dizziness, seizures and headaches.  All other systems reviewed and are negative.  History reviewed. No pertinent past medical history.      Past Surgical History:  Procedure Laterality Date  . IR ANGIO INTRA EXTRACRAN SEL INTERNAL CAROTID BILAT MOD SED  11/27/2017  . IR ANGIO VERTEBRAL SEL VERTEBRAL UNI L MOD SED  11/27/2017  . IR ANGIOGRAM FOLLOW UP STUDY  11/27/2017  . IR ANGIOGRAM FOLLOW UP STUDY  11/27/2017  . IR ANGIOGRAM FOLLOW UP STUDY  11/27/2017  . IR ANGIOGRAM FOLLOW UP STUDY  11/27/2017  . IR TRANSCATH/EMBOLIZ   11/27/2017  . IR US GUIDE VASC ACCESS RIGHT  11/27/2017  . RADIOLOGY WITH ANESTHESIA N/A 11/27/2017   Procedure: IR WITH ANESTHESIA;  Surgeon: Consuella Lose, MD;  Location: [facility];  Service: Radiology;  Laterality: N/A;   History reviewed. No pertinent family history. Social History:  reports that she has been smoking cigarettes. She has been smoking about 0.50 packs per day. She has never used smokeless tobacco. She reports that she does not drink alcohol or use drugs. Allergies: No Known Allergies       Medications Prior to Admission  Medication Sig Dispense Refill  . ibuprofen (ADVIL,MOTRIN) 200 MG tablet Take 600 mg by mouth every 6 (six) hours as needed for headache or moderate pain.      Home: Home Living Family/patient expects to be discharged to:: Private residence Living Arrangements: Spouse/significant other Available Help at Discharge: Family, Available PRN/intermittently Type of Home: House Home Access: Stairs to enter Technical brewer of Steps: 3 Home Layout: One level Bathroom Shower/Tub: Multimedia programmer: Programmer, systems: Yes  Lives With: Spouse  Functional History: Prior Function Level of Independence: Independent Comments: drove; managed finances; medicaiton Functional Status:  Mobility: Bed Mobility Overal bed mobility: Needs Assistance Bed Mobility: Sit to Supine Rolling: Min guard Sidelying to sit: Min assist Supine to sit: Min assist(tendency for posterior lean and/or right lateral lean) Sit to supine: Supervision, +2 for safety/equipment Sit to sidelying: Mod assist, Max assist, +2 for physical assistance, +2 for safety/equipment General bed mobility comments: in chair on arrival Transfers Overall transfer level: Needs assistance Equipment used: 1 person hand held assist Transfers: Sit to/from Stand Sit to Stand: Min guard Stand pivot transfers: Max assist, +2 physical assistance General transfer  comment: cues for hand placement, anterior translation and increased time with 2 trials from chair without physical assist Ambulation/Gait Ambulation/Gait assistance: Mod assist, Min assist Gait Distance (Feet): 200 Feet Assistive device: Rolling walker (2 wheeled), None Gait Pattern/deviations: Step-through pattern, Decreased stride length, Trunk flexed General Gait Details: retropulsive and moderate list to the Left with mod assist to control trunk and assist with anterior translation for grossly 60' transitioned to RW with progression to min assist with tactile cues at scapula for anterior weight shift and translation Gait velocity: slower Gait velocity interpretation: <1.8 ft/sec, indicate of risk for recurrent falls  ADL: ADL Overall ADL's : Needs assistance/impaired Eating/Feeding: Maximal assistance Grooming: Wash/dry face, Oral care, Set up, Supervision/safety, Standing Grooming Details (indicate cue type and reason): min-mod A for standing balance with tendency to lean posteriorly and/or to right, able to self correct 75% of time without cues, squenced brushing teeth without issues Upper Body Bathing: Maximal assistance, Sitting Lower Body Bathing: Total assistance Upper Body Dressing : Total assistance, Bed level Lower Body Dressing: Maximal assistance, Sitting/lateral leans Lower Body Dressing Details (indicate cue type and reason): able to follow directions to doff/don right sock then left Toilet Transfer: Minimal assistance, Ambulation Toilet Transfer Details (indicate cue type and reason): 3 feet from bed to recliner Toileting- Clothing Manipulation and Hygiene: +2 for safety/equipment, Minimal assistance, Sit to/from stand Toileting - Clothing Manipulation Details (indicate cue type and reason): Total A back peri care Functional mobility during ADLs: Minimal assistance, +2 for safety/equipment, Rolling walker General ADL Comments: slow processing and assist with walker  mgmt/safety  Cognition: Cognition Overall Cognitive Status: Impaired/Different from baseline Arousal/Alertness: Lethargic Orientation Level: Oriented to person, Disoriented to situation, Oriented to place, Disoriented to time Attention: Sustained Sustained Attention: Impaired Sustained Attention Impairment: Functional basic Memory: Impaired Memory Impairment: Storage deficit Awareness: Impaired Cognition Arousal/Alertness: Awake/alert Behavior During Therapy: Flat affect Overall Cognitive Status: Impaired/Different from baseline Area of Impairment: Following commands, Memory, Safety/judgement, Problem solving Orientation Level: (improved, stated 2012, PT said 75yrs ago, pt said 2019) Current Attention Level: Sustained Memory: (increased time to recall some personal information) Following Commands: Follows one step commands with increased time Safety/Judgement: Decreased awareness of safety, Decreased awareness of deficits Awareness: Intellectual Problem Solving: Requires verbal cues, Slow processing, Requires tactile cues General Comments: VCs for safe hand placement for sit<>stand. Talks when spoken too, but no intiation of conversation noted during session with myself or husband  Blood pressure (!) 129/57, pulse 75, temperature 98.5 F (36.9 C), temperature source Oral, resp. rate 19, height 5\' 2"  (1.575 m), weight 70.8 kg, SpO2 94 %. Physical Exam  HENT:  Ventriculostomy site is clean and dry  Eyes:  Pupils reactive to light  Neck: Normal range of motion. Neck supple. No thyromegaly present.  Cardiovascular: Normal rate and regular rhythm.  Respiratory: Effort normal and breath sounds normal. No respiratory distress.  GI: Soft. Bowel sounds are normal. She exhibits no distension.  Neurological: She is alert.  Patient provides her name and place with minimal delay.  Needed minimal cues for age.  She was able to provide the name of hospital.  Follows simple commands  Not  oriented to day date month or year.  States that she is at [facility] long hospital Motor strength is 5/5 bilateral deltoid bicep tricep grip hip flexor knee extensor ankle dorsiflexor Cerebellar no signs of dysmetria on finger-nose-finger testing or heel-to-shin testing Speech with increased latency response but no evidence of dysarthria or aphasia.  She has intact naming. Scalp stable from ventriculostomy site, no drainage or erythema. GU has Foley  LabResultsLast24Hours  No results found for this or any previous visit (from the past 24 hour(s)).   ImagingResults(Last48hours)  No results found.     Assessment/Plan: Diagnosis: Subarachnoid hemorrhage from aneurysmal bleed, status post basilar tip aneurysm coiling with decreased balance and cognitive deficits 1. Does the need for close, 24 hr/day medical supervision in concert with the patient's rehab needs make it unreasonable for this patient to be served in a less intensive setting? Yes 2. Co-Morbidities requiring supervision/potential complications: Dysphagia for pills, urinary retention 3. Due to bladder management, bowel management, safety, skin/wound care, disease management, medication administration, pain management and patient education, does the patient require 24 hr/day rehab nursing? Yes 4. Does the patient require coordinated care of a physician, rehab nurse, PT (1-2 hrs/day, 5 days/week), OT (1-2 hrs/day, 5 days/week) and SLP (.5-1 hrs/day, 5 days/week) to address physical and functional deficits in the context of the above medical diagnosis(es)? Yes Addressing deficits in the following areas: balance, endurance, locomotion, strength, transferring, bowel/bladder control, bathing, dressing, feeding, grooming, toileting, cognition and psychosocial support 5. Can the patient actively participate in an intensive therapy program of at least 3 hrs of therapy per day at least 5 days per week? Yes 6. The potential for patient to  make measurable gains while on inpatient rehab is excellent 7. Anticipated functional outcomes upon discharge from inpatient rehab are supervision  with PT, supervision with OT, supervision with SLP. 8. Estimated rehab length of stay to reach the above functional goals is:  14 to 20 days 9. Anticipated D/C setting: Home 10. Anticipated post D/C treatments: [facility] therapy 11. Overall Rehab/Functional Prognosis: excellent  RECOMMENDATIONS: This patient's condition is appropriate for continued rehabilitative care in the following setting: CIR Patient has agreed to participate in recommended program. Yes Note that insurance prior authorization may be required for reimbursement for recommended care.  Comment:  "I have personally performed a face to face diagnostic evaluation of this patient.  Additionally, I have reviewed and concur with the physician assistant's documentation above." Sheryl James M.D. [facility] FAAPM&R (Sports Med, Neuromuscular Med) Diplomate Am Board of Electrodiagnostic Med  Sheryl James 12/24/2017        Revision History                        Routing History

## 2017-12-26 NOTE — Progress Notes (Signed)
Patient and family arrived on rehab unit today and informed about patient safety plan and rehab booklet.

## 2017-12-26 NOTE — Progress Notes (Signed)
RN called to bedside by PT as patient had one episode of emesis while working with PT this morning. Pt would not respond nor answer questions for therapist. RN paged MD and gave IV zofran as well as morning dose of keppra. Patient responded to RN questions and is A/O x3. VSS. MD made aware and new orders received at this time. RN will continue to monitor and assess closely.

## 2017-12-26 NOTE — Progress Notes (Signed)
Physical Therapy Treatment Patient Details Name: Sheryl James MRN: [id number] DOB: 18-Apr-1955 Today's Date: 12/26/2017    History of Present Illness 62 y.o. female with recent HAs dx with 10/7 6 x 73mm basilar apex aneurysm, s/p coiling 10/3, IVC. Pt with syncopal event during therapy on 12/12/17 with possible seizure. Pt pulled out [place] accidentally 10/28. With P.T. pt with vomiting and staring 11/1. PMHx: HTN    PT Comments    Pt in bed on arrival and willing to participate in session. Pt with assist to achieve bed mobility and performed 10 standing heel raises prior to gait to initiate anterior translation but this did not appear to assist in limiting retropulsion with gait. Pt walked with increased distance and less assist than prior session. After walk at chair had pt stand with bil UE on P.T. Arms to work on anterior translation and balance but pt with increased left retropulsion and staring episode with lack of response, pt max assist to chair with noted incontinent BM during this time. Pt then became alert and responsive, stood again to address linen change and pericare with having similar episode with staring, pt assisted back to sitting and pt vomited. Pt cleaned and positioned in chair with pt able to state she could feel she would vomit but unable to state. Pt alert and responsive end of session with RN and spouse present and aware.   BP pre session 155/98 and 105/62 end of session HR 82-90    Follow Up Recommendations  CIR;Supervision/Assistance - 24 hour     Equipment Recommendations  Rolling walker with 5" wheels    Recommendations for Other Services       Precautions / Restrictions Precautions Precautions: Fall Restrictions Weight Bearing Restrictions: No    Mobility  Bed Mobility Overal bed mobility: Needs Assistance Bed Mobility: Supine to Sit     Supine to sit: Min assist     General bed mobility comments: min assist with multimodal cues to elevate trunk from  surface and balance at EOB  Transfers Overall transfer level: Needs assistance   Transfers: Sit to/from Stand Sit to Stand: Min assist         General transfer comment: min assist to stand from bed, minguard to stand from chair x 3 trials with min-max assist to control descent to surface due to 2 periods of staring episode with lack of response or assist and lowered to chair  Ambulation/Gait Ambulation/Gait assistance: Min assist;+2 safety/equipment Gait Distance (Feet): 225 Feet Assistive device: Rolling walker (2 wheeled) Gait Pattern/deviations: Step-through pattern;Decreased stride length;Narrow base of support   Gait velocity interpretation: <1.8 ft/sec, indicate of risk for recurrent falls General Gait Details: pt maintains left posterior bias during gait and able to maintain anterior translation with min assist with cueing at scapula. Periods of grossly 5 sec pt able to maintain gait with assist to advance RW but then drifts back into retropulsion without tactile cues. Max cues for looking up, direction and safety with chair to follow for safety.    Stairs             Wheelchair Mobility    Modified Rankin (Stroke Patients Only) Modified Rankin (Stroke Patients Only) Pre-Morbid Rankin Score: No symptoms Modified Rankin: Moderately severe disability     Balance Overall balance assessment: Needs assistance Sitting-balance support: Feet supported;Bilateral upper extremity supported Sitting balance-Leahy Scale: Fair Sitting balance - Comments: EOB with posterior lean with verbal cues to translate posture anteriorly and keep feet on the floor  Postural control: Posterior lean   Standing balance-Leahy Scale: Poor Standing balance comment: external cues for anterior translation with min assist for prolonged standing. 2 periods of staring today with need for max assist as this occurred                            Cognition Arousal/Alertness:  Awake/alert Behavior During Therapy: Flat affect Overall Cognitive Status: Impaired/Different from baseline Area of Impairment: Orientation;Attention;Memory;Following commands;Safety/judgement;Awareness;Problem solving                 Orientation Level: Time Current Attention Level: Sustained Memory: Decreased recall of precautions;Decreased short-term memory Following Commands: Follows one step commands with increased time Safety/Judgement: Decreased awareness of safety;Decreased awareness of deficits   Problem Solving: Slow processing;Decreased initiation General Comments: Requires multimodal cues to complete task and extra time to process.  End of session pt with 2 episodes of staring with increased posterior lean and lack of response to therapist grossly 10 sec each      Exercises General Exercises - Lower Extremity Heel Raises: AROM;10 reps;Standing;Both    General Comments        Pertinent Vitals/Pain Pain Assessment: No/denies pain    Home Living                      Prior Function            PT Goals (current goals can now be found in the care plan section) Progress towards PT goals: Progressing toward goals    Frequency           PT Plan Current plan remains appropriate    Co-evaluation              AM-PAC PT "6 Clicks" Daily Activity  Outcome Measure  Difficulty turning over in bed (including adjusting bedclothes, sheets and blankets)?: Unable Difficulty moving from lying on back to sitting on the side of the bed? : Unable Difficulty sitting down on and standing up from a chair with arms (e.g., wheelchair, bedside commode, etc,.)?: Unable Help needed moving to and from a bed to chair (including a wheelchair)?: A Little Help needed walking in hospital room?: A Lot Help needed climbing 3-5 steps with a railing? : Total 6 Click Score: 9    End of Session Equipment Utilized During Treatment: Gait belt Activity Tolerance: Patient  tolerated treatment well Patient left: in chair;with call bell/phone within reach;with chair alarm set;with family/visitor present;with nursing/sitter in room Nurse Communication: Mobility status;Other (comment)(emesis and staring during session) PT Visit Diagnosis: Difficulty in walking, not elsewhere classified (R26.2);Other symptoms and signs involving the nervous system (R29.898)     Time: 0931-1010 PT Time Calculation (min) (ACUTE ONLY): 39 min  Charges:  $Gait Training: 8-22 mins $Therapeutic Activity: 8-22 mins $Neuromuscular Re-education: 8-22 mins                     [place], PT Acute Rehabilitation Services Pager: [phone] Office: [place] 12/26/2017, 10:39 AM

## 2017-12-26 NOTE — H&P (Signed)
Physical Medicine and Rehabilitation Admission H&P        Chief Complaint  Patient presents with  . Seizures  : HPI: Sheryl James is a 62 year old right-handed female history of tobacco abuse on no prescription medications.  Per chart review patient lives with spouse.  Independent driving.  One level home with 3 steps to entry.  Presented 11/26/2017 with persistent headaches over the previous past few weeks with associated nausea, no vomiting.  She had initially been seen at [facility] a few weeks ago prior and work-up negative and she was discharged home.  During work-up noted hypertensive advised to follow-up with her PCP.  She went to [facility] due to persistent headaches dizziness she was placed on antibiotic treatment for possible otitis media.  On day of admission 11/26/2017 patient became unresponsive while eating breakfast with jerking movements lasting 20 to 30 seconds with apparent seizure.  Cranial CT scan showed subarachnoid hemorrhage tracking in the midline of each frontal lobe as well as along the middle cerebral artery distribution tracks bilaterally.  Mild hemorrhage layering in each lateral ventricle.  Prior small infarcts in the head of the caudate nucleus on the right and the left thalamus.  There was also appearance concerning for possible aneurysm rupture at the level of the anterior communicating artery.  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  Patient underwent right frontal ventricular catheter placement 11/27/2017 per Dr. Christella Noa.  Diagnostic cerebral angiogram 11/27/2017 per Dr. 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  No significant vasospasm seen.  Follow-up cranial CT scan showed continued enlargement ventricles and underwent ventriculostomy drain in the right lateral ventricle 12/06/2017.  Urine study grade 100,000 Pseudomonas maintained  on Cipro.  Patient with persistent bouts of diarrhea felt to be initially related to antibiotic therapy with followed by gastroenterology services.  C. difficile negative and bowel program regulated with improvement and no further bouts of recurrent diarrhea.  Her diet has been advanced to regular.  She remains on Keppra for seizure prophylaxis.  Intermittent bouts of urinary retention maintained on Urecholine as well as Flomax.  Therapy evaluations completed with recommendations of physical medicine rehab consult.  Patient was admitted for a comprehensive rehab program.   Review of Systems  Constitutional: Negative for chills and fever.  HENT: Negative for hearing loss.   Eyes: Negative for blurred vision and double vision.  Respiratory: Negative for shortness of breath.   Cardiovascular: Negative for chest pain and palpitations.  Gastrointestinal: Positive for diarrhea and nausea.  Genitourinary: Negative for dysuria, flank pain and hematuria.  Musculoskeletal: Positive for joint pain and myalgias.  Skin: Negative for rash.  Neurological: Positive for dizziness, seizures and headaches.  All other systems reviewed and are negative.   History reviewed. No pertinent past medical history.      Past Surgical History:  Procedure Laterality Date  . IR ANGIO INTRA EXTRACRAN SEL INTERNAL CAROTID BILAT MOD SED   11/27/2017  . IR ANGIO VERTEBRAL SEL VERTEBRAL UNI L MOD SED   11/27/2017  . IR ANGIOGRAM FOLLOW UP STUDY   11/27/2017  . IR ANGIOGRAM FOLLOW UP STUDY   11/27/2017  . IR ANGIOGRAM FOLLOW UP STUDY   11/27/2017  . IR ANGIOGRAM FOLLOW UP STUDY   11/27/2017  . IR TRANSCATH/EMBOLIZ   11/27/2017  . IR US GUIDE VASC ACCESS RIGHT  11/27/2017  . RADIOLOGY WITH ANESTHESIA N/A 11/27/2017    Procedure: IR WITH ANESTHESIA;  Surgeon: Consuella Lose, MD;  Location: [facility];  Service: Radiology;  Laterality: N/A;    History reviewed. No pertinent family history. Social History:  reports that she has been  smoking cigarettes. She has been smoking about 0.50 packs per day. She has never used smokeless tobacco. She reports that she does not drink alcohol or use drugs. Allergies: No Known Allergies Medications Prior to Admission  Medication Sig Dispense Refill  . ibuprofen (ADVIL,MOTRIN) 200 MG tablet Take 600 mg by mouth every 6 (six) hours as needed for headache or moderate pain.          Drug Regimen Review Drug regimen was reviewed and remains appropriate with no significant issues identified   Home: Home Living Family/patient expects to be discharged to:: Private residence Living Arrangements: Spouse/significant other Available Help at Discharge: Family, Available PRN/intermittently Type of Home: House Home Access: Stairs to enter Technical brewer of Steps: 3 Home Layout: One level Bathroom Shower/Tub: Multimedia programmer: Programmer, systems: Yes  Lives With: Spouse   Functional History: Prior Function Level of Independence: Independent Comments: drove; managed finances; medicaiton   Functional Status:  Mobility: Bed Mobility Overal bed mobility: Needs Assistance Bed Mobility: Sit to Supine Rolling: Min guard Sidelying to sit: Min assist Supine to sit: Min assist(tendency for posterior lean and/or right lateral lean) Sit to supine: Supervision, +2 for safety/equipment Sit to sidelying: Mod assist, Max assist, +2 for physical assistance, +2 for safety/equipment General bed mobility comments: in chair on arrival Transfers Overall transfer level: Needs assistance Equipment used: 1 person hand held assist Transfers: Sit to/from Stand Sit to Stand: Min guard Stand pivot transfers: Max assist, +2 physical assistance General transfer comment: cues for hand placement, anterior translation and increased time with 2 trials from chair without physical assist Ambulation/Gait Ambulation/Gait assistance: Mod assist, Min assist Gait Distance (Feet): 200  Feet Assistive device: Rolling walker (2 wheeled), None Gait Pattern/deviations: Step-through pattern, Decreased stride length, Trunk flexed General Gait Details: retropulsive and moderate list to the Left with mod assist to control trunk and assist with anterior translation for grossly 60' transitioned to RW with progression to min assist with tactile cues at scapula for anterior weight shift and translation Gait velocity: slower Gait velocity interpretation: <1.8 ft/sec, indicate of risk for recurrent falls   ADL: ADL Overall ADL's : Needs assistance/impaired Eating/Feeding: Maximal assistance Grooming: Wash/dry face, Oral care, Set up, Supervision/safety, Standing Grooming Details (indicate cue type and reason): min-mod A for standing balance with tendency to lean posteriorly and/or to right, able to self correct 75% of time without cues, squenced brushing teeth without issues Upper Body Bathing: Maximal assistance, Sitting Lower Body Bathing: Total assistance Upper Body Dressing : Total assistance, Bed level Lower Body Dressing: Maximal assistance, Sitting/lateral leans Lower Body Dressing Details (indicate cue type and reason): able to follow directions to doff/don right sock then left Toilet Transfer: Minimal assistance, Ambulation Toilet Transfer Details (indicate cue type and reason): 3 feet from bed to recliner Toileting- Clothing Manipulation and Hygiene: +2 for safety/equipment, Minimal assistance, Sit to/from stand Toileting - Clothing Manipulation Details (indicate cue type and reason): Total A back peri care Functional mobility during ADLs: Minimal assistance, +2 for safety/equipment, Rolling walker General ADL Comments: slow processing and assist with walker mgmt/safety   Cognition: Cognition Overall Cognitive Status: Impaired/Different from baseline Arousal/Alertness: Lethargic Orientation Level: Oriented to person, Oriented to place, Disoriented to  situation, Disoriented  to time Attention: Sustained Sustained Attention: Impaired Sustained Attention Impairment: Functional basic Memory: Impaired Memory Impairment: Storage deficit Awareness: Impaired Cognition Arousal/Alertness: Awake/alert Behavior During Therapy: Flat affect Overall Cognitive Status: Impaired/Different from baseline Area of Impairment: Following commands, Memory, Safety/judgement, Problem solving Orientation Level: (improved, stated 2012, PT said 6yrs ago, pt said 2019) Current Attention Level: Sustained Memory: (increased time to recall some personal information) Following Commands: Follows one step commands with increased time Safety/Judgement: Decreased awareness of safety, Decreased awareness of deficits Awareness: Intellectual Problem Solving: Requires verbal cues, Slow processing, Requires tactile cues General Comments: VCs for safe hand placement for sit<>stand. Talks when spoken too, but no intiation of conversation noted during session with myself or husband   Physical Exam: Blood pressure (!) 139/124, pulse 80, temperature 98.1 F (36.7 C), temperature source Oral, resp. rate (!) 21, height 5\' 2"  (1.575 m), weight 70.8 kg, SpO2 95 %. Physical Exam  Constitutional: No distress.  HENT:  Scalp shaved. Staples intact in entry sites. No drainage  Eyes: Pupils are equal, round, and reactive to light. EOM are normal.  Neck: Normal range of motion. No tracheal deviation present. No thyromegaly present.  Cardiovascular: Normal rate.  No murmur heard. Respiratory: Effort normal. No respiratory distress. She has no wheezes. She has rales.  GI: Soft. She exhibits no distension. There is no tenderness. There is no rebound.  Musculoskeletal:  Full ROM all 4's  Neurological: She is alert.  Flat, follows simple commands. Delays in processing. Cannot answer biographical information. Moves all 4's 4/5. Sensation intact to LT, pain in all 4's.   Skin: She is not diaphoretic.    Psychiatric:  flat      Lab Results Last 48 Hours  No results found for this or any previous visit (from the past 48 hour(s)).   Imaging Results (Last 48 hours)  No results found.           Medical Problem List and Plan: 1.  Decreased functional mobility with cognitive deficits secondary to bilateral medial frontal/temporal subarachnoid hemorrhage from aneurysmal bleed. Pt is status post basilar tip aneurysm coiling             -admitting to inpatient rehab 2.  DVT Prophylaxis/Anticoagulation: SCDs 3. Pain Management: Ultram as needed 4. Mood: Provide emotional support 5. Neuropsych: This patient is capable of making decisions on her own behalf. 6. Skin/Wound Care: Routine skin checks 7. Fluids/Electrolytes/Nutrition: Routine in and outs with follow-up chemistries 8.  Seizure prophylaxis.  Keppra 500 mg twice daily 9.  Urinary retention.  Urecholine 3 times daily, Flomax 0.4 mg daily.  Check PVR x3 10.  Diarrhea.  Improved.  Continue Imodium as needed as well as Florastor             -encourage PO 11.  Tobacco abuse.  Provide counseling 12.  Pseudomonas UTI.  Completed course of Cipro. 13. Unresponsive episode this AM. ?syncopal vs seizure activity.              -pt at baseline neurologically after event.             -monitor closely             -encourage fluids and po intake     Post Admission Physician Evaluation: 1. Functional deficits secondary  to bi-frontal/temporal SAH. 2. Patient is admitted to receive collaborative, interdisciplinary care between the physiatrist, rehab nursing staff, and therapy team. 3. Patient's level of medical complexity and substantial therapy needs in context  of that medical necessity cannot be provided at a lesser intensity of care such as a SNF. 4. Patient has experienced substantial functional loss from his/her baseline which was documented above under the "Functional History" and "Functional Status" headings.  Judging by the patient's  diagnosis, physical exam, and functional history, the patient has potential for functional progress which will result in measurable gains while on inpatient rehab.  These gains will be of substantial and practical use upon discharge  in facilitating mobility and self-care at the household level. 5. Physiatrist will provide 24 hour management of medical needs as well as oversight of the therapy plan/treatment and provide guidance as appropriate regarding the interaction of the two. 6. The Preadmission Screening has been reviewed and patient status is unchanged unless otherwise stated above. 7. 24 hour rehab nursing will assist with bladder management, bowel management, safety, skin/wound care, disease management, medication administration, pain management and patient education  and help integrate therapy concepts, techniques,education, etc. 8. PT will assess and treat for/with: Lower extremity strength, range of motion, stamina, balance, functional mobility, safety, adaptive techniques and equipment, NMR, cognitive perceptual rx, family ed.   Goals are: supervision. 9. OT will assess and treat for/with: ADL's, functional mobility, safety, upper extremity strength, adaptive techniques and equipment, NMR, cognitive perceptual rx, family ed.   Goals are: supervision. Therapy may proceed with showering this patient. 10. SLP will assess and treat for/with: cognition, communication, family ed.  Goals are: supervision. 11. Case Management and Social Worker will assess and treat for psychological issues and discharge planning. 12. Team conference will be held weekly to assess progress toward goals and to determine barriers to discharge. 13. Patient will receive at least 3 hours of therapy per day at least 5 days per week. 14. ELOS: 14-20 days       15. Prognosis:  excellent     I have personally performed a face to face diagnostic evaluation of this patient and formulated the key components of the plan.   Additionally, I have personally reviewed laboratory data, imaging studies, as well as relevant notes and concur with the physician assistant's documentation above.   Meredith Staggers, MD, [facility] Drown     Lavon Paganini [place], PA-C 12/24/2017  The patient's status has not changed. The original post admission physician evaluation remains appropriate, and any changes from the pre-admission screening or documentation from the acute chart are noted above.  Meredith Staggers, MD 12/26/2017

## 2017-12-27 ENCOUNTER — Inpatient Hospital Stay (HOSPITAL_COMMUNITY): Payer: 59 | Admitting: Physical Therapy

## 2017-12-27 ENCOUNTER — Inpatient Hospital Stay (HOSPITAL_COMMUNITY): Payer: 59 | Admitting: Occupational Therapy

## 2017-12-27 ENCOUNTER — Inpatient Hospital Stay (HOSPITAL_COMMUNITY): Payer: 59

## 2017-12-27 DIAGNOSIS — R339 Retention of urine, unspecified: Secondary | ICD-10-CM

## 2017-12-27 DIAGNOSIS — I609 Nontraumatic subarachnoid hemorrhage, unspecified: Secondary | ICD-10-CM

## 2017-12-27 DIAGNOSIS — N390 Urinary tract infection, site not specified: Secondary | ICD-10-CM

## 2017-12-27 NOTE — Plan of Care (Signed)
  Problem: RH BLADDER ELIMINATION Goal: RH STG MANAGE BLADDER WITH ASSISTANCE Description STG Manage Bladder With Assistance Outcome: Not Progressing; patient has foley cath   Problem: RH SKIN INTEGRITY Goal: RH STG SKIN FREE OF INFECTION/BREAKDOWN Outcome: Not Progressing; MASD on bottom

## 2017-12-27 NOTE — Evaluation (Signed)
Physical Therapy Assessment and Plan  Patient Details  Name: Sheryl James MRN: [id number] Date of Birth: 07-24-1955  PT Diagnosis: Abnormal posture, Abnormality of gait, Cognitive deficits, Impaired cognition and Impaired sensation Rehab Potential: Good ELOS: 14-16   Today's Date: 12/27/2017 PT Individual Time: 1000-1100 PT Individual Time Calculation (min): 60 min    Problem List:  Patient Active Problem List   Diagnosis Date Noted  . SAH (subarachnoid hemorrhage) ([place]) 12/26/2017  . Subarachnoid hemorrhage due to ruptured aneurysm ([place]) 11/26/2017    Past Medical History: History reviewed. No pertinent past medical history. Past Surgical History:  Past Surgical History:  Procedure Laterality Date  . IR ANGIO INTRA EXTRACRAN SEL INTERNAL CAROTID BILAT MOD SED  11/27/2017  . IR ANGIO VERTEBRAL SEL VERTEBRAL UNI L MOD SED  11/27/2017  . IR ANGIOGRAM FOLLOW UP STUDY  11/27/2017  . IR ANGIOGRAM FOLLOW UP STUDY  11/27/2017  . IR ANGIOGRAM FOLLOW UP STUDY  11/27/2017  . IR ANGIOGRAM FOLLOW UP STUDY  11/27/2017  . IR TRANSCATH/EMBOLIZ  11/27/2017  . IR US GUIDE VASC ACCESS RIGHT  11/27/2017  . RADIOLOGY WITH ANESTHESIA N/A 11/27/2017   Procedure: IR WITH ANESTHESIA;  Surgeon: Consuella Lose, MD;  Location: [facility];  Service: Radiology;  Laterality: N/A;    Assessment & Plan Clinical Impression: Patient is a 62 y.o. year old female with recent admission to the hospital on 11/26/17 with subarachnoid hemorrhage.  Patient transferred to CIR on 12/26/2017 .   Patient currently requires mod with mobility secondary to muscle weakness, motor apraxia, ataxia and decreased coordination and decreased initiation, decreased attention, decreased awareness, decreased problem solving, decreased safety awareness, decreased memory and delayed processing.  Prior to hospitalization, patient was independent  with mobility and lived with Spouse in a 1 level  home.  Home access is 3(will install handrail on L side  prior to pt coming home)Stairs to enter.  Patient will benefit from skilled PT intervention to maximize safe functional mobility, minimize fall risk and decrease caregiver burden for planned discharge home with 24 hour supervision.  Anticipate patient will benefit from follow up Mariano Colon at discharge.   PT - End of Session Activity Tolerance: Tolerates 10 - 20 min activity with multiple rests Endurance Deficit: Yes PT Assessment Rehab Potential (ACUTE/IP ONLY): Good PT Barriers to Discharge: [place] home environment;Medical stability;Home environment access/layout;Incontinence;Behavior PT Barriers to Discharge Comments: cognitive deficits present PT Patient demonstrates impairments in the following area(s): Balance;Behavior;Edema;Endurance;Motor;Nutrition;Safety;Sensory;Skin Integrity;Perception PT Transfers Functional Problem(s): Bed Mobility;Bed to Chair;Car;Furniture;Floor PT Locomotion Functional Problem(s): Ambulation;Wheelchair Mobility;Stairs PT Plan PT Intensity: Minimum of 1-2 x/day ,45 to 90 minutes PT Frequency: 5 out of 7 days PT Duration Estimated Length of Stay: 14-16 PT Treatment/Interventions: Ambulation/gait training;Community reintegration;DME/adaptive equipment instruction;Neuromuscular re-education;Psychosocial support;Stair training;UE/LE Strength taining/ROM;Wheelchair propulsion/positioning;Balance/vestibular training;Discharge planning;Skin care/wound management;Therapeutic Activities;UE/LE Coordination activities;Cognitive remediation/compensation;Disease management/prevention;Functional mobility training;Patient/family education;Splinting/orthotics;Therapeutic Exercise;Visual/perceptual remediation/compensation;Pain management PT Transfers Anticipated Outcome(s): supervision with LRAD PT Locomotion Anticipated Outcome(s): ambulatory at supervision level with LRAD PT Recommendation Recommendations for Other Services: Therapeutic Recreation consult Therapeutic  Recreation Interventions: Kitchen group;Stress management Follow Up Recommendations: Home health PT Patient destination: Home Equipment Recommended: Rolling walker with 5" wheels;Wheelchair (measurements);Wheelchair cushion (measurements);To be determined  Skilled Therapeutic Intervention Pt received in supine and agreeable to PT. Began PT evaluation and initiated treatment(see below for details). Pt educated on PT rehab process, estimated length of stay, and safety rules/precautions.   PT facilitated anterior movement of trunk and prevented posterior lean during stair training exercise and sit to stand. PT provided  verbal and tactile cues during transfers to improve safety of activity and reduce risk for falls.    Ended session with pt sitting in WC, seatbelt in place and alarm on, call bell within reach, and all needs met.  PT Evaluation Precautions/Restrictions   fall  General   Vital SignsTherapy Vitals Temp: (!) 97.5 F (36.4 C) Temp Source: Oral Pulse Rate: 87 Resp: 19 BP: 130/72 Patient Position (if appropriate): Sitting Oxygen Therapy SpO2: 97 % O2 Device: Room Air Pain   0/10  Home Living/Prior Functioning Home Living Available Help at Discharge: Family;Available PRN/intermittently Type of Home: House Home Access: Stairs to enter [employer] of Steps: 3(will install handrail on L side prior to pt coming home) Home Layout: One level Bathroom Shower/Tub: Estate agent Accessibility: Yes Additional Comments: no DME for bathroom currently; likely not accessible via [place]  Lives With: Spouse Prior Function Level of Independence: (Independent without AD as PLOF)  Able to Take Stairs?: Yes Driving: Yes 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; managed finances; Publishing rights manager Vision/Perception  Vision - Assessment Additional Comments: inconsistent R and L  inattention  Perception Perception: Impaired Praxis Praxis: Impaired  Cognition Overall Cognitive Status: Impaired/Different from baseline Arousal/Alertness: Awake/alert Orientation Level: Oriented to person;Oriented to place;Oriented to situation;Disoriented to time Attention: Sustained Sustained Attention: Impaired(WFL on formal test, however functionally impaired for extended time) Memory: Impaired Awareness: Impaired Awareness Impairment: Intellectual impairment Problem Solving: Impaired Problem Solving Impairment: Functional basic;Verbal basic Behaviors: (Flat) Safety/Judgment: Impaired Sensation Sensation Light Touch: Impaired Detail Light Touch Impaired Details: Impaired RUE;Impaired LUE(gross sensation impaired BLE R>L) Additional Comments: impaired R/L distinction Coordination Gross Motor Movements are Fluid and Coordinated: No Coordination and Movement Description: mild apraxia and initation deficits Heel Shin Test: WFL BLE Motor  Motor Motor: Motor apraxia Motor - Skilled Clinical Observations: mild apraxia and initiation deficits   Mobility Bed Mobility Bed Mobility: Supine to Sit;Sitting - Scoot to Edge of Bed Supine to Sit: Moderate Assistance - Patient 50-74%(HOB elevated) Sitting - Scoot to Edge of Bed: Moderate Assistance - Patient 50-74% Transfers Transfers: Sit to [employer] Transfers Sit to Stand: Moderate Assistance - Patient 50-74% Stand Pivot Transfers: Moderate Assistance - Patient 50 - 74% Stand Pivot Transfer Details: Tactile cues for posture;Tactile cues for placement;Verbal cues for technique;Manual facilitation for weight shifting;Verbal cues for safe use of DME/AE Transfer (Assistive device): Rolling walker Locomotion  Gait Ambulation: Yes Gait Assistance: Minimal Assistance - Patient > 75% Gait Distance (Feet): 60 Feet Assistive device: Rolling walker Gait Gait: Yes Gait Pattern: Impaired Gait Pattern: Decreased step length -  right;Decreased step length - left;Trunk flexed;Narrow base of support;Decreased trunk rotation;Right flexed knee in stance;Left flexed knee in stance Stairs / Additional Locomotion Stairs: Yes Stairs Assistance: Minimal Assistance - Patient > 75% Stair Management Technique: Two rails Number of Stairs: 4 Height of Stairs: 3 Wheelchair Mobility Wheelchair Mobility: Yes Wheelchair Assistance: Minimal assistance - Patient >75%(Pt needs cuing for hand placement/propulsion and direction of WC to prevent veer to the R) Environmental health practitioner: Both upper extremities Wheelchair Parts Management: Needs assistance Distance: 75  Trunk/Postural Assessment  Cervical Assessment Cervical Assessment: Exceptions to WFL(forward head posture) Thoracic Assessment Thoracic Assessment: Exceptions to WFL(kyphotic) Lumbar Assessment Lumbar Assessment: Exceptions to WFL(posterior pelvic tilt) Postural Control Postural Control: Deficits on evaluation(trunk control impaired when sitting on EOB with no UE support. Posture improved with BUE support from RW) Righting Reactions: delayed posteriorly Protective Responses: Delayed  Posteriorly   Balance Balance Balance Assessed: Yes Static Sitting Balance Static Sitting - Balance Support: No upper extremity supported Static Sitting - Level of Assistance: 3: Mod assist Dynamic Sitting Balance Sitting balance - Comments: EOB with posterior lean with verbal cues to translate posture anteriorly and keep feet on the floor Static Standing Balance Static Standing - Balance Support: No upper extremity supported Static Standing - Level of Assistance: 3: Mod assist Dynamic Standing Balance Dynamic Standing - Balance Support: Right upper extremity supported;Left upper extremity supported;During functional activity Dynamic Standing - Level of Assistance: 3: Mod assist Extremity Assessment      RLE Assessment RLE Assessment: Exceptions to [facility] Active Range of Motion (AROM)  Comments: WFL RLE Strength RLE Overall Strength Comments: grossly 4-/5 cognition limiting accuracy LLE Assessment Active Range of Motion (AROM) Comments: WFL General Strength Comments: grossly 4/5; cognition limiting accuracy    Refer to Care Plan for Long Term Goals  Recommendations for other services: None   Discharge Criteria: Patient will be discharged from PT if patient refuses treatment 3 consecutive times without medical reason, if treatment goals not met, if there is a change in medical status, if patient makes no progress towards goals or if patient is discharged from hospital.  The above assessment, treatment plan, treatment alternatives and goals were discussed and mutually agreed upon: by patient and by family  Amador Cunas 12/27/2017, 3:43 PM

## 2017-12-27 NOTE — Evaluation (Addendum)
Occupational Therapy Assessment and Plan  Patient Details  Name: Sheryl James MRN: [id number] Date of Birth: Jul 01, 1955  OT Diagnosis: abnormal posture, apraxia, cognitive deficits and muscle weakness (generalized) Rehab Potential: Rehab Potential (ACUTE ONLY): Good ELOS: 11-13 days   Today's Date: 12/27/2017 OT Individual Time: 2595-6387 OT Individual Time Calculation (min): 78 min     Problem List:  Patient Active Problem List   Diagnosis Date Noted  . SAH (subarachnoid hemorrhage) ([place]) 12/26/2017  . Subarachnoid hemorrhage due to ruptured aneurysm ([place]) 11/26/2017    Past Medical History: History reviewed. No pertinent past medical history. Past Surgical History:  Past Surgical History:  Procedure Laterality Date  . IR ANGIO INTRA EXTRACRAN SEL INTERNAL CAROTID BILAT MOD SED  11/27/2017  . IR ANGIO VERTEBRAL SEL VERTEBRAL UNI L MOD SED  11/27/2017  . IR ANGIOGRAM FOLLOW UP STUDY  11/27/2017  . IR ANGIOGRAM FOLLOW UP STUDY  11/27/2017  . IR ANGIOGRAM FOLLOW UP STUDY  11/27/2017  . IR ANGIOGRAM FOLLOW UP STUDY  11/27/2017  . IR TRANSCATH/EMBOLIZ  11/27/2017  . IR US GUIDE VASC ACCESS RIGHT  11/27/2017  . RADIOLOGY WITH ANESTHESIA N/A 11/27/2017   Procedure: IR WITH ANESTHESIA;  Surgeon: Consuella Lose, MD;  Location: [place];  Service: Radiology;  Laterality: N/A;    Assessment & Plan Clinical Impression: Sheryl James is a 62 year old right-handed female history of tobacco abuse on no prescription medications.Per chart review patient lives with spouse. Independent driving. One level home with 3 steps to entry.Presented 11/26/2017 with persistent headaches over the previous past few weeks with associated nausea, no vomiting. She had initially been seen at [facility] a few weeks ago prior and work-up negative and she was discharged home. During work-up noted hypertensive advised to follow-up with her PCP. She went to [place] walk-in clinic due to persistent headaches dizziness  she was placed on antibiotic treatment for possible otitis media. On day of admission 11/26/2017 patient became unresponsive while eating breakfast with jerking movements lasting 20 to 30 seconds with apparent seizure. Cranial CT scan showed subarachnoid hemorrhage tracking in the midline of each frontal lobe as well as along the middle cerebral artery distribution tracks bilaterally. Mild hemorrhage layering in each lateral ventricle. Prior small infarcts in the head of the caudate nucleus on the right and the left thalamus. There was also appearance concerning for possible aneurysm rupture at the level of the anterior communicating artery. 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 Patient underwent right frontal ventricular catheter placement 11/27/2017 per Dr. Christella Noa. Diagnostic cerebral angiogram 11/27/2017 per Dr. 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 No significant vasospasm seen. Follow-up cranial CT scan showed continued enlargement ventricles and underwent ventriculostomy drain in the right lateral ventricle 12/06/2017. Urine study grade 100,000 Pseudomonas maintained on Cipro. Patient with persistent bouts of diarrhea felt to be initially related to antibiotic therapy with followed by gastroenterology services. C. difficile negative and bowel program regulated with improvement and no further bouts of recurrent diarrhea. Her diet has been advanced to regular. She remains on Keppra for seizure prophylaxis. Intermittent bouts of urinary retention maintained on Urecholine as well as Flomax. Therapy evaluations completed with recommendations of physical medicine rehab consult. Patient was admitted for a comprehensive rehab program.  Patient currently requires min with basic self-care skills secondary to muscle weakness, decreased cardiorespiratoy endurance, motor apraxia  and decreased  coordination, decreased initiation, decreased attention, decreased awareness, decreased problem solving, decreased safety awareness, decreased memory and delayed processing and decreased sitting balance, decreased standing balance, decreased postural control and decreased balance strategies.  Prior to hospitalization, patient could complete BADLs with independent .  Patient will benefit from skilled intervention to increase independence with basic self-care skills prior to discharge home with spouse.  Anticipate patient will require 24 hour supervision and follow up home health.  OT - End of Session Endurance Deficit: Yes OT Assessment Rehab Potential (ACUTE ONLY): Good OT Barriers to Discharge: Decreased caregiver support;Medical stability;Incontinence OT Patient demonstrates impairments in the following area(s): Balance;Skin Integrity;Cognition;Endurance;Motor;Perception;Safety;Sensory OT Basic ADL's Functional Problem(s): Eating;Grooming;Bathing;Dressing;Toileting OT Advanced ADL's Functional Problem(s): Simple Meal Preparation OT Transfers Functional Problem(s): Toilet;Tub/Shower OT Additional Impairment(s): None OT Plan OT Intensity: Minimum of 1-2 x/day, 45 to 90 minutes OT Frequency: 5 out of 7 days OT Duration/Estimated Length of Stay: 11-13 days OT Treatment/Interventions: Balance/vestibular training;Disease mangement/prevention;Neuromuscular re-education;Self Care/advanced ADL retraining;Therapeutic Exercise;Wheelchair propulsion/positioning;UE/LE Strength taining/ROM;Skin care/wound managment;Pain management;DME/adaptive equipment instruction;Cognitive remediation/compensation;Community reintegration;Patient/family education;UE/LE Coordination activities;Visual/perceptual remediation/compensation;Therapeutic Activities;Psychosocial support;Functional mobility training;Discharge planning OT Self Feeding Anticipated Outcome(s): Supervision/cuing  OT Basic Self-Care  Anticipated Outcome(s): Supervision/cuing OT Toileting Anticipated Outcome(s): Supervision/cuing  OT Bathroom Transfers Anticipated Outcome(s): Supervision/cuing  OT Recommendation Recommendations for Other Services: Therapeutic Recreation consult Therapeutic Recreation Interventions: Pet therapy Patient destination: Home Follow Up Recommendations: Home health OT Equipment Recommended: To be determined  Skilled Therapeutic Intervention Skilled OT session completed with focus on initial evaluation, education on OT role/POC, and establishment of patient-centered goals. Pt greeted in w/c with family present upon arrival. Unable to identify spouse or close relative. Pt participated in bathing, dressing, toileting, and grooming tasks during session. All functional transfers completed with steady assist via stand pivot. Pt required cues mostly for initiation sustained attention during aforementioned tasks. She would perseverate on washing certain body areas or rewashed them in shower. When having BM on toilet, and asked if she was finished, pt responded: "with what?" Min A for LB dressing w/c level at sink with pt requiring increased time to meet motor demands of tasks. She then stood with steady assist to complete oral care. Pt required cues to fully scan Rt>Lt on sink for necessary items. Similar cuing required throughout ADL when looking for what she needed. At end of session pt was left in w/c with safety belt fastened and family present.   OT Evaluation Precautions/Restrictions  Precautions Precautions: Fall Restrictions Weight Bearing Restrictions: No General Chart Reviewed: Yes Family/Caregiver Present: Yes(spouse Sheryl James) Vital Signs Therapy Vitals Temp: (!) 97.5 F (36.4 C) Temp Source: Oral Pulse Rate: 87 Resp: 19 BP: 130/72 Patient Position (if appropriate): Sitting Oxygen Therapy SpO2: 97 % O2 Device: Room Air Pain No s/s pain during session    Home Living/Prior  Functioning Home Living Available Help at Discharge: Family, Available PRN/intermittently Type of Home: House Home Access: Stairs to enter Technical brewer of Steps: 3 Home Layout: One level Bathroom Shower/Tub: Multimedia programmer: Standard Bathroom Accessibility: Yes Additional Comments: accessible via walker  Lives With: Spouse IADL History Homemaking Responsibilities: (Pt poor historian and unable to provide reliable PLOF information) Leisure and Hobbies: Medical sales representative  Prior Function Level of Independence: Independent with basic ADLs, Independent with homemaking with ambulation, Independent with transfers(per chart) ADL ADL Eating: Not assessed Grooming: Contact guard Where Assessed-Grooming: Standing at sink Upper Body Bathing: Supervision/safety, Moderate cueing Where Assessed-Upper Body Bathing: Shower Lower Body Bathing: Contact guard, Moderate cueing Where Assessed-Lower Body Bathing: Administrator, sports  Dressing: Supervision/safety, Minimal cueing Where Assessed-Upper Body Dressing: Sitting at sink Lower Body Dressing: Minimal assistance, Minimal cueing Where Assessed-Lower Body Dressing: Standing at sink, Sitting at sink Toileting: Contact guard Where Assessed-Toileting: Glass blower/designer: Psychiatric nurse Method: Arts development officer: Energy manager: Environmental education officer Method: Radiographer, therapeutic: Shower seat with back, Grab bars ADL Comments: Most cuing required for initiation and sustained attention  Vision Baseline Vision/History: Wears glasses Wears Glasses: At all times Patient Visual Report: No change from baseline Vision Assessment?: No apparent visual deficits(Able to read small print on ADL items, wall clock, and room number on wall board. Cues required for fully scanning Rt>Lt during functional activity) Additional Comments: inconsistent R and  L inattention  Perception  Perception: Within Functional Limits Praxis Praxis: Impaired Praxis Impairment Details: Initiation;Perseveration Praxis-Other Comments: During bathing, dressing, and toileting tasks Cognition Overall Cognitive Status: Impaired/Different from baseline Arousal/Alertness: Awake/alert Orientation Level: Person;Place Year: (2012) Month: January Day of Week: Correct Memory: Impaired Memory Impairment: Storage deficit Immediate Memory Recall: Sock;Blue;Bed Memory Recall: Blue;Bed;Sock Memory Recall Sock: With Cue Memory Recall Blue: Without Cue Memory Recall Bed: Without Cue Attention: Sustained Sustained Attention: Impaired Awareness: Impaired Problem Solving: Impaired Executive Function: Initiating Initiating: Impaired Behaviors: (flat affect) Safety/Judgment: Impaired Sensation Sensation Light Touch: Not tested Hot/Cold: Appears Intact Coordination Gross Motor Movements are Fluid and Coordinated: No Fine Motor Movements are Fluid and Coordinated: Yes Coordination and Movement Description: Motor movements slow and deliberate  Motor  Motor Motor: Motor apraxia Motor - Skilled Clinical Observations: mild apraxia and initiation deficits  Mobility  Transfers Sit to Stand: Minimal Assistance - Patient > 75%  Stand pivot bathroom transfers: Minimal Assistance  Trunk/Postural Assessment  Cervical Assessment Cervical Assessment: Exceptions to WFL(forward head ) Thoracic Assessment Thoracic Assessment: Exceptions to WFL(Dowager's hump) Lumbar Assessment Lumbar Assessment: Exceptions to WFL(posterior pelvic tilt) Postural Control Postural Control: Deficits on evaluation(Mod A required for correcting small LOBs when standing during LB self care) Balance Balance Balance Assessed: Yes Dynamic Sitting Balance Dynamic Sitting - Balance Support: Right upper extremity supported;Feet supported(completing perihygiene on toilet) Dynamic Sitting - Level of  Assistance: 4: Min assist Dynamic Standing Balance Dynamic Standing - Balance Support: During functional activity;Left upper extremity supported Dynamic Standing - Level of Assistance: 4: Min assist Dynamic Standing - Balance Activities: Lateral lean/weight shifting;Forward lean/weight shifting Dynamic Standing - Comments: Completing perihygiene in shower Extremity/Trunk Assessment RUE Assessment RUE Assessment: Within Functional Limits LUE Assessment LUE Assessment: Within Functional Limits     Refer to Care Plan for Long Term Goals  Recommendations for other services: Therapeutic Recreation  Pet therapy   Discharge Criteria: Patient will be discharged from OT if patient refuses treatment 3 consecutive times without medical reason, if treatment goals not met, if there is a change in medical status, if patient makes no progress towards goals or if patient is discharged from hospital.  The above assessment, treatment plan, treatment alternatives and goals were discussed and mutually agreed upon: by patient  Skeet Simmer 12/27/2017, 4:44 PM

## 2017-12-27 NOTE — Progress Notes (Signed)
Foley cath followed up with Dr. Burnice Logan: keep it for 24 hours; follow up tomorrow for  voiding trial.

## 2017-12-27 NOTE — Progress Notes (Signed)
Sheryl James is a 62 y.o. female 1955/05/18 [id number]  Subjective: No new complaints. No new problems. Slept well. Feeling OK.  Objective: Vital signs in last 24 hours: Temp:  [98 F (36.7 C)-98.2 F (36.8 C)] 98 F (36.7 C) (11/02 0601) Pulse Rate:  [76-85] 85 (11/02 0601) Resp:  [14-24] 18 (11/02 0601) BP: (130-152)/(64-79) 152/79 (11/02 0601) SpO2:  [94 %-95 %] 94 % (11/02 0601) Weight change:  Last BM Date: 12/26/17  Intake/Output from previous day: 11/01 0701 - 11/02 0700 In: 500 [P.O.:500] Out: 625 [Urine:625] Last cbgs: CBG (last 3)  No results for input(s): GLUCAP in the last 72 hours.   Physical Exam General: No apparent distress.  In bed HEENT: not dry Lungs: Normal effort. Lungs clear to auscultation, no crackles or wheezes. Cardiovascular: Regular rate and rhythm, no edema Abdomen: S/NT/ND; BS(+) Musculoskeletal:  unchanged Neurological: No new neurological deficits Wounds: clean  Skin: clear  Aging changes Mental state: Alert, cooperative    Lab Results: BMET    Component Value Date/Time   NA 138 12/14/2017 1230   K 4.2 12/14/2017 1230   CL 108 12/14/2017 1230   CO2 23 12/14/2017 1230   GLUCOSE 102 (H) 12/14/2017 1230   BUN <5 (L) 12/14/2017 1230   CREATININE 0.66 12/14/2017 1230   CALCIUM 9.1 12/14/2017 1230   GFRNONAA >60 12/14/2017 1230   GFRAA >60 12/14/2017 1230   CBC    Component Value Date/Time   WBC 8.4 12/11/2017 1529   RBC 3.82 (L) 12/11/2017 1529   HGB 11.5 (L) 12/11/2017 1529   HCT 35.4 (L) 12/11/2017 1529   PLT 469 (H) 12/11/2017 1529   MCV 92.7 12/11/2017 1529   MCH 30.1 12/11/2017 1529   MCHC 32.5 12/11/2017 1529   RDW 14.6 12/11/2017 1529   LYMPHSABS 1.8 11/29/2017 0432   MONOABS 1.2 (H) 11/29/2017 0432   EOSABS 0.1 11/29/2017 0432   BASOSABS 0.0 11/29/2017 0432    Studies/Results: No results found.  Medications: I have reviewed the patient's current medications.  Assessment/Plan:  1.  Bilateral medial  frontal/temporal subarachnoid hemorrhage due to aneurysmal bleeding.  Status post basilar tip aneurysm coiling.  Continue with CIR 2.  DVT prophylaxis with SCDs 3.  Pain management.  Ultram as needed 4.  Wound care 5.  Seizure prophylaxis with Keppra twice a day 6.  Urinary retention.  On your recall and 3 times daily.  Flomax.  Check PVR x3 7.  Diarrhea, improved.  Imodium as needed.  Florastor 8.  Tobacco abuse. 9.  Pseudomonas UTI.  The patient completed Cipro     Length of stay, days: 1  Walker Kehr , MD 12/27/2017, 1:33 PM

## 2017-12-27 NOTE — Evaluation (Addendum)
Speech Language Pathology Assessment and Plan  Patient Details  Name: Sheryl James MRN: [id number] Date of Birth: 1955/04/05  SLP Diagnosis: Cognitive Impairments  Rehab Potential: Good ELOS: 14-20 days     Today's Date: 12/27/2017 SLP Individual Time: 0804-0900 SLP Individual Time Calculation (min): 56 min   Problem List:  Patient Active Problem List   Diagnosis Date Noted  . SAH (subarachnoid hemorrhage) ([place]) 12/26/2017  . Subarachnoid hemorrhage due to ruptured aneurysm ([place]) 11/26/2017   Past Medical History: History reviewed. No pertinent past medical history. Past Surgical History:  Past Surgical History:  Procedure Laterality Date  . IR ANGIO INTRA EXTRACRAN SEL INTERNAL CAROTID BILAT MOD SED  11/27/2017  . IR ANGIO VERTEBRAL SEL VERTEBRAL UNI L MOD SED  11/27/2017  . IR ANGIOGRAM FOLLOW UP STUDY  11/27/2017  . IR ANGIOGRAM FOLLOW UP STUDY  11/27/2017  . IR ANGIOGRAM FOLLOW UP STUDY  11/27/2017  . IR ANGIOGRAM FOLLOW UP STUDY  11/27/2017  . IR TRANSCATH/EMBOLIZ  11/27/2017  . IR US GUIDE VASC ACCESS RIGHT  11/27/2017  . RADIOLOGY WITH ANESTHESIA N/A 11/27/2017   Procedure: IR WITH ANESTHESIA;  Surgeon: Consuella Lose, MD;  Location: [facility];  Service: Radiology;  Laterality: N/A;    Assessment / Plan / Recommendation Clinical Impression Sheryl James is a 62 year old right-handed female history of tobacco abuse on no prescription medications.Presented 11/26/2017 with persistent headaches over the previous past few weeks with associated nausea, no vomiting. She had initially been seen at [facility] a few weeks ago prior and work-up negative and she was discharged home. During work-up noted hypertensive advised to follow-up with her PCP. She went to [facility] walk-in [facility] due to persistent headaches dizziness she was placed on antibiotic treatment for possible otitis media. On day of admission 11/26/2017 patient became unresponsive while eating breakfast with jerking  movements lasting 20 to 30 seconds with apparent seizure. Cranial CT scan showed subarachnoid hemorrhage tracking in the midline of each frontal lobe as well as along the middle cerebral artery distribution tracks bilaterally. Mild hemorrhage layering in each lateral ventricle. Prior small infarcts in the head of the caudate nucleus on the right and the left thalamus. There was also appearance concerning for possible aneurysm rupture at the level of the anterior communicating artery. 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 Patient underwent right frontal ventricular catheter placement 11/27/2017 per Dr. Christella Noa. Diagnostic cerebral angiogram 11/27/2017 per Dr. 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 No significant vasospasm seen. Follow-up cranial CT scan showed continued enlargement ventricles and underwent ventriculostomy drain in the right lateral ventricle 12/06/2017. Urine study grade 100,000 Pseudomonas maintained on Cipro. Patient with persistent bouts of diarrhea felt to be initially related to antibiotic therapy with followed by gastroenterology services. C. difficile negative and bowel program regulated with improvement and no further bouts of recurrent diarrhea. Her diet has been advanced to regular. She remains on Keppra for seizure prophylaxis. Intermittent bouts of urinary retention maintained on Urecholine as well as Flomax.  Patient with staring episode with lack of verbal response after therapy session today 12/26/2017. Incontinent of BM and vomited. RN notified Dr. Christella Noa and orders received. Pt alert and verbal in chair prior to CIR admit with spouse at bedside. Dr. Naaman Plummer in to assess patient prior to approval to admit to CIR.  Pt presents with moderate-severe cognitive lingustic impairments, deficits  include delayed processing impacting sustained  attention of functional task, initiation in verbal/functional response, short term recall, intellectual awareness and varying left inattention which was further supported by formal cognitive linguistic assessment utilizing Cognistat. Pt demonstrates ability to follow 1-2 step directions, name objects, perform some high level math calcuations, however given functional task requires increased cuing. Pt presents with flat affect and limited verbal output at phrase level. Pt presents with [facility] in swallow function, however appeared slight swallow delay with no overt s/s aspiration on regular textures and thin via cup/straw . SLP recommends regular and thin diet, medication whole in puree/crushed if large and full supervision A due to cognitive impairment. Husband signed off to provided supervision A. Pt would benefit from skilled ST services in order to maximize functional independence and reduce burden of care, likely requiring supervision at discharge with continued skilled ST services.   Skilled Therapeutic Interventions          Skilled ST services focused on cognitive skills. SLP facilitated cognitive linguistic assessment administration of Cognistat. SLP educated pt and pt's husband on results and goals set for [employer]. Pt and husband in agreement. SLP facilitated basic problem solving brush teeth, pt required max-mod A verbal cues, perseverating on task and requiringcues to initiate to step step in task. Pt was left in room with call bell within reach, husband in room and bed alarm set. SLP reccomends to continue skilled services.   SLP Assessment  Patient will need skilled Speech Lanaguage Pathology Services during CIR admission    Recommendations  SLP Diet Recommendations: Thin Liquid Administration via: Cup;Straw Medication Administration: Whole meds with puree Supervision: Full supervision/cueing for compensatory strategies;Trained caregiver to feed patient Compensations: Minimize environmental  distractions;Slow rate;Small sips/bites Postural Changes and/or Swallow Maneuvers: Seated upright 90 degrees Oral Care Recommendations: Oral care BID Patient destination: Home Follow up Recommendations: Outpatient SLP;Home Health SLP;24 hour supervision/assistance Equipment Recommended: None recommended by SLP    SLP Frequency 3 to 5 out of 7 days   SLP Duration  SLP Intensity  SLP Treatment/Interventions 14-20 days   Minumum of 1-2 x/day, 30 to 90 minutes  Cognitive remediation/compensation;Cueing hierarchy;Patient/family education    Pain Pain Assessment Pain Scale: 0-10 Pain Score: 0-No pain  Prior Functioning Cognitive/Linguistic Baseline: Within functional limits Type of Home: House  Lives With: Spouse Available Help at Discharge: Family;Available PRN/intermittently Vocation: Retired  Industrial/product designer Term Goals: Week 1: SLP Short Term Goal 1 (Week 1): Pt will demonstrate sustained attention to functional task for 2 minutes with min A verbal cues for redirection., SLP Short Term Goal 2 (Week 1): Pt will complete basic, functional tasks with Mod A verbal cues for functional problem solving. SLP Short Term Goal 3 (Week 1): Pt will initate verbal and functional response within 7 seconds with min A verbal cues.  SLP Short Term Goal 4 (Week 1): Pt will identify 2 physical and 2 cognitive deficits with mod A verbal and contextual cues. SLP Short Term Goal 5 (Week 1): Pt will express wants/needs at phrase level mod A verbal cues to increase verbal output. SLP Short Term Goal 6 (Week 1): Pt will utilize external memory aids to recall new, daily information with mod A verbal and questions cues.  Refer to Care Plan for Long Term Goals  Recommendations for other services: None   Discharge Criteria: Patient will be discharged from SLP if patient refuses treatment 3 consecutive times without medical reason, if treatment goals not met, if there is a change in medical status,  if patient makes no  progress towards goals or if patient is discharged from hospital.  The above assessment, treatment plan, treatment alternatives and goals were discussed and mutually agreed upon: by patient and by family  Jushua Waltman  [facility] 12/27/2017, 10:23 AM

## 2017-12-28 DIAGNOSIS — R197 Diarrhea, unspecified: Secondary | ICD-10-CM

## 2017-12-28 NOTE — Progress Notes (Signed)
Sheryl James is a 62 y.o. female 1955/10/28 [id number]  Subjective: No new complaints. Tired. No new problems. Slept well. Feeling OK.  Objective: Vital signs in last 24 hours: Temp:  [97.5 F (36.4 C)-97.9 F (36.6 C)] 97.9 F (36.6 C) (11/02 2031) Pulse Rate:  [82-89] 82 (11/03 0409) Resp:  [18-19] 18 (11/03 0409) BP: (130-145)/(58-72) 141/69 (11/03 0409) SpO2:  [94 %-97 %] 94 % (11/03 0409) Weight change:  Last BM Date: 12/27/17  Intake/Output from previous day: 11/02 0701 - 11/03 0700 In: 720 [P.O.:720] Out: 250 [Urine:250] Last cbgs: CBG (last 3)  No results for input(s): GLUCAP in the last 72 hours.   Physical Exam General: No apparent distress.  Husband is at the bedside HEENT: not dry Lungs: Normal effort. Lungs clear to auscultation, no crackles or wheezes. Cardiovascular: Regular rate and rhythm, no edema Abdomen: S/NT/ND; BS(+) Foley cat Musculoskeletal:  unchanged Neurological: No new neurological deficits Wounds: clean    Skin: clear  Aging changes Mental state: Alert,  cooperative    Lab Results: BMET    Component Value Date/Time   NA 138 12/14/2017 1230   K 4.2 12/14/2017 1230   CL 108 12/14/2017 1230   CO2 23 12/14/2017 1230   GLUCOSE 102 (H) 12/14/2017 1230   BUN <5 (L) 12/14/2017 1230   CREATININE 0.66 12/14/2017 1230   CALCIUM 9.1 12/14/2017 1230   GFRNONAA >60 12/14/2017 1230   GFRAA >60 12/14/2017 1230   CBC    Component Value Date/Time   WBC 8.4 12/11/2017 1529   RBC 3.82 (L) 12/11/2017 1529   HGB 11.5 (L) 12/11/2017 1529   HCT 35.4 (L) 12/11/2017 1529   PLT 469 (H) 12/11/2017 1529   MCV 92.7 12/11/2017 1529   MCH 30.1 12/11/2017 1529   MCHC 32.5 12/11/2017 1529   RDW 14.6 12/11/2017 1529   LYMPHSABS 1.8 11/29/2017 0432   MONOABS 1.2 (H) 11/29/2017 0432   EOSABS 0.1 11/29/2017 0432   BASOSABS 0.0 11/29/2017 0432    Studies/Results: No results found.  Medications: I have reviewed the patient's current  medications.  Assessment/Plan:    1.  Bilateral medial frontal/temporal subarachnoid hemorrhage due to aneurysmal bleeding.  Status post basilar tip aneurysm coiling.  Continue with CIR 2.  DVT prophylaxis-SCDs 3.  Pain management with Ultram as needed 4.  Wound care 5.  Seizure prophylaxis with Keppra twice daily 6.  Urinary retention.  Flomax.  The patient and her husband would like to keep the Foley catheter in today to help her rest and to discontinue it tomorrow with his post void trials when she restarts therapy 7.  Diarrhea.  Imodium as needed, Florastor.  Better 8.  History of tobacco abuse 9.  History of Pseudomonas UTI.  The patient has finished Cipro.    Length of stay, days: 2  Walker Kehr , MD 12/28/2017, 10:27 AM

## 2017-12-28 NOTE — Plan of Care (Signed)
  Problem: RH BLADDER ELIMINATION Goal: RH STG MANAGE BLADDER WITH ASSISTANCE Description STG Manage Bladder With Assistance Outcome: Not Progressing; urinary cath

## 2017-12-28 NOTE — Progress Notes (Signed)
Followed up foley cath with Dr. Alain Marion he wants  foley cath to stay  today and start trial voids in the morning.

## 2017-12-29 ENCOUNTER — Inpatient Hospital Stay (HOSPITAL_COMMUNITY): Payer: 59 | Admitting: Speech Pathology

## 2017-12-29 ENCOUNTER — Inpatient Hospital Stay (HOSPITAL_COMMUNITY): Payer: 59 | Admitting: Physical Therapy

## 2017-12-29 ENCOUNTER — Inpatient Hospital Stay (HOSPITAL_COMMUNITY): Payer: 59 | Admitting: Occupational Therapy

## 2017-12-29 DIAGNOSIS — Z298 Encounter for other specified prophylactic measures: Secondary | ICD-10-CM

## 2017-12-29 DIAGNOSIS — E8809 Other disorders of plasma-protein metabolism, not elsewhere classified: Secondary | ICD-10-CM

## 2017-12-29 DIAGNOSIS — E46 Unspecified protein-calorie malnutrition: Secondary | ICD-10-CM

## 2017-12-29 DIAGNOSIS — R339 Retention of urine, unspecified: Secondary | ICD-10-CM

## 2017-12-29 DIAGNOSIS — Z2989 Encounter for other specified prophylactic measures: Secondary | ICD-10-CM

## 2017-12-29 LAB — CBC WITH DIFFERENTIAL/PLATELET
ABS IMMATURE GRANULOCYTES: 0.04 10*3/uL (ref 0.00–0.07)
BASOS ABS: 0.1 10*3/uL (ref 0.0–0.1)
Basophils Relative: 1 %
Eosinophils Absolute: 0.3 10*3/uL (ref 0.0–0.5)
Eosinophils Relative: 4 %
HEMATOCRIT: 38.7 % (ref 36.0–46.0)
Hemoglobin: 12.1 g/dL (ref 12.0–15.0)
IMMATURE GRANULOCYTES: 1 %
LYMPHS ABS: 2.1 10*3/uL (ref 0.7–4.0)
Lymphocytes Relative: 28 %
MCH: 28.9 pg (ref 26.0–34.0)
MCHC: 31.3 g/dL (ref 30.0–36.0)
MCV: 92.4 fL (ref 80.0–100.0)
MONOS PCT: 13 %
Monocytes Absolute: 1 10*3/uL (ref 0.1–1.0)
NEUTROS ABS: 4.2 10*3/uL (ref 1.7–7.7)
NRBC: 0 % (ref 0.0–0.2)
Neutrophils Relative %: 53 %
PLATELETS: 333 10*3/uL (ref 150–400)
RBC: 4.19 MIL/uL (ref 3.87–5.11)
RDW: 14 % (ref 11.5–15.5)
WBC: 7.7 10*3/uL (ref 4.0–10.5)

## 2017-12-29 LAB — COMPREHENSIVE METABOLIC PANEL
ALBUMIN: 3 g/dL — AB (ref 3.5–5.0)
ALT: 10 U/L (ref 0–44)
AST: 16 U/L (ref 15–41)
Alkaline Phosphatase: 69 U/L (ref 38–126)
Anion gap: 9 (ref 5–15)
BILIRUBIN TOTAL: 0.5 mg/dL (ref 0.3–1.2)
BUN: 11 mg/dL (ref 8–23)
CHLORIDE: 107 mmol/L (ref 98–111)
CO2: 25 mmol/L (ref 22–32)
CREATININE: 0.45 mg/dL (ref 0.44–1.00)
Calcium: 9 mg/dL (ref 8.9–10.3)
GFR calc Af Amer: >60 mL/min (ref >60)
GLUCOSE: 88 mg/dL (ref 70–99)
Potassium: 3.9 mmol/L (ref 3.5–5.1)
Sodium: 141 mmol/L (ref 135–145)
Total Protein: 5.9 g/dL — ABNORMAL LOW (ref 6.5–8.1)

## 2017-12-29 MED ORDER — PRO-STAT SUGAR FREE PO LIQD
30.0000 mL | Freq: Two times a day (BID) | ORAL | Status: DC
  Administered 2017-12-29 – 2018-01-07 (×18): 30 mL via ORAL
  Filled 2017-12-29 (×20): qty 30

## 2017-12-29 NOTE — Progress Notes (Signed)
Physical Therapy Session Note  Patient Details  Name: Sheryl James MRN: [id number] Date of Birth: [birth year]  Today's Date: 12/29/2017 PT Individual Time: 1300-1345 PT Individual Time Calculation (min): 45 min   Short Term Goals: Week 1:  PT Short Term Goal 1 (Week 1): Pt will maintain dynamic sitting balance during mobility activities with min assist.  PT Short Term Goal 2 (Week 1): Pt will perform stand pivot transfer from bed to [facility] with min assist. PT Short Term Goal 3 (Week 1): Pt will ambulate up to 150' with LRAD with CGA assist.  PT Short Term Goal 4 (Week 1): Pt will ambulate up and down 3 stairs (6") with two handrails with min assist.   Skilled Therapeutic Interventions/Progress Updates:    Pt received seated in w/c in room, agreeable to PT. No complaints of pain. [name] present during therapy session. Pt transfers with sit to stand and no AD with CGA throughout therapy session. Ambulation from w/c to bathroom with hand-held min A. Toilet transfer with min A, pt is able to complete 3/3 toileting steps with min A for standing balance. Ambulation x 150 ft with no AD and min A for balance, max cueing to attend to task as pt is easily distracted by her environment. Pt exhibits decreased gait speed with onset of fatigue and requires max encouragement from therapist and family member to complete ambulation safely back to her w/c. Standing balance reaching with alt UE to place green clothespins on basketball net overhead and anteriorly. Focus on maintaining standing balance, pt tends to lean posteriorly in standing. Pt is able to report that she is falling posteriorly but unable to self correct without multimodal cueing from therapist. Pt left seated in w/c in room with needs in reach, chair alarm and quick release belt in place, family present.  Therapy Documentation Precautions:  Precautions Precautions: Fall Restrictions Weight Bearing Restrictions: No General: Pain  Assessment Pain Scale: Faces Pain Score: 0-No pain    Therapy/Group: Individual Therapy  Excell Seltzer, PT, DPT  12/29/2017, 3:49 PM

## 2017-12-29 NOTE — Progress Notes (Signed)
Inpatient Rehabilitation  Patient information reviewed and entered into eRehab system by Shonna Deiter M. Rainbow Salman, M.A., CCC/SLP, PPS Coordinator.  Information including medical coding, functional ability and quality indicators will be reviewed and updated through discharge.    

## 2017-12-29 NOTE — Progress Notes (Signed)
Speech Language Pathology Daily Session Note  Patient Details  Name: Sheryl James MRN: [id number] Date of Birth: Mar 02, 1955  Today's Date: 12/29/2017 SLP Individual Time: 0815-0900 SLP Individual Time Calculation (min): 45 min  Short Term Goals: Week 1: SLP Short Term Goal 1 (Week 1): Pt will demonstrate sustained attention to functional task for 2 minutes with min A verbal cues for redirection., SLP Short Term Goal 2 (Week 1): Pt will complete basic, functional tasks with Mod A verbal cues for functional problem solving. SLP Short Term Goal 3 (Week 1): Pt will initate verbal and functional response within 7 seconds with min A verbal cues.  SLP Short Term Goal 4 (Week 1): Pt will identify 2 physical and 2 cognitive deficits with mod A verbal and contextual cues. SLP Short Term Goal 5 (Week 1): Pt will express wants/needs at phrase level mod A verbal cues to increase verbal output. SLP Short Term Goal 6 (Week 1): Pt will utilize external memory aids to recall new, daily information with mod A verbal and questions cues.  Skilled Therapeutic Interventions:  Skilled treatment session focused on cognition goals. SLP facilitated session by providing Mod A cues to recall orientation information to time, location and situation. SLP further facilitated by providing additional time to count money and to count out specified amount of money. Pt with improved selective attention to task for ~ 30 minutes. Pt's husband present and all questions answerer to pt and husband's satisfaction. Pt left upright in bed, bed alarm on and all needs within reach. Continue per current plan of care.      Pain Pain Assessment Pain Scale: 0-10 Pain Score: 0-No pain  Therapy/Group: Individual Therapy  Tonji Elliff 12/29/2017, 9:11 AM

## 2017-12-29 NOTE — Progress Notes (Signed)
[facility] PHYSICAL MEDICINE & REHABILITATION PROGRESS NOTE  Subjective/Complaints: Patient seen laying in bed this morning.  Husband at bedside.  She states she slept well overnight.  ROS: Denies CP, SOB, nausea, vomiting, diarrhea.  Objective: Vital Signs: Blood pressure (!) 153/68, pulse 87, temperature 97.8 F (36.6 C), temperature source Oral, resp. rate 18, SpO2 97 %. No results found. Recent Labs    12/29/17 0543  WBC 7.7  HGB 12.1  HCT 38.7  PLT 333   Recent Labs    12/29/17 0543  NA 141  K 3.9  CL 107  CO2 25  GLUCOSE 88  BUN 11  CREATININE 0.45  CALCIUM 9.0     Physical Exam: BP (!) 153/68 (BP Location: Left Arm)   Pulse 87   Temp 97.8 F (36.6 C) (Oral)   Resp 18   SpO2 97%  Constitutional:NAD.  Vital signs reviewed. HENT:Normocephalic.  Staples intact Eyes:EOMI.  No discharge. Cardiovascular:Normal rate and rhythm. Respiratory:Effort normal.  Clear. GI: She exhibitsno distension.  Bowel sounds normal. Musculoskeletal:No edema or tenderness in extremities Neurological: She isalert and oriented x1. Delay in processing.  Motor: Grossly 4/5 throughout  Skin: She isnot diaphoretic.  See above Psychiatric:flat  Assessment/Plan: 1. Functional deficits secondary to bilateral SAH status post ventriculostomy which require 3+ hours per day of interdisciplinary therapy in a comprehensive inpatient rehab setting.  Physiatrist is providing close team supervision and 24 hour management of active medical problems listed below.  Physiatrist and rehab team continue to assess barriers to discharge/monitor patient progress toward functional and medical goals  Care Tool:  Bathing    Body parts bathed by patient: Right arm, Left arm, Chest, Abdomen, Front perineal area, Buttocks, Right upper leg, Left upper leg, Right lower leg, Left lower leg, Face         Bathing assist Assist Level: Contact Guard/Touching assist     Upper Body  Dressing/Undressing Upper body dressing Upper body dressing/undressing activity did not occur (including orthotics): N/A What is the patient wearing?: Pull over shirt    Upper body assist Assist Level: Supervision/Verbal cueing    Lower Body Dressing/Undressing Lower body dressing    Lower body dressing activity did not occur: N/A What is the patient wearing?: Underwear/pull up, Pants     Lower body assist Assist for lower body dressing: Minimal Assistance - Patient > 75%     Toileting Toileting Toileting Activity did not occur (Clothing management and hygiene only): N/A (no void or bm)  Toileting assist Assist for toileting: Contact Guard/Touching assist     Transfers Chair/bed transfer  Transfers assist     Chair/bed transfer assist level: Moderate Assistance - Patient 50 - 74%     Locomotion Ambulation   Ambulation assist      Assist level: Minimal Assistance - Patient > 75% Assistive device: Walker-rolling Max distance: 60 ft   Walk 10 feet activity   Assist     Assist level: Minimal Assistance - Patient > 75% Assistive device: Walker-rolling   Walk 50 feet activity   Assist    Assist level: Minimal Assistance - Patient > 75% Assistive device: Walker-rolling    Walk 150 feet activity   Assist           Walk 10 feet on uneven surface  activity   Assist           Wheelchair     Assist   Type of Wheelchair: Manual    Wheelchair assist level: Minimal Assistance - Patient >  75% Max wheelchair distance: 30 ft    Wheelchair 50 feet with 2 turns activity    Assist            Wheelchair 150 feet activity     Assist            Medical Problem List and Plan: 1.Decreased functional mobility with cognitive deficitssecondary to bilateral medial frontal/temporalsubarachnoid hemorrhage from aneurysmal bleed. Pt isstatus post basilar tip aneurysm coiling  Cont CIR  Notes reviewed- headaches for some time  ultimately diagnosed with SAH, images reviewed- bilateral SAH, labs reviewed 2. DVT Prophylaxis/Anticoagulation: SCDs 3. Pain Management:Ultram DC'd, Tylenol as needed 4. Mood:Provide emotional support 5. Neuropsych: This patientisnot capable of making decisions on herown behalf. 6. Skin/Wound Care:Routine skin checks 7. Fluids/Electrolytes/Nutrition:Routine in and outs  BMP within normal limits on 11/4 8.Seizure prophylaxis. Keppra 500 mg twice daily 9.Urinary retention. Urecholine 3 times daily, Flomax 0.4 mg daily.   Foley DC'd on 11/4, PVR pending 10.Diarrhea. Improved. Continue Imodium as needed as well as Florastor -encourage PO 11.Tobacco abuse. Provide counseling 12.Pseudomonas UTI. Completedcourse of Cipro. 13.  Hypoalbuminemia  Supplement initiated on 11/4  LOS: 3 days A FACE TO FACE EVALUATION WAS PERFORMED  Ankit Lorie Phenix 12/29/2017, 9:21 AM

## 2017-12-29 NOTE — Progress Notes (Signed)
Occupational Therapy Session Note  Patient Details  Name: Sheryl James MRN: [id number] Date of Birth: 06/10/1955  Today's Date: 12/29/2017 OT Individual Time: 3335-4562 OT Individual Time Calculation (min): 46 min    Short Term Goals: Week 1:  OT Short Term Goal 1 (Week 1): Pt will complete sit<stand during dressing with supervision assist and LRAD OT Short Term Goal 2 (Week 1): Pt will complete LB dressing with CGA  OT Short Term Goal 3 (Week 1): Pt will initiate completion of 1 grooming task during ADL session   Skilled Therapeutic Interventions/Progress Updates:    Pt completed functional mobility down to the therapy gym with min assist and increased time.  Slower gait speed throughout with LOB to the right and some scissoring noted.  Once in the gym had pt sit on the mat and work on PVC pipe puzzle.  She needed overall max instructional/questioning cueing to solve 11 piece puzzle to match the diagram.  At times pt unable to pick out pieces matching the picture when pieces for all together, but when separated and asked to pick out the correct piece from 3 given she was able to complete the task.  Finished session with ambulation back to the room.  Pt inconsistent with PLOF, stating her walking speed is about the same as at home.  Husband present and responded that it is not.  She also reported having a walk-in shower with grab bars when asked on the way back, however her husband states there are no grab bars currently.  Finished session with positioning back in the chair and call button and phone in reach and spouse present.  Pt unable to state place when asked but when given choices, she ws able to pick them out correctly.    Therapy Documentation Precautions:  Precautions Precautions: Fall Restrictions Weight Bearing Restrictions: No  Pain: Pain Assessment Pain Scale: Faces Pain Score: 0-No pain ADL: Therapy/Group: Individual Therapy  Selig Wampole OTR/L 12/29/2017, 3:45 PM
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
## 2017-12-29 NOTE — Progress Notes (Signed)
Physical Therapy Session Note  Patient Details  Name: Sheryl James MRN: [id number] Date of Birth: 08-28-1955  Today's Date: 12/29/2017 PT Individual Time: 0900-0957 PT Individual Time Calculation (min): 57 min   Short Term Goals: Week 1:  PT Short Term Goal 1 (Week 1): Pt will maintain dynamic sitting balance during mobility activities with min assist.  PT Short Term Goal 2 (Week 1): Pt will perform stand pivot transfer from bed to [facility] with min assist. PT Short Term Goal 3 (Week 1): Pt will ambulate up to 150' with LRAD with CGA assist.  PT Short Term Goal 4 (Week 1): Pt will ambulate up and down 3 stairs (6") with two handrails with min assist.   Skilled Therapeutic Interventions/Progress Updates:    pt performs supine to sit with min A.  Sitting balance edge of bed to don shirt and pants with max A for sitting balance due to posterior lean.  Set up for donning pants and shirt.  Pt performs stand pivot transfers throughout session with min A.  Gait training without AD x 110' initially with min A, HHA with Rt hip in ER during gait, occasional scissoring. Pt requires mod A when fatigued due to posterior lean.  Pt performs gait x 110' with RW with min/mod A due to continued posterior lean. Cues for posture throughout gait training.  Standing balance task with squat and reach with focus on forward wt shift with pt improving balance and posture with repetition and tactile cues.  Pt then states need to use restroom. Pt able to perform toilet transfer with min A.  toileting with supervision for hygiene and clothing management.  Pt left in w/c with husband present, needs at hand.  Therapy Documentation Precautions:  Precautions Precautions: Fall Restrictions Weight Bearing Restrictions: No Pain: Pain Assessment Pain Scale: 0-10 Pain Score: 0-No pain    Therapy/Group: Individual Therapy  , 12/29/2017, 10:00 AM

## 2017-12-29 NOTE — Plan of Care (Signed)
  Problem: Consults Goal: RH STROKE PATIENT EDUCATION Description See Patient Education module for education specifics  Outcome: Progressing Goal: Nutrition Consult-if indicated Outcome: Progressing Goal: Diabetes Guidelines if Diabetic/Glucose > 140 Description If diabetic or lab glucose is > 140 mg/dl - Initiate Diabetes/Hyperglycemia Guidelines & Document Interventions  Outcome: Progressing   Problem: RH BOWEL ELIMINATION Goal: RH STG MANAGE BOWEL WITH ASSISTANCE Description STG Manage Bowel with Assistance.Min  Outcome: Progressing Goal: RH STG MANAGE BOWEL W/MEDICATION W/ASSISTANCE Description STG Manage Bowel with Medication with Assistance. min  Outcome: Progressing   Problem: RH BLADDER ELIMINATION Goal: RH STG MANAGE BLADDER WITH ASSISTANCE Description STG Manage Bladder With min assistance  Outcome: Progressing Goal: RH STG MANAGE BLADDER WITH MEDICATION WITH ASSISTANCE Description STG Manage Bladder With Medication With min Assistance.  Outcome: Progressing   Problem: RH SKIN INTEGRITY Goal: RH STG SKIN FREE OF INFECTION/BREAKDOWN Description Free of infection and breakdown with min assist  Outcome: Progressing Goal: RH STG MAINTAIN SKIN INTEGRITY WITH ASSISTANCE Description STG Maintain Skin Integrity With Assistance. Outcome: Progressing Goal: RH STG ABLE TO PERFORM INCISION/WOUND CARE W/ASSISTANCE Description STG Able To Perform Incision/Wound Care With Assistance. min  Outcome: Progressing   Problem: RH SAFETY Goal: RH STG ADHERE TO SAFETY PRECAUTIONS W/ASSISTANCE/DEVICE Description STG Adhere to Safety Precautions With min Assistance/Device.  Outcome: Progressing Goal: RH STG DECREASED RISK OF FALL WITH ASSISTANCE Description STG Decreased Risk of Fall With min Assistance.  Outcome: Progressing   Problem: RH COGNITION-NURSING Goal: RH STG USES MEMORY AIDS/STRATEGIES W/ASSIST TO PROBLEM SOLVE Description STG Uses Memory Aids/Strategies With  Assistance to Problem Solve. min  Outcome: Progressing Goal: RH STG ANTICIPATES NEEDS/CALLS FOR ASSIST W/ASSIST/CUES Description STG Anticipates Needs/Calls for Assist With Assistance/Cues. min  Outcome: Progressing   Problem: RH PAIN MANAGEMENT Goal: RH STG PAIN MANAGED AT OR BELOW PT'S PAIN GOAL Description Less than 3  Outcome: Progressing   Problem: RH KNOWLEDGE DEFICIT Goal: RH STG INCREASE KNOWLEDGE OF HYPERTENSION Description Patient or family will be able to verbalize management of blood pressure including monitoring, target bp range and medications  Outcome: Progressing Goal: RH STG INCREASE KNOWLEDGE OF DYSPHAGIA/FLUID INTAKE Outcome: Progressing   Problem: RH Vision Goal: RH LTG Vision (Specify) Outcome: Progressing   Problem: RH Pre-functional/Other (Specify) Goal: RH LTG Pre-functional (Specify) Outcome: Progressing Goal: RH LTG Interdisciplinary (Specify) 1 Description RH LTG Interdisciplinary (Specify)1 Outcome: Progressing Goal: RH LTG Interdisciplinary (Specify) 2 Description RH LTG Interdisciplinary (Specify) 2  Outcome: Progressing

## 2017-12-29 NOTE — IPOC Note (Signed)
Overall Plan of Care [facility]) Patient Details Name: Sheryl James MRN: [id number] DOB: [birth year]  Admitting Diagnosis: SAH s/p ventriculostomy  Hospital Problems: Active Problems:   SAH (subarachnoid hemorrhage) ([place])     Functional Problem List: Nursing Behavior, Bladder, Bowel, Endurance, Medication Management, Nutrition, Pain, Perception, Safety, Sensory, Skin Integrity  PT Balance, Behavior, Edema, Endurance, Motor, Nutrition, Safety, Sensory, Skin Integrity, Perception  OT Balance, Skin Integrity, Cognition, Endurance, Motor, Perception, Safety, Sensory  SLP    TR         Basic ADL's: OT Eating, Grooming, Bathing, Dressing, Toileting     Advanced  ADL's: OT Simple Meal Preparation     Transfers: PT Bed Mobility, Bed to Chair, Car, Furniture, Futures trader, Metallurgist: PT Ambulation, Emergency planning/management officer, Stairs     Additional Impairments: OT None  SLP Communication expression(limited verbal output, flat) Problem Solving, Attention, Memory, Awareness  TR      Anticipated Outcomes Item Anticipated Outcome  Self Feeding Supervision/cuing   Swallowing      Basic self-care  Supervision/cuing  Toileting  Supervision/cuing    Bathroom Transfers Supervision/cuing   Bowel/Bladder  min assist  Transfers  supervision with LRAD  Locomotion  ambulatory at supervision level with LRAD  Communication     Cognition  Min- Supervision A  Pain  less<2  Safety/Judgment  min assist   Therapy Plan: PT Intensity: Minimum of 1-2 x/day ,45 to 90 minutes PT Frequency: 5 out of 7 days PT Duration Estimated Length of Stay: 14-16 OT Intensity: Minimum of 1-2 x/day, 45 to 90 minutes OT Frequency: 5 out of 7 days OT Duration/Estimated Length of Stay: 11-13 days SLP Intensity: Minumum of 1-2 x/day, 30 to 90 minutes SLP Frequency: 3 to 5 out of 7 days SLP Duration/Estimated Length of Stay: 14-20 days     Team Interventions: Nursing Interventions  Patient/Family Education, Bowel Management, Pain Management, Skin Care/Wound Management, Dysphagia/Aspiration Precaution Training, Psychosocial Support, Bladder Management, Disease Management/Prevention, Medication Management, Cognitive Remediation/Compensation, Discharge Planning  PT interventions Ambulation/gait training, Community reintegration, DME/adaptive equipment instruction, Neuromuscular re-education, Psychosocial support, Stair training, UE/LE Strength taining/ROM, Wheelchair propulsion/positioning, Training and development officer, Discharge planning, Skin care/wound management, Therapeutic Activities, UE/LE Coordination activities, Cognitive remediation/compensation, Disease management/prevention, Functional mobility training, Patient/family education, Splinting/orthotics, Therapeutic Exercise, Visual/perceptual remediation/compensation, Pain management  OT Interventions Balance/vestibular training, Disease mangement/prevention, Neuromuscular re-education, Self Care/advanced ADL retraining, Therapeutic Exercise, Wheelchair propulsion/positioning, UE/LE Strength taining/ROM, Skin care/wound managment, Pain management, DME/adaptive equipment instruction, Cognitive remediation/compensation, Academic librarian, Barrister's clerk education, UE/LE Coordination activities, Visual/perceptual remediation/compensation, Therapeutic Activities, Psychosocial support, Functional mobility training, Discharge planning  SLP Interventions Cognitive remediation/compensation, English as a second language teacher, Patient/family education  TR Interventions    SW/CM Interventions Discharge Planning, Psychosocial Support, Patient/Family Education   Barriers to Discharge MD  Medical stability and Behavior  Nursing      PT Inaccessible home environment, Medical stability, Home environment access/layout, Incontinence, Behavior cognitive deficits present  OT Decreased caregiver support, Medical stability, Incontinence    SLP      SW        Team Discharge Planning: Destination: PT-Home ,OT- Home , SLP-Home Projected Follow-up: PT-Home health PT, OT-  Home health OT, SLP-Outpatient SLP, Home Health SLP, 24 hour supervision/assistance Projected Equipment Needs: PT-Rolling walker with 5" wheels, Wheelchair (measurements), Wheelchair cushion (measurements), To be determined, OT- To be determined, SLP-None recommended by SLP Equipment Details: PT- , OT-  Patient/family involved in discharge planning: PT- Patient, Family Midwife,  OT-Patient, Family member/caregiver, SLP-Patient, Family member/caregiver  MD  ELOS: 10-14 days. Medical Rehab Prognosis:  Good Assessment: 62 year old right-handed female history of tobacco abuse on no prescription medications.Presented 11/26/2017 with persistent headaches over the previous past few weeks with associated nausea, no vomiting. She had initially been seen at [facility] long a few weeks ago prior and work-up negative and she was discharged home. During work-up noted hypertensive advised to follow-up with her PCP. She went to [place] walk-in clinic due to persistent headaches dizziness she was placed on antibiotic treatment for possible otitis media. On day of admission 11/26/2017 patient became unresponsive while eating breakfast with jerking movements lasting 20 to 30 seconds with apparent seizure. Cranial CT scan reviewed, showing SAH.  Per report, subarachnoid hemorrhage tracking in the midline of each frontal lobe as well as along the middle cerebral artery distribution tracks bilaterally. Mild hemorrhage layering in each lateral ventricle. Prior small infarcts in the head of the caudate nucleus on the right and the left thalamus. There was also appearance concerning for possible aneurysm rupture at the level of the anterior communicating artery. 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 Patient  underwent right frontal ventricular catheter placement 11/27/2017 per Dr. Christella Noa. Diagnostic cerebral angiogram 11/27/2017 per Dr. 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 No significant vasospasm seen. Follow-up cranial CT scan showed continued enlargement ventricles and underwent ventriculostomy drain in the right lateral ventricle 12/06/2017. Urine study grade 100,000 Pseudomonas maintained on Cipro. Patient with persistent bouts of diarrhea felt to be initially related to antibiotic therapy with followed by gastroenterology services. C. difficile negative and bowel program regulated with improvement and no further bouts of recurrent diarrhea. Her diet has been advanced to regular. She remains on Keppra for seizure prophylaxis. Intermittent bouts of urinary retention maintained on Urecholine as well as Flomax. Patient with resulting functional deficits with mobility, endurance, balance self-care. Will set goal for supervision with PT/OT and Min A with SLP.  See Team Conference Notes for weekly updates to the plan of care

## 2017-12-30 ENCOUNTER — Inpatient Hospital Stay (HOSPITAL_COMMUNITY): Payer: 59 | Admitting: Occupational Therapy

## 2017-12-30 ENCOUNTER — Inpatient Hospital Stay (HOSPITAL_COMMUNITY): Payer: 59 | Admitting: Speech Pathology

## 2017-12-30 ENCOUNTER — Inpatient Hospital Stay (HOSPITAL_COMMUNITY): Payer: 59 | Admitting: Physical Therapy

## 2017-12-30 DIAGNOSIS — I10 Essential (primary) hypertension: Secondary | ICD-10-CM

## 2017-12-30 MED ORDER — LISINOPRIL 5 MG PO TABS
2.5000 mg | ORAL_TABLET | Freq: Every day | ORAL | Status: DC
  Administered 2017-12-30 – 2018-01-08 (×10): 2.5 mg via ORAL
  Filled 2017-12-30 (×10): qty 1

## 2017-12-30 NOTE — Progress Notes (Signed)
Physical Therapy Session Note  Patient Details  Name: Sheryl James MRN: [id number] Date of Birth: 1955/09/30  Today's Date: 12/30/2017 PT Individual Time: 0803-0900 PT Individual Time Calculation (min): 57 min   Short Term Goals: Week 1:  PT Short Term Goal 1 (Week 1): Pt will maintain dynamic sitting balance during mobility activities with min assist.  PT Short Term Goal 2 (Week 1): Pt will perform stand pivot transfer from bed to [facility] with min assist. PT Short Term Goal 3 (Week 1): Pt will ambulate up to 150' with LRAD with CGA assist.  PT Short Term Goal 4 (Week 1): Pt will ambulate up and down 3 stairs (6") with two handrails with min assist.   Skilled Therapeutic Interventions/Progress Updates: Pt presented in w/c with son present agreeable to therapy. Pt agreeable to perform washing/bathing this session. Pt ambulated to bathroom and was able to doff clothing sitting at shower chair. Pt attempted to doff pants in standing required minA due to increased posterior lean. Pt required mod verbal cues for safety awareness while bathing however was able to wash all but back without assist. Pt donned brief, pants, sweatshirt, and socks with verbal cues for perform in sitting vs standing for safety due to posterior lean. HHA ambulation back to w/c with vc's to not reach for walls/furniture. After brief rest pt ambulated to rehab gym HHA with verbal cues for increasing step length, looking forward, and maintaining wt shift anteriorly to minimize posterior lean. Participated in standing balance activities with rest wedge to encourage anterior wt shifting. Pt required mod multimodal cues for correction when pt would begin to go into posterior lean. Pt also participated in toe taps/coordination on level tile with emphasis returning to midline and correcting posterior lean requiring mod multimodal cues. After brief rest, pt ambulated back to room with HHA with pt requiring min/mod verbal cues for increasing BOS,  keeping eyes forward and increasing step length. Pt returned to w/c on back in room and pt left with belt alarm on, call bell within reach, and family present.      Therapy Documentation Precautions:  Precautions Precautions: Fall Restrictions Weight Bearing Restrictions: No General:   Vital Signs:     Therapy/Group: Individual Therapy  Tiann Saha  Alden Feagan, PTA  12/30/2017, 12:32 PM

## 2017-12-30 NOTE — Plan of Care (Signed)
  Problem: Consults Goal: RH STROKE PATIENT EDUCATION Description See Patient Education module for education specifics  Outcome: Progressing   Problem: Consults Goal: RH STROKE PATIENT EDUCATION Description See Patient Education module for education specifics  Outcome: Progressing Goal: Nutrition Consult-if indicated Outcome: Progressing Goal: Diabetes Guidelines if Diabetic/Glucose > 140 Description If diabetic or lab glucose is > 140 mg/dl - Initiate Diabetes/Hyperglycemia Guidelines & Document Interventions  Outcome: Progressing   Problem: RH BOWEL ELIMINATION Goal: RH STG MANAGE BOWEL WITH ASSISTANCE Description STG Manage Bowel with Assistance.Min  Outcome: Progressing Goal: RH STG MANAGE BOWEL W/MEDICATION W/ASSISTANCE Description STG Manage Bowel with Medication with Assistance. min  Outcome: Progressing   Problem: RH BLADDER ELIMINATION Goal: RH STG MANAGE BLADDER WITH ASSISTANCE Description STG Manage Bladder With min assistance  Outcome: Progressing Goal: RH STG MANAGE BLADDER WITH MEDICATION WITH ASSISTANCE Description STG Manage Bladder With Medication With min Assistance.  Outcome: Progressing   Problem: RH SKIN INTEGRITY Goal: RH STG SKIN FREE OF INFECTION/BREAKDOWN Description Free of infection and breakdown with min assist  Outcome: Progressing Goal: RH STG MAINTAIN SKIN INTEGRITY WITH ASSISTANCE Description STG Maintain Skin Integrity With Assistance. Outcome: Progressing Goal: RH STG ABLE TO PERFORM INCISION/WOUND CARE W/ASSISTANCE Description STG Able To Perform Incision/Wound Care With Assistance. min  Outcome: Progressing   Problem: RH SAFETY Goal: RH STG ADHERE TO SAFETY PRECAUTIONS W/ASSISTANCE/DEVICE Description STG Adhere to Safety Precautions With min Assistance/Device.  Outcome: Progressing Goal: RH STG DECREASED RISK OF FALL WITH ASSISTANCE Description STG Decreased Risk of Fall With min Assistance.  Outcome: Progressing    Problem: RH COGNITION-NURSING Goal: RH STG USES MEMORY AIDS/STRATEGIES W/ASSIST TO PROBLEM SOLVE Description STG Uses Memory Aids/Strategies With Assistance to Problem Solve. min  Outcome: Progressing Goal: RH STG ANTICIPATES NEEDS/CALLS FOR ASSIST W/ASSIST/CUES Description STG Anticipates Needs/Calls for Assist With Assistance/Cues. min  Outcome: Progressing   Problem: RH PAIN MANAGEMENT Goal: RH STG PAIN MANAGED AT OR BELOW PT'S PAIN GOAL Description Less than 3  Outcome: Progressing   Problem: RH KNOWLEDGE DEFICIT Goal: RH STG INCREASE KNOWLEDGE OF HYPERTENSION Description Patient or family will be able to verbalize management of blood pressure including monitoring, target bp range and medications  Outcome: Progressing Goal: RH STG INCREASE KNOWLEDGE OF DYSPHAGIA/FLUID INTAKE Outcome: Progressing   Problem: RH Vision Goal: RH LTG Vision (Specify) Outcome: Progressing   Problem: RH Pre-functional/Other (Specify) Goal: RH LTG Pre-functional (Specify) Outcome: Progressing Goal: RH LTG Interdisciplinary (Specify) 1 Description RH LTG Interdisciplinary (Specify)1 Outcome: Progressing Goal: RH LTG Interdisciplinary (Specify) 2 Description RH LTG Interdisciplinary (Specify) 2  Outcome: Progressing

## 2017-12-30 NOTE — Progress Notes (Signed)
[facility] PHYSICAL MEDICINE & REHABILITATION PROGRESS NOTE  Subjective/Complaints: Patient seen sitting up in her chair this morning.  Husband at bedside.  Patient states she slept well overnight.  ROS: Denies CP, SOB, nausea, vomiting, diarrhea.  Objective: Vital Signs: Blood pressure (!) 154/70, pulse 80, temperature 98.3 F (36.8 C), temperature source Oral, resp. rate 16, SpO2 95 %. No results found. Recent Labs    12/29/17 0543  WBC 7.7  HGB 12.1  HCT 38.7  PLT 333   Recent Labs    12/29/17 0543  NA 141  K 3.9  CL 107  CO2 25  GLUCOSE 88  BUN 11  CREATININE 0.45  CALCIUM 9.0     Physical Exam: BP (!) 154/70 (BP Location: Left Arm)   Pulse 80   Temp 98.3 F (36.8 C) (Oral)   Resp 16   SpO2 95%  Constitutional:NAD.  Vital signs reviewed. HENT:Normocephalic.  Staples intact Eyes:EOMI.  No discharge. Cardiovascular:RRR.  No JVD.   Respiratory:Effort.  Clear. GI: She exhibitsno distension.  Bowel sounds normal. Musculoskeletal:No edema or tenderness in extremities Neurological: She isalert and oriented x3, except for date of month. Delay in processing.  Motor: Grossly 4/5 throughout, stable Skin: She isnot diaphoretic.  See above Psychiatric:Flat  Assessment/Plan: 1. Functional deficits secondary to bilateral SAH status post ventriculostomy which require 3+ hours per day of interdisciplinary therapy in a comprehensive inpatient rehab setting.  Physiatrist is providing close team supervision and 24 hour management of active medical problems listed below.  Physiatrist and rehab team continue to assess barriers to discharge/monitor patient progress toward functional and medical goals  Care Tool:  Bathing    Body parts bathed by patient: Right arm, Left arm, Chest, Abdomen, Front perineal area, Buttocks, Right upper leg, Left upper leg, Right lower leg, Left lower leg, Face         Bathing assist Assist Level: Contact Guard/Touching  assist     Upper Body Dressing/Undressing Upper body dressing Upper body dressing/undressing activity did not occur (including orthotics): N/A What is the patient wearing?: Pull over shirt    Upper body assist Assist Level: Supervision/Verbal cueing    Lower Body Dressing/Undressing Lower body dressing    Lower body dressing activity did not occur: N/A What is the patient wearing?: Underwear/pull up, Pants     Lower body assist Assist for lower body dressing: Minimal Assistance - Patient > 75%     Toileting Toileting Toileting Activity did not occur (Clothing management and hygiene only): N/A (no void or bm)  Toileting assist Assist for toileting: Contact Guard/Touching assist     Transfers Chair/bed transfer  Transfers assist     Chair/bed transfer assist level: Contact Guard/Touching assist     Locomotion Ambulation   Ambulation assist      Assist level: Minimal Assistance - Patient > 75% Assistive device: Hand held assist Max distance: 150'   Walk 10 feet activity   Assist     Assist level: Minimal Assistance - Patient > 75% Assistive device: Hand held assist   Walk 50 feet activity   Assist    Assist level: Minimal Assistance - Patient > 75% Assistive device: Hand held assist    Walk 150 feet activity   Assist Walk 150 feet activity did not occur: Safety/medical concerns  Assist level: Minimal Assistance - Patient > 75% Assistive device: Hand held assist    Walk 10 feet on uneven surface  activity   Assist Walk 10 feet on uneven surfaces activity  did not occur: Safety/medical concerns         Wheelchair     Assist   Type of Wheelchair: Manual    Wheelchair assist level: Minimal Assistance - Patient > 75% Max wheelchair distance: 30 ft    Wheelchair 50 feet with 2 turns activity    Assist    Wheelchair 50 feet with 2 turns activity did not occur: Safety/medical concerns       Wheelchair 150 feet activity      Assist Wheelchair 150 feet activity did not occur: Safety/medical concerns          Medical Problem List and Plan: 1.Decreased functional mobility with cognitive deficitssecondary to bilateral medial frontal/temporalsubarachnoid hemorrhage from aneurysmal bleed. Pt isstatus post basilar tip aneurysm coiling  Cont CIR 2. DVT Prophylaxis/Anticoagulation: SCDs 3. Pain Management:Ultram DC'd, Tylenol as needed 4. Mood:Provide emotional support 5. Neuropsych: This patientisnot capable of making decisions on herown behalf. 6. Skin/Wound Care:Routine skin checks 7. Fluids/Electrolytes/Nutrition:Routine in and outs  BMP within normal limits on 11/4 8.Seizure prophylaxis. Keppra 500 mg twice daily 9.Urinary retention.   Improving  Urecholine 3 times daily, Flomax 0.4 mg daily.   Foley DC'd on 11/4  ?PVRs borderline 10.Diarrhea. Improved. Continue Imodium as needed as well as Florastor -encourage PO 11.Tobacco abuse. Provide counseling 12.Pseudomonas UTI. Completedcourse of Cipro. 13.  Hypoalbuminemia  Supplement initiated on 11/4 14.  Hypertension  Lisinopril 2.5 started on 11/5  LOS: 4 days A FACE TO FACE EVALUATION WAS PERFORMED   Lorie Phenix 12/30/2017, 9:11 AM

## 2017-12-30 NOTE — Progress Notes (Signed)
Speech Language Pathology Daily Session Note  Patient Details  Name: Sheryl James MRN: [id number] Date of Birth: 10-Jul-1955  Today's Date: 12/30/2017 SLP Individual Time: 1330-1430 SLP Individual Time Calculation (min): 60 min  Short Term Goals: Week 1: SLP Short Term Goal 1 (Week 1): Pt will demonstrate sustained attention to functional task for 2 minutes with min A verbal cues for redirection., SLP Short Term Goal 2 (Week 1): Pt will complete basic, functional tasks with Mod A verbal cues for functional problem solving. SLP Short Term Goal 3 (Week 1): Pt will initate verbal and functional response within 7 seconds with min A verbal cues.  SLP Short Term Goal 4 (Week 1): Pt will identify 2 physical and 2 cognitive deficits with mod A verbal and contextual cues. SLP Short Term Goal 5 (Week 1): Pt will express wants/needs at phrase level mod A verbal cues to increase verbal output. SLP Short Term Goal 6 (Week 1): Pt will utilize external memory aids to recall new, daily information with mod A verbal and questions cues.  Skilled Therapeutic Interventions: Skilled treatment session focused on cognitive goals. SLP facilitated session by initiating of a memory notebook to maximize recall and carryover of daily information. Patient required total A for recall of events from previous therapy sessions and recorded information in notebook with Max A verbal cues to self-monitor and correct errors. Patient also required total A for orientation to time but was independently oriented to palce. SLP also facilitated session by providing extra time and Max A verbal cues for patient to utilize notebook to recall daily information. Husband provided education on memory notebook and use and verbalized understanding. Patient left upright in wheelchair with family present. Continue with current plan of care.      Pain No/Denies Pain   Therapy/Group: Individual Therapy  Annissa Andreoni, [place] 12/30/2017, 3:48 PM

## 2017-12-30 NOTE — Progress Notes (Signed)
Occupational Therapy Session Note  Patient Details  Name: Sheryl James MRN: [id number] Date of Birth: 16-Aug-1955  Today's Date: 12/30/2017 OT Individual Time: 1004-1101 OT Individual Time Calculation (min): 57 min    Short Term Goals: Week 1:  OT Short Term Goal 1 (Week 1): Pt will complete sit<stand during dressing with supervision assist and LRAD OT Short Term Goal 2 (Week 1): Pt will complete LB dressing with CGA  OT Short Term Goal 3 (Week 1): Pt will initiate completion of 1 grooming task during ADL session   Skilled Therapeutic Interventions/Progress Updates:    Pt completed functional mobility to the ADL apartment with min assist.  Once pt reached the ADL apartment, had her sit on the couch and work on orientation.  Pt not oriented to day of week or month.  When told the day of the week, she was unable to recall after 5 and 15 min delay.  She was able to then work on making up the bed in the ADL apartment.  Mod assist for balance when making up the bed with mod instructional cueing to slow down and stand all the way up before ambulating around the bed.  Pt was next given verbally 3 items to remember and retrieve from the pantry.  After approximately two minute delay, she was only able to recall 2/3 items.  She was then given a list of five items to remember and retrieve from the pantry.  She was unable to recall any of the items without looking back at the list again after 2-3 minute delay.  She needed overall max assist for recall of the items overall.  Once task was completed she could recall 3/4 items with max questioning cueing.  Finished session with ambulation back to the room and pt left sitting in the wheelchair with call button and phone in reach.  Safety alarm in place and spouse in the room as well.    Therapy Documentation Precautions:  Precautions Precautions: Fall Restrictions Weight Bearing Restrictions: No  Pain: Pain Assessment Pain Scale: Faces Pain Score: 0-No  pain  Therapy/Group: Individual Therapy  Qaadir Kent OTR/L 12/30/2017, 12:40 PM

## 2017-12-31 ENCOUNTER — Inpatient Hospital Stay (HOSPITAL_COMMUNITY): Payer: 59 | Admitting: Speech Pathology

## 2017-12-31 ENCOUNTER — Inpatient Hospital Stay (HOSPITAL_COMMUNITY): Payer: 59 | Admitting: Occupational Therapy

## 2017-12-31 ENCOUNTER — Inpatient Hospital Stay (HOSPITAL_COMMUNITY): Payer: 59 | Admitting: Physical Therapy

## 2017-12-31 NOTE — Progress Notes (Signed)
Occupational Therapy Session Note  Patient Details  Name: Sheryl James MRN: [id number] Date of Birth: Jun 19, 1955  Today's Date: 12/31/2017 OT Individual Time: 3500-9381 OT Individual Time Calculation (min): 44 min    Short Term Goals: Week 1:  OT Short Term Goal 1 (Week 1): Pt will complete sit<stand during dressing with supervision assist and LRAD OT Short Term Goal 2 (Week 1): Pt will complete LB dressing with CGA  OT Short Term Goal 3 (Week 1): Pt will initiate completion of 1 grooming task during ADL session   Skilled Therapeutic Interventions/Progress Updates:    Pt ambulated down to the therapy gym with min assist hand held.  Once in the therapy gym had pt work on peg design problem solving task in sitting and in standing.  Pt needed min questioning cueing to complete first puzzle secondary to only having one error after completing self review on her own to locate errors.  For the second puzzle, had her work in standing on picking up pieces from lower container and placing in grid to match picture.  Two losses of balance noted with reaching.  Max instructional cueing for sequencing and recognizing errors and correcting for second puzzle.  Finished session with functional mobility back to the room with min assist.  Pt left in wheelchair with safety alarm belt in place and tele sitter.    Therapy Documentation Precautions:   Precautions Precautions: Fall Restrictions Weight Bearing Restrictions: No  Pain: Pain Assessment Pain Scale: 0-10 Pain Score: 0-No pain  Therapy/Group: Individual Therapy  Sheryl James OTR/L 12/31/2017, 2:53 PM

## 2017-12-31 NOTE — Progress Notes (Signed)
Physical Therapy Session Note  Patient Details  Name: Sheryl James MRN: [id number] Date of Birth: October 22, 1955  Today's Date: 12/31/2017 PT Individual Time: 0802-0900 PT Individual Time Calculation (min): 58 min   Short Term Goals: Week 1:  PT Short Term Goal 1 (Week 1): Pt will maintain dynamic sitting balance during mobility activities with min assist.  PT Short Term Goal 2 (Week 1): Pt will perform stand pivot transfer from bed to [facility] with min assist. PT Short Term Goal 3 (Week 1): Pt will ambulate up to 150' with LRAD with CGA assist.  PT Short Term Goal 4 (Week 1): Pt will ambulate up and down 3 stairs (6") with two handrails with min assist.   Skilled Therapeutic Interventions/Progress Updates:   Pt received sitting in WC and agreeable to PT  Gait training with RW CGA progressing to Supervision assist x 249f. Min cues for forward gaze and increased stride length.  PT instructed pt in 5xSTS: 37 seconds(>15 sec indicates increased fall risk) and Tug: 50sec, 55 sec, And 60 sec. (>15 sec indicates increased fall risk)   Standing balance instructed by PT on red wedge wihle engaged in cognitive task of pipe tree. 1st bout with entire foot on wedge and max assist to prevent posterior LOB. 2nd bout only forefoot on wedge and CGA assist to correct posterior LOB. Cues for use for ankle strategy to prevent LOB.   Stair management training x 8 with CGA overall and min cues for step to gait pattern. Pt noted to self select step over step ascent and step to descent.   Dynamic gait training. To weave through cones  X 10 with RW and supervision assist with cues for AD control.   Gait training back to room without AD x 2011fand supervision-min assist with fatigue. Pt noted to have inconsistent step length and cadence resulting in multiple near LOB.   [facility] to [facility] and all needs met.         Therapy Documentation Precautions:   Precautions Precautions: Fall Restrictions Weight Bearing Restrictions: No Vital Signs: Therapy Vitals Temp: 97.8 F (36.6 C) Pulse Rate: 80 Resp: 17 BP: (!) 141/64 Patient Position (if appropriate): Lying Oxygen Therapy SpO2: 96 % O2 Device: Room Air Pain:   denies  Therapy/Group: Individual Therapy  AuLorie Phenix1/07/2017, 9:03 AM

## 2017-12-31 NOTE — Plan of Care (Signed)
  Problem: Consults Goal: RH STROKE PATIENT EDUCATION Description See Patient Education module for education specifics  Outcome: Progressing Goal: Nutrition Consult-if indicated Outcome: Progressing Goal: Diabetes Guidelines if Diabetic/Glucose > 140 Description If diabetic or lab glucose is > 140 mg/dl - Initiate Diabetes/Hyperglycemia Guidelines & Document Interventions  Outcome: Progressing   Problem: RH BOWEL ELIMINATION Goal: RH STG MANAGE BOWEL WITH ASSISTANCE Description STG Manage Bowel with Assistance.Min  Outcome: Progressing Goal: RH STG MANAGE BOWEL W/MEDICATION W/ASSISTANCE Description STG Manage Bowel with Medication with Assistance. min  Outcome: Progressing   Problem: RH BLADDER ELIMINATION Goal: RH STG MANAGE BLADDER WITH ASSISTANCE Description STG Manage Bladder With min assistance  Outcome: Progressing Goal: RH STG MANAGE BLADDER WITH MEDICATION WITH ASSISTANCE Description STG Manage Bladder With Medication With min Assistance.  Outcome: Progressing   Problem: RH SKIN INTEGRITY Goal: RH STG SKIN FREE OF INFECTION/BREAKDOWN Description Free of infection and breakdown with min assist  Outcome: Progressing Goal: RH STG MAINTAIN SKIN INTEGRITY WITH ASSISTANCE Description STG Maintain Skin Integrity With Assistance. Outcome: Progressing Goal: RH STG ABLE TO PERFORM INCISION/WOUND CARE W/ASSISTANCE Description STG Able To Perform Incision/Wound Care With Assistance. min  Outcome: Progressing   Problem: RH SAFETY Goal: RH STG DECREASED RISK OF FALL WITH ASSISTANCE Description STG Decreased Risk of Fall With min Assistance.  Outcome: Progressing   Problem: RH COGNITION-NURSING Goal: RH STG USES MEMORY AIDS/STRATEGIES W/ASSIST TO PROBLEM SOLVE Description STG Uses Memory Aids/Strategies With Assistance to Problem Solve. min  Outcome: Progressing Goal: RH STG ANTICIPATES NEEDS/CALLS FOR ASSIST W/ASSIST/CUES Description STG Anticipates Needs/Calls  for Assist With Assistance/Cues. min  Outcome: Progressing   Problem: RH PAIN MANAGEMENT Goal: RH STG PAIN MANAGED AT OR BELOW PT'S PAIN GOAL Description Less than 3  Outcome: Progressing   Problem: RH KNOWLEDGE DEFICIT Goal: RH STG INCREASE KNOWLEDGE OF HYPERTENSION Description Patient or family will be able to verbalize management of blood pressure including monitoring, target bp range and medications  Outcome: Progressing Goal: RH STG INCREASE KNOWLEDGE OF DYSPHAGIA/FLUID INTAKE Outcome: Progressing   Problem: RH Vision Goal: RH LTG Vision (Specify) Outcome: Progressing   Problem: RH Pre-functional/Other (Specify) Goal: RH LTG Pre-functional (Specify) Outcome: Progressing Goal: RH LTG Interdisciplinary (Specify) 1 Description RH LTG Interdisciplinary (Specify)1 Outcome: Progressing Goal: RH LTG Interdisciplinary (Specify) 2 Description RH LTG Interdisciplinary (Specify) 2  Outcome: Progressing

## 2017-12-31 NOTE — Progress Notes (Signed)
Occupational Therapy Session Note  Patient Details  Name: Sheryl James MRN: [id number] Date of Birth: 1955/08/02  Today's Date: 12/31/2017 OT Individual Time: 0930-1015 OT Individual Time Calculation (min): 45 min    Short Term Goals: Week 1:  OT Short Term Goal 1 (Week 1): Pt will complete sit<stand during dressing with supervision assist and LRAD OT Short Term Goal 2 (Week 1): Pt will complete LB dressing with CGA  OT Short Term Goal 3 (Week 1): Pt will initiate completion of 1 grooming task during ADL session     Skilled Therapeutic Interventions/Progress Updates:    pt received in w/c with spouse in room with her. Pt agreeable to a shower.  Asked pt to find the clothing she wanted to wear in her luggage.  Pt ambulated to dresser and luggage with CGA.  She did need mod cues to sequence and choose just one item of each as she kept picking up 2 sports bras, or moving clean socks to dirty laudry bag.  Pt performing task in standing with 3 LOB to her R.  Had pt sit to complete tasks. Discussed with her and her spouse that dual task of standing and focusing on organizing is very challenging right now and to try to do one task at a time.  Pt ambulated into bathroom and shower with CGA and was then able to shower seated with occasional standing with close S.  Returned to EOB to dress with close S and verbal cues to sit to don clothing over feet.   Pt then ambulated to sink and stood at sink to brush teeth with close S/CGA.  Returned to w/c with chair alarms turned on.  Pt wrote in her memory notebook.    Therapy Documentation Precautions:  Precautions Precautions: Fall Restrictions Weight Bearing Restrictions: No  Pain: Pain Assessment Pain Scale: 0-10 Pain Score: 0-No pain Faces Pain Scale: No hurt    Therapy/Group: Individual Therapy  Shereena Berquist 12/31/2017, 12:39 PM

## 2017-12-31 NOTE — Progress Notes (Signed)
[place] PHYSICAL MEDICINE & REHABILITATION PROGRESS NOTE  Subjective/Complaints: Patient seen sitting up in her chair this morning.  She states she slept well overnight.  Husband at bedside.  ROS: Denies CP, SOB, nausea, vomiting, diarrhea.  Objective: Vital Signs: Blood pressure (!) 141/64, pulse 80, temperature 97.8 F (36.6 C), resp. rate 17, SpO2 96 %. No results found. Recent Labs    12/29/17 0543  WBC 7.7  HGB 12.1  HCT 38.7  PLT 333   Recent Labs    12/29/17 0543  NA 141  K 3.9  CL 107  CO2 25  GLUCOSE 88  BUN 11  CREATININE 0.45  CALCIUM 9.0     Physical Exam: BP (!) 141/64 (BP Location: Left Arm)   Pulse 80   Temp 97.8 F (36.6 C)   Resp 17   SpO2 96%  Constitutional:NAD.  Vital signs reviewed. HENT:Normocephalic.  Staples intact Eyes:EOMI.  No discharge. Cardiovascular:RRR.  No JVD.   Respiratory:Effort.  Clear. GI: She exhibitsno distension.  Bowel sounds normal. Musculoskeletal:No edema or tenderness in extremities Neurological: She isalert and oriented x2. Delay in processing.  Motor: Grossly 4/5 throughout, unchanged Skin: She isnot diaphoretic.  See above Psychiatric:Flat  Assessment/Plan: 1. Functional deficits secondary to bilateral SAH status post ventriculostomy which require 3+ hours per day of interdisciplinary therapy in a comprehensive inpatient rehab setting.  Physiatrist is providing close team supervision and 24 hour management of active medical problems listed below.  Physiatrist and rehab team continue to assess barriers to discharge/monitor patient progress toward functional and medical goals  Care Tool:  Bathing    Body parts bathed by patient: Right arm, Left arm, Chest, Abdomen, Front perineal area, Buttocks, Right upper leg, Left upper leg, Right lower leg, Left lower leg, Face         Bathing assist Assist Level: Contact Guard/Touching assist     Upper Body Dressing/Undressing Upper body  dressing Upper body dressing/undressing activity did not occur (including orthotics): N/A What is the patient wearing?: Pull over shirt    Upper body assist Assist Level: Set up assist    Lower Body Dressing/Undressing Lower body dressing    Lower body dressing activity did not occur: N/A What is the patient wearing?: Underwear/pull up, Pants     Lower body assist Assist for lower body dressing: Contact Guard/Touching assist     Toileting Toileting Toileting Activity did not occur (Clothing management and hygiene only): N/A (no void or bm)  Toileting assist Assist for toileting: Contact Guard/Touching assist     Transfers Chair/bed transfer  Transfers assist     Chair/bed transfer assist level: Contact Guard/Touching assist     Locomotion Ambulation   Ambulation assist      Assist level: Minimal Assistance - Patient > 75% Assistive device: Hand held assist Max distance: 156ft   Walk 10 feet activity   Assist     Assist level: Minimal Assistance - Patient > 75% Assistive device: Hand held assist   Walk 50 feet activity   Assist    Assist level: Minimal Assistance - Patient > 75% Assistive device: Hand held assist    Walk 150 feet activity   Assist Walk 150 feet activity did not occur: Safety/medical concerns  Assist level: Minimal Assistance - Patient > 75% Assistive device: Hand held assist    Walk 10 feet on uneven surface  activity   Assist Walk 10 feet on uneven surfaces activity did not occur: Safety/medical concerns  Wheelchair     Assist   Type of Wheelchair: Agricultural engineer assist level: Minimal Assistance - Patient > 75% Max wheelchair distance: 30 ft    Wheelchair 50 feet with 2 turns activity    Assist    Wheelchair 50 feet with 2 turns activity did not occur: Safety/medical concerns       Wheelchair 150 feet activity     Assist Wheelchair 150 feet activity did not occur: Safety/medical  concerns          Medical Problem List and Plan: 1.Decreased functional mobility with cognitive deficitssecondary to bilateral medial frontal/temporalsubarachnoid hemorrhage from aneurysmal bleed. Pt isstatus post basilar tip aneurysm coiling  Cont CIR 2. DVT Prophylaxis/Anticoagulation: SCDs 3. Pain Management:Ultram DC'd, Tylenol as needed 4. Mood:Provide emotional support 5. Neuropsych: This patientisnot capable of making decisions on herown behalf. 6. Skin/Wound Care:Routine skin checks 7. Fluids/Electrolytes/Nutrition:Routine in and outs  BMP within normal limits on 11/4 8.Seizure prophylaxis. Keppra 500 mg twice daily 9.Urinary retention.   Improving  Urecholine 3 times daily, Flomax 0.4 mg daily.   Foley DC'd on 11/4  PVR?  Labile, however appears to be clinically improving 10.Diarrhea. Improved. Continue Imodium as needed as well as Florastor -encourage PO 11.Tobacco abuse. Provide counseling 12.Pseudomonas UTI. Completedcourse of Cipro. 13.  Hypoalbuminemia  Supplement initiated on 11/4 14.  Hypertension  Lisinopril 2.5 started on 11/5  Remains elevated on 11/6  LOS: 5 days A FACE TO FACE EVALUATION WAS PERFORMED  Thatiana Renbarger Lorie Phenix 12/31/2017, 9:47 AM

## 2017-12-31 NOTE — Progress Notes (Signed)
Speech Language Pathology Daily Session Note  Patient Details  Name: Sheryl James MRN: [id number] Date of Birth: [birth year]  Today's Date: 12/31/2017 SLP Individual Time: 1119-1200 SLP Individual Time Calculation (min): 41 min  Short Term Goals: Week 1: SLP Short Term Goal 1 (Week 1): Pt will demonstrate sustained attention to functional task for 2 minutes with min A verbal cues for redirection., SLP Short Term Goal 2 (Week 1): Pt will complete basic, functional tasks with Mod A verbal cues for functional problem solving. SLP Short Term Goal 3 (Week 1): Pt will initate verbal and functional response within 7 seconds with min A verbal cues.  SLP Short Term Goal 4 (Week 1): Pt will identify 2 physical and 2 cognitive deficits with mod A verbal and contextual cues. SLP Short Term Goal 5 (Week 1): Pt will express wants/needs at phrase level mod A verbal cues to increase verbal output. SLP Short Term Goal 6 (Week 1): Pt will utilize external memory aids to recall new, daily information with mod A verbal and questions cues.  Skilled Therapeutic Interventions:  Pt was seen for skilled ST targeting cognitive goals.  Pt was received sitting upright in wheelchair.  Pt needed mod-max contextual cues for recall of rationale behind ST interventions.  With the use of her memory notebook, pt could recall specific activities from morning therapy sessions with mod assist question cues.  Min-mod cues needed for safety when exiting the room as pt impulsively attempted to remove seat belt alarm.  SLP facilitated the session with a novel card game targeting problem solving, memory, and attention goals.  Pt initially needed max assist cues to plan and execute a problem solving strategy due to decreased attention to task and slowed processing speech which lead to decreased working memory of task rules and procedures.  As task progressed, therapist was able to fade cues to min-mod assist with subjectively decreased  response latency.  Pt recorded information regarding therapy session into memory notebook with min organizational cues.  Pt was returned to room and left in wheelchair with seat belt alarm set, and all needs within reach.  Continue per current plan of care.    Pain Pain Assessment Pain Scale: 0-10 Pain Score: 0-No pain Faces Pain Scale: No hurt  Therapy/Group: Individual Therapy  Jasreet Dickie, Selinda Orion 12/31/2017, 11:51 AM

## 2017-12-31 NOTE — Care Management (Signed)
[facility] Individual Statement of Services  Patient Name:  Sheryl James  Date:  12/31/2017  Welcome to the [facility].  Our goal is to provide you with an individualized program based on your diagnosis and situation, designed to meet your specific needs.  With this comprehensive rehabilitation program, you will be expected to participate in at least 3 hours of rehabilitation therapies Monday-Friday, with modified therapy programming on the weekends.  Your rehabilitation program will include the following services:  Physical Therapy (PT), Occupational Therapy (OT), Speech Therapy (ST), 24 hour per day rehabilitation nursing, Therapeutic Recreaction (TR), Neuropsychology, Case Management (Social Worker), Rehabilitation Medicine, Nutrition Services and Pharmacy Services  Weekly team conferences will be held on Wednesdays to discuss your progress.  Your Social Worker will talk with you frequently to get your input and to update you on team discussions.  Team conferences with you and your family in attendance may also be held.  Expected length of stay: 14-16 days   Overall anticipated outcome: supervision  Depending on your progress and recovery, your program may change. Your Social Worker will coordinate services and will keep you informed of any changes. Your Social Worker's name and contact numbers are listed  below.  The following services may also be recommended but are not provided by the [facility] will be made to provide these services after discharge if needed.  Arrangements include referral to agencies that provide these services.  Your insurance has been verified to be:  [facility] Your primary doctor is:    Pertinent information will be shared with your doctor and your insurance company.  Social Worker:  [place], [place] or ([phone]   Information discussed with and copy given to patient by: Lennart Pall, 12/31/2017, 2:27 PM

## 2018-01-01 ENCOUNTER — Inpatient Hospital Stay (HOSPITAL_COMMUNITY): Payer: 59 | Admitting: Occupational Therapy

## 2018-01-01 ENCOUNTER — Inpatient Hospital Stay (HOSPITAL_COMMUNITY): Payer: 59 | Admitting: Physical Therapy

## 2018-01-01 ENCOUNTER — Inpatient Hospital Stay (HOSPITAL_COMMUNITY): Payer: 59 | Admitting: Speech Pathology

## 2018-01-01 DIAGNOSIS — I69319 Unspecified symptoms and signs involving cognitive functions following cerebral infarction: Secondary | ICD-10-CM

## 2018-01-01 NOTE — Progress Notes (Signed)
Physical Therapy Session Note  Patient Details  Name: Sheryl James MRN: [id number] Date of Birth: 07-May-1955  Today's Date: 01/01/2018 PT Individual Time: 0805-0900 PT Individual Time Calculation (min): 55 min   Short Term Goals: Week 1:  PT Short Term Goal 1 (Week 1): Pt will maintain dynamic sitting balance during mobility activities with min assist.  PT Short Term Goal 2 (Week 1): Pt will perform stand pivot transfer from bed to [facility] with min assist. PT Short Term Goal 3 (Week 1): Pt will ambulate up to 150' with LRAD with CGA assist.  PT Short Term Goal 4 (Week 1): Pt will ambulate up and down 3 stairs (6") with two handrails with min assist.   Skilled Therapeutic Interventions/Progress Updates:   Pt received supine in bed and agreeable to PT. Supine>sit transfer with supervision assist and min cues for decreased use of bed rails.   Sit<>stand transfers with CGA throughout treatment. Min cues for improved WB through RW once in standing to prevent posterior LOB.   Gait training with RW 2 x 275f with CGA progressing to supervision assist from PT.  Variable gati traning in parallel bars forward/backward, side stepping moderate multimodal cues to prevent posterior lean and use of ankle strategy to correct LOB. PT instructed pt in calf stretch in parallel bars as well as standing on blue wedge 4 x 1 min BUE. Mild improvement in anterior weight shifting in stance following calf stretch. Gait without AD 2 x 1026fwith with min assist from PT for safety. Moderate cues for attention to task, improved step cadence, and step length.   [facility] to room and [facility] reach and all needs met.         Therapy Documentation Precautions:  Precautions Precautions: Fall Precaution Comments:   Restrictions Weight Bearing Restrictions: No   Pain: Pain Assessment Pain Scale: Faces Pain Score: 0-No pain   Therapy/Group: Individual Therapy  AuLorie Phenix1/08/2017, 4:37 PM

## 2018-01-01 NOTE — Progress Notes (Signed)
Occupational Therapy Session Note  Patient Details  Name: Sheryl James MRN: [id number] Date of Birth: 04/10/1955  Today's Date: 01/01/2018 OT Individual Time: 7829-5621 OT Individual Time Calculation (min): 31 min    Short Term Goals: Week 1:  OT Short Term Goal 1 (Week 1): Pt will complete sit<stand during dressing with supervision assist and LRAD OT Short Term Goal 2 (Week 1): Pt will complete LB dressing with CGA  OT Short Term Goal 3 (Week 1): Pt will initiate completion of 1 grooming task during ADL session   Skilled Therapeutic Interventions/Progress Updates:    Pt completed functional mobility to and from the dayroom during session with min hand held assist.  Frequent LOB noted during mobilization, mostly to the right side.  Once in the dayroom had pt work on standing balance, cognitive processing, and memory through use of the [employer].  She was able to stand and toss bean bags to target with min assist and then retrieve them from the board/floor with constant min assist.  When asked to keep a running total of her points she needed overall max questioning cueing to remember.  She could state the number of points she obtained with each set of 4 tosses but could not remember her total number of points prior, in order to add them to it.  Pt stood for entire time of tossing bean bags as well as ambulation back to the room, over 20 mins without resting.  Pt left in chair with family and friends present.  Had pt work with spouse on filling out memory book at end of session.       Therapy Documentation Precautions:  Precautions Precautions: Fall Precaution Comments:   Restrictions Weight Bearing Restrictions: No  Pain: Pain Assessment Pain Scale: Faces Pain Score: 0-No pain   Therapy/Group: Individual Therapy  Phillip Maffei OTR/L 01/01/2018, 4:22 PM

## 2018-01-01 NOTE — Progress Notes (Signed)
PHYSICAL MEDICINE & REHABILITATION PROGRESS NOTE  Subjective/Complaints: Patient seen laying in bed this morning.  Husband at bedside.  She states she slept well overnight.  ROS: Denies CP, SOB, nausea, vomiting, diarrhea.  Objective: Vital Signs: Blood pressure (!) 135/59, pulse 85, temperature 98.1 F (36.7 C), temperature source Oral, resp. rate 17, SpO2 98 %. No results found. No results for input(s): WBC, HGB, HCT, PLT in the last 72 hours. No results for input(s): NA, K, CL, CO2, GLUCOSE, BUN, CREATININE, CALCIUM in the last 72 hours.   Physical Exam: BP (!) 135/59   Pulse 85   Temp 98.1 F (36.7 C) (Oral)   Resp 17   SpO2 98%  Constitutional:NAD.  Vital signs reviewed. HENT:Normocephalic.  Atraumatic. Eyes:EOMI.  No discharge. Cardiovascular:RRR.  No JVD.   Respiratory:Effort.  Clear. GI: She exhibitsno distension.  Bowel sounds normal. Musculoskeletal:No edema or tenderness in extremities Neurological: She isalert and oriented x 3, except for date Delay in processing.  Motor: Grossly 4/5 throughout, stable Skin: She isnot diaphoretic.  See above Psychiatric: Flat  Assessment/Plan: 1. Functional deficits secondary to bilateral SAH status post ventriculostomy which require 3+ hours per day of interdisciplinary therapy in a comprehensive inpatient rehab setting.  Physiatrist is providing close team supervision and 24 hour management of active medical problems listed below.  Physiatrist and rehab team continue to assess barriers to discharge/monitor patient progress toward functional and medical goals  Care Tool:  Bathing    Body parts bathed by patient: Right arm, Left arm, Chest, Abdomen, Front perineal area, Buttocks, Right upper leg, Left upper leg, Right lower leg, Left lower leg, Face         Bathing assist Assist Level: Supervision/Verbal cueing     Upper Body Dressing/Undressing Upper body dressing Upper body dressing/undressing  activity did not occur (including orthotics): N/A What is the patient wearing?: Pull over shirt, Bra    Upper body assist Assist Level: Supervision/Verbal cueing    Lower Body Dressing/Undressing Lower body dressing    Lower body dressing activity did not occur: N/A What is the patient wearing?: Underwear/pull up, Pants     Lower body assist Assist for lower body dressing: Supervision/Verbal cueing     Toileting Toileting Toileting Activity did not occur (Clothing management and hygiene only): N/A (no void or bm)  Toileting assist Assist for toileting: Contact Guard/Touching assist     Transfers Chair/bed transfer  Transfers assist     Chair/bed transfer assist level: Contact Guard/Touching assist     Locomotion Ambulation   Ambulation assist      Assist level: Minimal Assistance - Patient > 75% Assistive device: Hand held assist Max distance: 150'   Walk 10 feet activity   Assist     Assist level: Supervision/Verbal cueing Assistive device: Hand held assist   Walk 50 feet activity   Assist    Assist level: Supervision/Verbal cueing Assistive device: Walker-rolling    Walk 150 feet activity   Assist Walk 150 feet activity did not occur: Safety/medical concerns  Assist level: Supervision/Verbal cueing Assistive device: Walker-rolling    Walk 10 feet on uneven surface  activity   Assist Walk 10 feet on uneven surfaces activity did not occur: Safety/medical concerns     Assistive device: Aeronautical engineer   Type of Wheelchair: Manual    Wheelchair assist level: Minimal Assistance - Patient > 75% Max wheelchair distance: 30 ft    Wheelchair 50 feet with 2  turns activity    Assist    Wheelchair 50 feet with 2 turns activity did not occur: Safety/medical concerns       Wheelchair 150 feet activity     Assist Wheelchair 150 feet activity did not occur: Safety/medical concerns           Medical Problem List and Plan: 1.Decreased functional mobility with cognitive deficitssecondary to bilateral medial frontal/temporalsubarachnoid hemorrhage from aneurysmal bleed. Pt isstatus post basilar tip aneurysm coiling  Cont CIR  Continues to be limited by cognition 2. DVT Prophylaxis/Anticoagulation: SCDs 3. Pain Management:Ultram DC'd, Tylenol as needed 4. Mood:Provide emotional support 5. Neuropsych: This patientisnot capable of making decisions on herown behalf. 6. Skin/Wound Care:Routine skin checks 7. Fluids/Electrolytes/Nutrition:Routine in and outs  BMP within normal limits on 11/4 8.Seizure prophylaxis. Keppra 500 mg twice daily 9.Urinary retention.   ? Resolved  Urecholine 3 times daily, Flomax 0.4 mg daily.   Foley DC'd on 11/4 10.Diarrhea. Improved. Continue Imodium as needed as well as Florastor -encourage PO 11.Tobacco abuse. Provide counseling 12.Pseudomonas UTI. Completedcourse of Cipro. 13.  Hypoalbuminemia  Supplement initiated on 11/4 14.  Hypertension  Lisinopril 2.5 started on 11/5  Improved on 11/7  LOS: 6 days A FACE TO FACE EVALUATION WAS PERFORMED  Sheryl James Sheryl James 01/01/2018, 8:47 AM

## 2018-01-01 NOTE — Progress Notes (Signed)
Speech Language Pathology Daily Session Note  Patient Details  Name: Sheryl James MRN: [id number] Date of Birth: 1956-02-20  Today's Date: 01/01/2018 SLP Individual Time: 1020-1115 SLP Individual Time Calculation (min): 55 min  Short Term Goals: Week 1: SLP Short Term Goal 1 (Week 1): Pt will demonstrate sustained attention to functional task for 2 minutes with min A verbal cues for redirection., SLP Short Term Goal 2 (Week 1): Pt will complete basic, functional tasks with Mod A verbal cues for functional problem solving. SLP Short Term Goal 3 (Week 1): Pt will initate verbal and functional response within 7 seconds with min A verbal cues.  SLP Short Term Goal 4 (Week 1): Pt will identify 2 physical and 2 cognitive deficits with mod A verbal and contextual cues. SLP Short Term Goal 5 (Week 1): Pt will express wants/needs at phrase level mod A verbal cues to increase verbal output. SLP Short Term Goal 6 (Week 1): Pt will utilize external memory aids to recall new, daily information with mod A verbal and questions cues.  Skilled Therapeutic Interventions:  Pt was seen for skilled ST targeting cognitive goals.  Pt was received sitting upright in recliner with family at bedside (husband and sister in law).  Pt has been recording information into her memory book and was able to recall at least 2 specific details from the morning with supervision question cues.  SLP facilitated the session with semi-complex money management tasks.  Pt was able to fill out a check for 100% accuracy with mod I but needed min verbal cues to balance a check book accurately due to decreased selective attention to task and decreased task organization.  SLP also needed min verbal cues to complete functional math calculations related to time lapsing, recipe management, and money management for the abovementioned deficits.  Discussed strategies with pt and pt's husband to maximize attention to task and task organization,  emphasizing breaking tasks into smaller, more manageable "chunks" to avoid cognitive fatigue.  Pt was returned to room and left in wheelchair with family at bedside.  Continue per current plan of care.    Pain Pain Assessment Pain Scale: 0-10 Pain Score: 0-No pain  Therapy/Group: Individual Therapy  Jereline Ticer, Selinda Orion 01/01/2018, 11:40 AM

## 2018-01-01 NOTE — Progress Notes (Signed)
Occupational Therapy Session Note  Patient Details  Name: Sheryl James MRN: [id number] Date of Birth: [birth year]  Today's Date: 01/01/2018 OT Individual Time: 1115-1200 OT Individual Time Calculation (min): 45 min    Short Term Goals: Week 1:  OT Short Term Goal 1 (Week 1): Pt will complete sit<stand during dressing with supervision assist and LRAD OT Short Term Goal 2 (Week 1): Pt will complete LB dressing with CGA  OT Short Term Goal 3 (Week 1): Pt will initiate completion of 1 grooming task during ADL session    Skilled Therapeutic Interventions/Progress Updates:    Pt seen this session for ADL training with a focus on sequencing and problem solving.  Pt was able to ambulate in room without AD and CGA.  Pt had one LOB early on when she quickly turned around to look at her sister in law who was asking her a question.  For the rest of the session, family was not present and pt had no LOB.  She is able to maintain balance in a quiet environment with minimal distractions. Pt continues to need cues with problem solving/planning which clothing to use as she tends to take out multiple pieces or too many pairs of underwear.  Pt undressed in room and then walked into shower and dressed in room at EOB. No cuing needed today to sit down to doff/don clothing over feet. Pt returned to recliner.   She wrote down what she accomplished this session.  Family returned to room and tele sitter on.  Therapy Documentation Precautions:  Precautions Precautions: Fall Restrictions Weight Bearing Restrictions: No    Vital Signs: Therapy Vitals Temp: 98.1 F (36.7 C) Temp Source: Oral Pulse Rate: 85 BP: (!) 135/59 Oxygen Therapy SpO2: 98 % Pain:  no c/o pain     Therapy/Group: Individual Therapy  Victorious Cosio 01/01/2018, 8:25 AM

## 2018-01-02 ENCOUNTER — Inpatient Hospital Stay (HOSPITAL_COMMUNITY): Payer: 59

## 2018-01-02 ENCOUNTER — Inpatient Hospital Stay (HOSPITAL_COMMUNITY): Payer: 59 | Admitting: Physical Therapy

## 2018-01-02 ENCOUNTER — Inpatient Hospital Stay (HOSPITAL_COMMUNITY): Payer: 59 | Admitting: Speech Pathology

## 2018-01-02 ENCOUNTER — Inpatient Hospital Stay (HOSPITAL_COMMUNITY): Payer: 59 | Admitting: Occupational Therapy

## 2018-01-02 NOTE — Progress Notes (Signed)
Speech Language Pathology Weekly Progress and Session Note  Patient Details  Name: Sheryl James MRN: [id number] Date of Birth: 1956/01/17  Beginning of progress report period: 12/26/2017 End of progress report period: 01/02/2018   Today's Date: 01/02/2018 SLP Individual Time: 1779-3903 SLP Individual Time Calculation (min): 56 min  Short Term Goals: Week 1: SLP Short Term Goal 1 (Week 1): Pt will demonstrate sustained attention to functional task for 2 minutes with min A verbal cues for redirection., SLP Short Term Goal 1 - Progress (Week 1): Met SLP Short Term Goal 2 (Week 1): Pt will complete basic, functional tasks with Mod A verbal cues for functional problem solving. SLP Short Term Goal 2 - Progress (Week 1): Met SLP Short Term Goal 3 (Week 1): Pt will initate verbal and functional response within 7 seconds with min A verbal cues.  SLP Short Term Goal 3 - Progress (Week 1): Met SLP Short Term Goal 4 (Week 1): Pt will identify 2 physical and 2 cognitive deficits with mod A verbal and contextual cues. SLP Short Term Goal 4 - Progress (Week 1): Progressing toward goal SLP Short Term Goal 5 (Week 1): Pt will express wants/needs at phrase level mod A verbal cues to increase verbal output. SLP Short Term Goal 5 - Progress (Week 1): Met SLP Short Term Goal 6 (Week 1): Pt will utilize external memory aids to recall new, daily information with mod A verbal and questions cues. SLP Short Term Goal 6 - Progress (Week 1): Met    New Short Term Goals: Week 2: SLP Short Term Goal 1 (Week 2): STG=LTG due to remaining length of stay   Weekly Progress Updates:   Pt has made functional gains this reporting period and has met 5 out of 6 short term goals.  Pt is currently mod assist for tasks due to moderate cognitive impairment.  Pt has demonstrated improved use of memory notebook, improved sustained attention to tasks, and functional problem solving.  Pt and family education is ongoing.  Pt would  continue to benefit from skilled ST while inpatient in order to maximize functional independence and reduce burden of care prior to discharge.  Anticipate that pt will need 24/7 supervision at discharge in addition to [place] follow up at next level of care.     Intensity: Minumum of 1-2 x/day, 30 to 90 minutes Frequency: 3 to 5 out of 7 days Duration/Length of Stay: 14-20 days  Treatment/Interventions: Cognitive remediation/compensation;Cueing hierarchy;Patient/family education   Daily Session  Skilled Therapeutic Interventions: Pt was seen for skilled ST targeting cognitive goals.  SLP facilitated the session with a novel card game targeting use of memory compensatory strategies, specifically associations.  Pt needed min cues to generate word-picture associations but mod-max cues to recall them after delays of varying length.  Pt became distracted by increased environmental noise and required frequent mod-max cues for redirection to task; however, when environment was more quiet she was attentive to task with min assist cues for redirection.  Shared observations regarding distractibility with pt and pt's husband and provided skilled education regarding distraction management techniques.  Pt needed mod-max cues to record strategies and activities of therapy into her memory notebook.  Pt was left in wheelchair and handed off to her husband and sister in law who were going to take her to the craft fair.  Goals updated on this date to reflect current progress and plan of care.       General    Pain Pain Assessment  Pain Scale: 0-10 Pain Score: 0-No pain  Therapy/Group: Individual Therapy  Hassel Uphoff, Selinda Orion 01/02/2018, 10:58 AM

## 2018-01-02 NOTE — Patient Care Conference (Cosign Needed)
Inpatient RehabilitationTeam Conference and Plan of Care Update Date: 12/31/2017   Time: 2:40 PM    Patient Name: Sheryl James      Medical Record Number: [id number]  Date of Birth: [birth year] Sex: Female         Room/Bed: 4M09C/4M09C-01 Payor Info: Payor: Theme park manager / Plan: [place] / Product Type: *No Product type* /    Admitting Diagnosis: SAH  Admit Date/Time:  12/26/2017  3:16 PM Admission Comments: No comment available   Primary Diagnosis:  <principal problem not specified> Principal Problem: <principal problem not specified>  Patient Active Problem List   Diagnosis Date Noted  . Cognitive deficit, post-stroke   . Hypertension   . Hypoalbuminemia due to protein-calorie malnutrition ([place])   . Urinary retention   . Seizure prophylaxis   . SAH (subarachnoid hemorrhage) ([place]) 12/26/2017  . Subarachnoid hemorrhage due to ruptured aneurysm ([place]) 11/26/2017    Expected Discharge Date: Expected Discharge Date: 01/08/18  Team Members Present: Physician leading conference: Dr. Delice Lesch Social Worker Present: Lennart Pall, LCSW Nurse Present: Dorien Chihuahua, RN PT Present: Barrie Folk, PT;Rosita Dechalus, PTA OT Present: Clyda Greener, OT SLP Present: Windell Moulding, SLP PPS Coordinator present : Daiva Nakayama, RN, CRRN     Current Status/Progress Goal Weekly Team Focus  Medical   Decreased functional mobility with cognitive deficits secondary to bilateral medial frontal/temporal subarachnoid hemorrhage from aneurysmal bleed. Pt is status post basilar tip aneurysm coiling  Improve safety, cognition, BP  See above   Bowel/Bladder             Swallow/Nutrition/ Hydration             ADL's   Supervision for UB bathing and dressing, min guard for LB bathing and dressing.  Min assist for transfers to the toilet and the shower without assistive device.  Decreased memory and cognitive processing  supervision level overall  selfcare retraining, balance  retraining, transfer training, cognitive retraining, pt/family education   Mobility             Communication             Safety/Cognition/ Behavioral Observations  mod-max assist   min assist   use of memory notebook, problem solving, attention to tasks    Pain             Skin              Rehab Goals Patient on target to meet rehab goals: Yes *See Care Plan and progress notes for long and short-term goals.     Barriers to Discharge  Current Status/Progress Possible Resolutions Date Resolved   Physician    Medical stability     See above  Therapies, optimize BP meds      Nursing                  PT                    OT                  SLP                SW                Discharge Planning/Teaching Needs:  Home with spouse and other family members providing 24/ 7 supervision/ assistance.  Teaching is ongoing with spouse - here daily.   Team Discussion:  Cont of b/b with timed  toilet; keep telesitter for now due to impulsivity.  Mod/ max for cognition.  Memory notebook in use.  Supervision to min guard with ADLs;  With safety cues.  STM def.  Much better today than on eval. On track for supervision goals.  Revisions to Treatment Plan:  None    Continued Need for Acute Rehabilitation Level of Care: The patient requires daily medical management by a physician with specialized training in physical medicine and rehabilitation for the following conditions: Daily direction of a multidisciplinary physical rehabilitation program to ensure safe treatment while eliciting the highest outcome that is of practical value to the patient.: Yes Daily medical management of patient stability for increased activity during participation in an intensive rehabilitation regime.: Yes Daily analysis of laboratory values and/or radiology reports with any subsequent need for medication adjustment of medical intervention for : Neurological problems;Blood pressure problems   I attest that I  was present, lead the team conference, and concur with the assessment and plan of the team.   Jasmia Angst 01/02/2018, 3:37 PM

## 2018-01-02 NOTE — Progress Notes (Signed)
Occupational Therapy Weekly Progress Note  Patient Details  Name: Sheryl James MRN: [id number] Date of Birth: [birth year]  Beginning of progress report period: December 27, 2017 End of progress report period: January 02, 2018   Patient has met 3 of 3 short term goals.  Pt has progressed this week from a min A with moderate cuing level with self care to a supervision level with min cuing and from a min A level with transfers/mobility to a contact guard/ close S level.  The majority of the time, the pt requires contact guard due to a loss of balance due to decreased attention and awareness to her body position, decreased safety awareness, and difficulty with dual tasking.  Her memory continues to be severely impaired and needs to reference her memory notebook to recall what she did in her earlier therapy session. Her motor planning is progressing gradually but still requires cuing for sequencing, organization.  Her spouse has been very involved in her care.  Therapy will continue to focus on awareness, attention, problem solving and balance to improve her independence with self care.   Patient continues to demonstrate the following deficits: muscle weakness, decreased motor planning, decreased initiation, decreased attention, decreased awareness, decreased problem solving, decreased safety awareness, decreased memory and delayed processing and decreased standing balance and decreased balance strategies and therefore will continue to benefit from skilled OT intervention to enhance overall performance with BADL.  Patient progressing toward long term goals..  Continue plan of care.  OT Short Term Goals Week 1:  OT Short Term Goal 1 (Week 1): Pt will complete sit<stand during dressing with supervision assist and LRAD OT Short Term Goal 1 - Progress (Week 1): Met OT Short Term Goal 2 (Week 1): Pt will complete LB dressing with CGA  OT Short Term Goal 2 - Progress (Week 1): Met OT Short Term Goal 3 (Week  1): Pt will initiate completion of 1 grooming task during ADL session  OT Short Term Goal 3 - Progress (Week 1): Met Week 2:  OT Short Term Goal 1 (Week 2): STGs = LTGs       Therapy/Group: Individual Therapy  , 01/02/2018, 8:34 AM

## 2018-01-02 NOTE — Progress Notes (Signed)
Social Work Patient ID: Sheryl James, female   DOB: September 16, 1955, 62 y.o.   MRN: [id number]   Have reviewed team conference with pt and family. Both aware and agreeable with targeted d/c date of 11/14 and goals of supervision overall.  Exzavier Ruderman, LCSW

## 2018-01-02 NOTE — Progress Notes (Signed)
Physical Therapy Weekly Progress Note  Patient Details  Name: Sheryl James MRN: [id number] Date of Birth: 11/27/1955  Beginning of progress report period: December 27, 2017 End of progress report period: January 02, 2018  Today's Date: 01/02/2018 PT Individual Time:1400-1445   45 min   Patient has met 4 of 4 short term goals.  Pt has made better than expected progress towards LTG over the past week. Pt has progressed from mod-max assist to CGA for all mobility with RW. Pt continues to have constant posterior LOB with initial sit<>stand, but improves greatly throughout the day.   Patient continues to demonstrate the following deficits muscle weakness, decreased cardiorespiratoy endurance, decreased attention to right and decreased motor planning, decreased attention, decreased awareness, decreased problem solving, decreased safety awareness, decreased memory and delayed processing and decreased standing balance, decreased postural control and decreased balance strategies and therefore will continue to benefit from skilled PT intervention to increase functional independence with mobility.  Patient progressing toward long term goals..  Continue plan of care.  PT Short Term Goals Week 1:  PT Short Term Goal 1 (Week 1): Pt will maintain dynamic sitting balance during mobility activities with min assist.  PT Short Term Goal 1 - Progress (Week 1): Met PT Short Term Goal 2 (Week 1): Pt will perform stand pivot transfer from bed to WC with min assist. PT Short Term Goal 2 - Progress (Week 1): Met PT Short Term Goal 3 (Week 1): Pt will ambulate up to 150' with LRAD with CGA assist.  PT Short Term Goal 3 - Progress (Week 1): Met PT Short Term Goal 4 (Week 1): Pt will ambulate up and down 3 stairs (6") with two handrails with min assist.  PT Short Term Goal 4 - Progress (Week 1): Met Week 2:  PT Short Term Goal 1 (Week 2): STG =LTG due to ELOS  Skilled Therapeutic Interventions/Progress Updates:    Pt received sitting in WC and agreeable to PT. Gait with RW 2x 200ft; supervision assist from PT with cues for attention to task and to maintain gaze ahead of pt and off floor.   Dynamic balance training to perform cross body and lateral reaches to place horseshoes on elevated basketball goal and to play horse shoes from airex pad. Min assist throughout on airex pad and CGA from level surface. Pt noted to have greatly improved righting reactions on this day.   Gait training without AD 2 x 150ft with supervision assist-CGA with min cues for upright posture and step width in turns to prevent LOB. Gait training without AD to weave through 5 cones x 4 with min assist overall from PT with 2 near LOB, PT required to assist to prevent lateral/posteiror fall.  Patient returned to room and left sitting in WC with call bell in reach and all needs met.        Therapy Documentation Precautions:  Precautions Precautions: Fall Precaution Comments:   Restrictions Weight Bearing Restrictions: No Pain: Pain Assessment Pain Scale: Faces Pain Score: 0-No pain Vision/Perception   Therapy/Group: Individual Therapy  Austin E Tucker 01/02/2018, 2:21 PM  

## 2018-01-02 NOTE — Progress Notes (Signed)
Occupational Therapy Session Note  Patient Details  Name: Sheryl James MRN: [id number] Date of Birth: [birth year]  Today's Date: 01/02/2018 OT Individual Time: 7412-8786 OT Individual Time Calculation (min): 35 min    Skilled Therapeutic Interventions/Progress Updates:    1;1.Pt completes ambulation with min A overall for balance but up to MOD A for R lateral LOB. Pt completes furniture transfers and shower transfer with CGA. Family educated on grab bar placement and non slip treds for shower floor. Pt completes kitchen search for items in cabinets/appliances with up to MAX A for posterior lean only able to be corrected by cueing pt to reach forward towards counter. Pt requires MOD cueing for wristing in [place] book at end of session. Exited session with pt seatd in w/c and family present supervising.   Therapy Documentation Precautions:  Precautions Precautions: Fall Precaution Comments:   Restrictions Weight Bearing Restrictions: No   Therapy/Group: Individual Therapy  Tonny Branch 01/02/2018, 4:13 PM

## 2018-01-02 NOTE — Progress Notes (Signed)
[facility] PHYSICAL MEDICINE & REHABILITATION PROGRESS NOTE  Subjective/Complaints: No new issues overnight.   ROS: Patient denies fever, rash, sore throat, blurred vision, nausea, vomiting, diarrhea, cough, shortness of breath or chest pain, joint or back pain, headache, or mood change.    Objective: Vital Signs: Blood pressure (!) 135/56, pulse 72, temperature 98 F (36.7 C), temperature source Oral, resp. rate 18, SpO2 93 %. No results found. No results for input(s): WBC, HGB, HCT, PLT in the last 72 hours. No results for input(s): NA, K, CL, CO2, GLUCOSE, BUN, CREATININE, CALCIUM in the last 72 hours.   Physical Exam: BP (!) 135/56 (BP Location: Left Arm)   Pulse 72   Temp 98 F (36.7 C) (Oral)   Resp 18   SpO2 93%  Constitutional: No distress . Vital signs reviewed. HEENT: EOMI, oral membranes moist Neck: supple Cardiovascular: RRR without murmur. No JVD    Respiratory: CTA Bilaterally without wheezes or rales. Normal effort    GI: BS +, non-tender, non-distended  Musculoskeletal:No edema or tenderness in extremities Neurological: She isalert and oriented x 3, except for date Processing delays.  Motor: Grossly 4/5 throughout, stable Skin: She isnot diaphoretic.  See above Psychiatric: Flat  Assessment/Plan: 1. Functional deficits secondary to bilateral SAH status post ventriculostomy which require 3+ hours per day of interdisciplinary therapy in a comprehensive inpatient rehab setting.  Physiatrist is providing close team supervision and 24 hour management of active medical problems listed below.  Physiatrist and rehab team continue to assess barriers to discharge/monitor patient progress toward functional and medical goals  Care Tool:  Bathing    Body parts bathed by patient: Right arm, Left arm, Chest, Abdomen, Front perineal area, Buttocks, Right upper leg, Left upper leg, Right lower leg, Left lower leg, Face         Bathing assist Assist Level:  Supervision/Verbal cueing     Upper Body Dressing/Undressing Upper body dressing Upper body dressing/undressing activity did not occur (including orthotics): N/A What is the patient wearing?: Pull over shirt, Bra    Upper body assist Assist Level: Supervision/Verbal cueing    Lower Body Dressing/Undressing Lower body dressing    Lower body dressing activity did not occur: N/A What is the patient wearing?: Underwear/pull up, Pants     Lower body assist Assist for lower body dressing: Supervision/Verbal cueing     Toileting Toileting Toileting Activity did not occur (Clothing management and hygiene only): N/A (no void or bm)  Toileting assist Assist for toileting: Contact Guard/Touching assist     Transfers Chair/bed transfer  Transfers assist     Chair/bed transfer assist level: Supervision/Verbal cueing     Locomotion Ambulation   Ambulation assist      Assist level: Contact Guard/Touching assist Assistive device: Walker-rolling Max distance: 200   Walk 10 feet activity   Assist     Assist level: Contact Guard/Touching assist Assistive device: Hand held assist   Walk 50 feet activity   Assist    Assist level: Contact Guard/Touching assist Assistive device: Walker-rolling    Walk 150 feet activity   Assist Walk 150 feet activity did not occur: Safety/medical concerns  Assist level: Contact Guard/Touching assist Assistive device: Walker-rolling    Walk 10 feet on uneven surface  activity   Assist Walk 10 feet on uneven surfaces activity did not occur: Safety/medical concerns     Assistive device: Aeronautical engineer   Type of Wheelchair: Agricultural engineer  assist level: Minimal Assistance - Patient > 75% Max wheelchair distance: 30 ft    Wheelchair 50 feet with 2 turns activity    Assist    Wheelchair 50 feet with 2 turns activity did not occur: Safety/medical concerns       Wheelchair 150  feet activity     Assist Wheelchair 150 feet activity did not occur: Safety/medical concerns          Medical Problem List and Plan: 1.Decreased functional mobility with cognitive deficitssecondary to bilateral medial frontal/temporalsubarachnoid hemorrhage from aneurysmal bleed. Pt isstatus post basilar tip aneurysm coiling  Cont CIR  Continues to be limited by cognition 2. DVT Prophylaxis/Anticoagulation: SCDs 3. Pain Management:Ultram DC'd, Tylenol as needed 4. Mood:Provide emotional support 5. Neuropsych: This patientisnot capable of making decisions on herown behalf. 6. Skin/Wound Care:Routine skin checks 7. Fluids/Electrolytes/Nutrition:Routine in and outs  BMP within normal limits on 11/4 8.Seizure prophylaxis. Keppra 500 mg twice daily 9.Urinary retention.   Appears to be emptying without issue this week  Urecholine 3 times daily, Flomax 0.4 mg daily.   Foley DC'd on 11/4 10.Diarrhea. Improved. Continue Imodium as needed as well as Florastor -encourage PO 11.Tobacco abuse. Provide counseling 12.Pseudomonas UTI. Completedcourse of Cipro. 13.  Hypoalbuminemia  Supplement initiated on 11/4 14.  Hypertension  Lisinopril 2.5 started on 11/5  Improved on 11/7  LOS: 7 days A FACE TO [place] 01/02/2018, 10:39 AM

## 2018-01-02 NOTE — Progress Notes (Signed)
Occupational Therapy Session Note  Patient Details  Name: Sheryl James MRN: [id number] Date of Birth: 17-Dec-1955  Today's Date: 01/02/2018 OT Individual Time: 1102-1201 OT Individual Time Calculation (min): 59 min    Short Term Goals: Week 2:  OT Short Term Goal 1 (Week 2): STGs = LTGs  Skilled Therapeutic Interventions/Progress Updates:    Had pt ambulate down to the dayroom with min hand held assist during session.  She worked on simple counting task as well as balance with use of the corn hole game.  She was able to pick up bean bags from the board and the floor with short distance mobility to and from with overall min assist.  She needed max questioning cueing however to keep up with total number of points cumulative.  Had pt write down numbers and complete simple addition to help her keep up with the total.  After task had pt work in sitting on simple addition and problem solving with use of twenty one card game.  She was able to total points better and determine winner with 75% accuracy.  Finished session with ambulation back to the room.  Pt oriented to day of the week and month this session.  She was also able to recall one task completed with speech therapist earlier this morning, but not the speech therapist's name.  Pt left with family at end of session for lunch.  She also worked on Estate agent in her memory book at end of session with family assist.    Therapy Documentation Precautions:  Precautions Precautions: Fall Precaution Comments:   Restrictions Weight Bearing Restrictions: No  Pain: Pain Assessment Pain Scale: Faces Pain Score: 0-No pain   Therapy/Group: Individual Therapy  Cassandria Drew OTR/L 01/02/2018, 12:58 PM

## 2018-01-03 ENCOUNTER — Inpatient Hospital Stay (HOSPITAL_COMMUNITY): Payer: 59 | Admitting: Physical Therapy

## 2018-01-03 MED ORDER — BETHANECHOL CHLORIDE 10 MG PO TABS
5.0000 mg | ORAL_TABLET | Freq: Three times a day (TID) | ORAL | Status: DC
  Administered 2018-01-03 – 2018-01-04 (×4): 5 mg via ORAL
  Filled 2018-01-03 (×4): qty 1

## 2018-01-03 NOTE — Progress Notes (Signed)
[place] PHYSICAL MEDICINE & REHABILITATION PROGRESS NOTE  Subjective/Complaints:  bladder doing ok, discussed bladder stimulant med  ROS: Patient denies CP, SOB, N/V/D   Objective: Vital Signs: Blood pressure 119/63, pulse 72, temperature 98 F (36.7 C), resp. rate 15, SpO2 97 %. No results found. No results for input(s): WBC, HGB, HCT, PLT in the last 72 hours. No results for input(s): NA, K, CL, CO2, GLUCOSE, BUN, CREATININE, CALCIUM in the last 72 hours.   Physical Exam: BP 119/63 (BP Location: Left Wrist)   Pulse 72   Temp 98 F (36.7 C)   Resp 15   SpO2 97%  Constitutional: No distress . Vital signs reviewed. HEENT: EOMI, oral membranes moist Neck: supple Cardiovascular: RRR without murmur. No JVD    Respiratory: CTA Bilaterally without wheezes or rales. Normal effort    GI: BS +, non-tender, non-distended  Musculoskeletal:No edema or tenderness in extremities Neurological: She isalert and oriented x 3, except for date Processing delays.  Motor: Grossly 4/5 throughout, stable Skin: She isnot diaphoretic.  See above Psychiatric: Flat  Assessment/Plan: 1. Functional deficits secondary to bilateral SAH status post ventriculostomy which require 3+ hours per day of interdisciplinary therapy in a comprehensive inpatient rehab setting.  Physiatrist is providing close team supervision and 24 hour management of active medical problems listed below.  Physiatrist and rehab team continue to assess barriers to discharge/monitor patient progress toward functional and medical goals  Care Tool:  Bathing    Body parts bathed by patient: Right arm, Left arm, Chest, Abdomen, Front perineal area, Buttocks, Right upper leg, Left upper leg, Right lower leg, Left lower leg, Face         Bathing assist Assist Level: Supervision/Verbal cueing     Upper Body Dressing/Undressing Upper body dressing Upper body dressing/undressing activity did not occur (including orthotics):  N/A What is the patient wearing?: Pull over shirt, Bra    Upper body assist Assist Level: Supervision/Verbal cueing    Lower Body Dressing/Undressing Lower body dressing    Lower body dressing activity did not occur: N/A What is the patient wearing?: Underwear/pull up, Pants     Lower body assist Assist for lower body dressing: Supervision/Verbal cueing     Toileting Toileting Toileting Activity did not occur (Clothing management and hygiene only): N/A (no void or bm)  Toileting assist Assist for toileting: Contact Guard/Touching assist     Transfers Chair/bed transfer  Transfers assist     Chair/bed transfer assist level: Supervision/Verbal cueing     Locomotion Ambulation   Ambulation assist      Assist level: Minimal Assistance - Patient > 75% Assistive device: Hand held assist Max distance: 200   Walk 10 feet activity   Assist     Assist level: Contact Guard/Touching assist Assistive device: Hand held assist   Walk 50 feet activity   Assist    Assist level: Contact Guard/Touching assist Assistive device: Walker-rolling    Walk 150 feet activity   Assist Walk 150 feet activity did not occur: Safety/medical concerns  Assist level: Contact Guard/Touching assist Assistive device: Walker-rolling    Walk 10 feet on uneven surface  activity   Assist Walk 10 feet on uneven surfaces activity did not occur: Safety/medical concerns     Assistive device: Aeronautical engineer   Type of Wheelchair: Manual    Wheelchair assist level: Minimal Assistance - Patient > 75% Max wheelchair distance: 30 ft    Wheelchair 50 feet with  2 turns activity    Assist    Wheelchair 50 feet with 2 turns activity did not occur: Safety/medical concerns       Wheelchair 150 feet activity     Assist Wheelchair 150 feet activity did not occur: Safety/medical concerns          Medical Problem List and  Plan: 1.Decreased functional mobility with cognitive deficitssecondary to bilateral medial frontal/temporalsubarachnoid hemorrhage from aneurysmal bleed. Pt isstatus post basilar tip aneurysm coiling  Cont CIR PT, OTSLP Continues to be limited by cognition oriented to day today 2. DVT Prophylaxis/Anticoagulation: SCDs 3. Pain Management:Ultram DC'd, Tylenol as needed 4. Mood:Provide emotional support 5. Neuropsych: This patientisnot capable of making decisions on herown behalf. 6. Skin/Wound Care:Routine skin checks 7. Fluids/Electrolytes/Nutrition:Routine in and outs  BMP within normal limits on 11/4 8.Seizure prophylaxis. Keppra 500 mg twice daily 9.Urinary retention.   Resolved   Urecholine 3 times daily, Flomax 0.4 mg daily. , will reduce urecholine  Foley DC'd on 11/4 10.Diarrhea. Improved. Continue Imodium as needed as well as Florastor -encourage PO 11.Tobacco abuse. Provide counseling 12.Pseudomonas UTI. Completedcourse of Cipro. 13.  Hypoalbuminemia  Supplement initiated on 11/4 14.  Hypertension  Lisinopril 2.5 started on 11/5   Vitals:   01/02/18 1938 01/03/18 0435  BP: (!) 113/49 119/63  Pulse: 72 72  Resp: 15 15  Temp: 98.1 F (36.7 C) 98 F (36.7 C)  SpO2: 96% 97%  controlled 11/9  LOS: 8 days A FACE TO FACE EVALUATION WAS PERFORMED  Charlett Blake 01/03/2018, 6:51 AM

## 2018-01-03 NOTE — Plan of Care (Signed)
  Problem: RH BLADDER ELIMINATION Goal: RH STG MANAGE BLADDER WITH ASSISTANCE Description STG Manage Bladder With min assistance  Outcome: Not Progressing Note:  Incontinent of urine   Problem: RH SAFETY Goal: RH STG ADHERE TO SAFETY PRECAUTIONS W/ASSISTANCE/DEVICE Description STG Adhere to Safety Precautions With min Assistance/Device.  Outcome: Not Progressing Note:  Pt impulsive; with telesitter

## 2018-01-03 NOTE — Progress Notes (Signed)
Physical Therapy Session Note  Patient Details  Name: Sheryl James MRN: [id number] Date of Birth: 1955/03/15  Today's Date: 01/03/2018 PT Individual Time: 0915-0945 PT Individual Time Calculation (min): 30 min   Short Term Goals: Week 2:  PT Short Term Goal 1 (Week 2): STG =LTG due to ELOS  Skilled Therapeutic Interventions/Progress Updates:    Pt received sitting in WC and agreeable to PT.   Gait training 200' with RW x2. CGA from SPT. Pt requires cuing to avoid cervical flexion and kyphotic posture when walking. Occasional verbal cuing for maintaining appropriate direction with the walker; tendency to veer right when distracted.   Standing dynamic balance exercise. Pt played life sized connect four while standing on airex foam x2. Min assist from SPT. Verbal and tactile cuing to prevent posterior LOB/lean during standing. Pt able to appropriately reach and place game pieces with minimal cuing. Most instances of posterior LOB occurred when pt was cognitively engaged with the game. Pt picked up connect four game pieces from floor and placed on stand; CGA from SPT, no LOB during this activity. Pt required mod assist from SPT with UE support from PT to step backwards off the foam pad at end of activity.  Ended session with pt seated in WC, call bell within reach, and all needs met.  Therapy Documentation Precautions:  Precautions Precautions: Fall Precaution Comments:   Restrictions Weight Bearing Restrictions: No  Pain:  0/10   Therapy/Group: Individual Therapy  Amador Cunas 01/03/2018, 10:27 AM

## 2018-01-04 ENCOUNTER — Inpatient Hospital Stay (HOSPITAL_COMMUNITY): Payer: 59 | Admitting: Occupational Therapy

## 2018-01-04 MED ORDER — BETHANECHOL CHLORIDE 10 MG PO TABS
10.0000 mg | ORAL_TABLET | Freq: Three times a day (TID) | ORAL | Status: DC
  Administered 2018-01-04 – 2018-01-08 (×12): 10 mg via ORAL
  Filled 2018-01-04 (×12): qty 1

## 2018-01-04 NOTE — Progress Notes (Signed)
[place] PHYSICAL MEDICINE & REHABILITATION PROGRESS NOTE  Subjective/Complaints: Required cath on lower dose urecholine yesterday but voided this am  ROS: Patient denies CP, SOB, N/V/D   Objective: Vital Signs: Blood pressure (!) 152/65, pulse 77, temperature 97.8 F (36.6 C), temperature source Oral, resp. rate 17, SpO2 95 %. No results found. No results for input(s): WBC, HGB, HCT, PLT in the last 72 hours. No results for input(s): NA, K, CL, CO2, GLUCOSE, BUN, CREATININE, CALCIUM in the last 72 hours.   Physical Exam: BP (!) 152/65 (BP Location: Left Arm)   Pulse 77   Temp 97.8 F (36.6 C) (Oral)   Resp 17   SpO2 95%  Constitutional: No distress . Vital signs reviewed. HEENT: EOMI, oral membranes moist Neck: supple Cardiovascular: RRR without murmur. No JVD    Respiratory: CTA Bilaterally without wheezes or rales. Normal effort    GI: BS +, non-tender, non-distended  Musculoskeletal:No edema or tenderness in extremities Neurological: She isalert and oriented x 3, except for date Processing delays.  Motor: Grossly 4/5 throughout, stable Skin: She isnot diaphoretic.  See above Psychiatric: Flat  Assessment/Plan: 1. Functional deficits secondary to bilateral SAH status post ventriculostomy which require 3+ hours per day of interdisciplinary therapy in a comprehensive inpatient rehab setting.  Physiatrist is providing close team supervision and 24 hour management of active medical problems listed below.  Physiatrist and rehab team continue to assess barriers to discharge/monitor patient progress toward functional and medical goals  Care Tool:  Bathing    Body parts bathed by patient: Right arm, Left arm, Chest, Abdomen, Front perineal area, Buttocks, Right upper leg, Left upper leg, Right lower leg, Left lower leg, Face         Bathing assist Assist Level: Supervision/Verbal cueing     Upper Body Dressing/Undressing Upper body dressing Upper body  dressing/undressing activity did not occur (including orthotics): N/A What is the patient wearing?: Pull over shirt, Bra    Upper body assist Assist Level: Supervision/Verbal cueing    Lower Body Dressing/Undressing Lower body dressing    Lower body dressing activity did not occur: N/A What is the patient wearing?: Underwear/pull up, Pants     Lower body assist Assist for lower body dressing: Supervision/Verbal cueing     Toileting Toileting Toileting Activity did not occur (Clothing management and hygiene only): N/A (no void or bm)  Toileting assist Assist for toileting: Contact Guard/Touching assist     Transfers Chair/bed transfer  Transfers assist     Chair/bed transfer assist level: Supervision/Verbal cueing     Locomotion Ambulation   Ambulation assist      Assist level: Contact Guard/Touching assist Assistive device: Walker-rolling Max distance: 400   Walk 10 feet activity   Assist     Assist level: Contact Guard/Touching assist Assistive device: Walker-rolling   Walk 50 feet activity   Assist    Assist level: Contact Guard/Touching assist Assistive device: Walker-rolling    Walk 150 feet activity   Assist Walk 150 feet activity did not occur: Safety/medical concerns  Assist level: Contact Guard/Touching assist Assistive device: Walker-rolling    Walk 10 feet on uneven surface  activity   Assist Walk 10 feet on uneven surfaces activity did not occur: Safety/medical concerns     Assistive device: Aeronautical engineer   Type of Wheelchair: Manual    Wheelchair assist level: Minimal Assistance - Patient > 75% Max wheelchair distance: 30 ft    Wheelchair 50  feet with 2 turns activity    Assist    Wheelchair 50 feet with 2 turns activity did not occur: Safety/medical concerns       Wheelchair 150 feet activity     Assist Wheelchair 150 feet activity did not occur: Safety/medical  concerns          Medical Problem List and Plan: 1.Decreased functional mobility with cognitive deficitssecondary to bilateral medial frontal/temporalsubarachnoid hemorrhage from aneurysmal bleed. Pt isstatus post basilar tip aneurysm coiling  Cont CIR PT, OT, SLP  2. DVT Prophylaxis/Anticoagulation: SCDs 3. Pain Management:Ultram DC'd, Tylenol as needed 4. Mood:Provide emotional support 5. Neuropsych: This patientisnot capable of making decisions on herown behalf. 6. Skin/Wound Care:Routine skin checks 7. Fluids/Electrolytes/Nutrition:Routine in and outs  BMP within normal limits on 11/4 8.Seizure prophylaxis. Keppra 500 mg twice daily 9.Urinary retention.   req cath x 1    Urecholine 3 times daily, Flomax 0.4 mg daily. , will resume urecholine at 10mg  TID (stqrted retaining again on 5mg )   Foley DC'd on 11/4 10.Diarrhea. Improved. Continue Imodium as needed as well as Florastor -encourage PO 11.Tobacco abuse. Provide counseling 12.Pseudomonas UTI. Completedcourse of Cipro. 13.  Hypoalbuminemia  Supplement initiated on 11/4 14.  Hypertension  Lisinopril 2.5 started on 11/5   Vitals:   01/03/18 1949 01/04/18 0503  BP: (!) 135/46 (!) 152/65  Pulse: 70 77  Resp: 16 17  Temp: (!) 97.5 F (36.4 C) 97.8 F (36.6 C)  SpO2: 95% 95%  controlled 11/10  LOS: 9 days A FACE TO FACE EVALUATION WAS PERFORMED  Luanna Salk Kirsteins 01/04/2018, 8:01 AM

## 2018-01-04 NOTE — Progress Notes (Signed)
Occupational Therapy Session Note  Patient Details  Name: Sheryl James MRN: [id number] Date of Birth: 1955/11/17  Today's Date: 01/04/2018 OT Individual Time: 8891-6945 OT Individual Time Calculation (min): 70 min   Short Term Goals: Week 2:  OT Short Term Goal 1 (Week 2): STGs = LTGs  Skilled Therapeutic Interventions/Progress Updates:    Pt greeted in w/c and agreeable to shower. Tx focus on dynamic balance, NMR, cognitive remediation, and endurance during bathing, dressing, grooming, and IADL tasks. All functional transfers completed using RW at ambulatory level with steady assist. She had a few small lateral or posterior LOBs when she tried to do things in a hurry. Required steady A to correct. She gathered clothing items before shower, with cues for RW mgt including keeping device on floor. When showering and then dressing EOB, pt required steady assist and cues for safety at sit<stand level. She completed oral care/grooming tasks in standing as well. For remainder of session, worked on sustained attention, initiation, and standing balance via bedmaking task. Pt verbalizing necessary items for task with mod vcs, and then stood at elevated bed with steady assist. She side-stepped up and down bed to retrieve linen draped over baseboard and adjust linen as needed. Pt removing B UE support from bed to spread sheets and comforters. Sister stood on opposite side of bed to help out with evenness. Pt then doffed/donned pillowcases while seated with min vcs. We recorded these events in memory notebook with min verbal cuing. At end of session she was left with family and all needs.   Therapy Documentation Precautions:  Precautions Precautions: Fall Precaution Comments:   Restrictions Weight Bearing Restrictions: No Pain: No c/o pain during tx    ADL: ADL Eating: Not assessed Grooming: Contact guard Where Assessed-Grooming: Standing at sink Upper Body Bathing: Supervision/safety, Moderate  cueing Where Assessed-Upper Body Bathing: Shower Lower Body Bathing: Contact guard, Moderate cueing Where Assessed-Lower Body Bathing: Shower Upper Body Dressing: Supervision/safety, Minimal cueing Where Assessed-Upper Body Dressing: Sitting at sink Lower Body Dressing: Minimal assistance, Minimal cueing Where Assessed-Lower Body Dressing: Standing at sink, Sitting at sink Toileting: Contact guard Where Assessed-Toileting: Glass blower/designer: Psychiatric nurse Method: Arts development officer: Energy manager: Environmental education officer Method: Radiographer, therapeutic: Shower seat with back, Grab bars ADL Comments: Most cuing required for initiation and sustained attention      Therapy/Group: Individual Therapy  Fayne Mcguffee A Ridhaan Dreibelbis 01/04/2018, 12:42 PM

## 2018-01-05 ENCOUNTER — Inpatient Hospital Stay (HOSPITAL_COMMUNITY): Payer: 59 | Admitting: Occupational Therapy

## 2018-01-05 ENCOUNTER — Encounter (HOSPITAL_COMMUNITY): Payer: 59 | Admitting: Psychology

## 2018-01-05 ENCOUNTER — Inpatient Hospital Stay (HOSPITAL_COMMUNITY): Payer: 59 | Admitting: Speech Pathology

## 2018-01-05 ENCOUNTER — Inpatient Hospital Stay (HOSPITAL_COMMUNITY): Payer: 59 | Admitting: Physical Therapy

## 2018-01-05 MED ORDER — TAMSULOSIN HCL 0.4 MG PO CAPS
0.8000 mg | ORAL_CAPSULE | Freq: Every day | ORAL | Status: DC
  Administered 2018-01-06 – 2018-01-08 (×3): 0.8 mg via ORAL
  Filled 2018-01-05 (×3): qty 2

## 2018-01-05 NOTE — Progress Notes (Signed)
Occupational Therapy Session Note  Patient Details  Name: Sheryl James MRN: [id number] Date of Birth: [birth year]  Today's Date: 01/05/2018 OT Individual Time: 1320-1350 OT Individual Time Calculation (min): 30 min    Short Term Goals: Week 2:  OT Short Term Goal 1 (Week 2): STGs = LTGs  Skilled Therapeutic Interventions/Progress Updates:    Pt completed functional mobility with min assist to the ADL apartment, accompanied by her daughter, spouse, and sister-in-law.  Occasional scissoring and LOB to the right noted during mobility.  She was able to complete simulated walk-in shower transfer with min hand held assist and simulation of home setup.  Once completed, had pt ambulate to the therapy gym and work on cognitive task of orienting and placing ping pong balls in egg carton to match a picture diagram.  She needed mod questioning cueing to recognize details of the differences while sitting EOM.  Finished session with handoff to PT.    Therapy Documentation Precautions:  Precautions Precautions: Fall Precaution Comments:   Restrictions Weight Bearing Restrictions: No General: General OT Amount of Missed Time: 20 Minutes  Pain: Pain Assessment Pain Scale: 0-10 Pain Score: 0-No pain Faces Pain Scale: No hurt   Therapy/Group: Individual Therapy  Maurisa Tesmer OTR/L 01/05/2018, 3:46 PM

## 2018-01-05 NOTE — Progress Notes (Signed)
Occupational Therapy Session Note  Patient Details  Name: Sheryl James MRN: [id number] Date of Birth: Jan 26, 1956  Today's Date: 01/05/2018 OT Individual Time: 1000-1100 OT Individual Time Calculation (min): 60 min    Short Term Goals: Week 2:  OT Short Term Goal 1 (Week 2): STGs = LTGs  Skilled Therapeutic Interventions/Progress Updates:    Pt received in room with family.  telesitter turned off for privacy and family left for pt to begin her ADLs. Pt ambulated with CGA to find clothing bag, as she was stepping back to bed to sit down she was looking in her bag at the same time and had a LOB with min A to recover.  Reminded pt to focus on one thing at a time.  Pt ambulated to bathroom to sit on chair to undress, then to shower and then back to chair to dress.  She had approximately 5 LOB mostly posterior or to her L side with min A to recover. Often her feet were very close together.  Pt ambulated to sink and cued to stand with her feet wider apart and slightly staggered.  She completed grooming and sat back in w/c.   Spouse and sister in law returned and educated them on her challenge with dual tasking, to be aware of foot placement, and that she continues to stand up by herself.  Demonstrated with patient home balance exercises he can do with pt such as "dancing".  He supports her around the waist and she practices stepping out and in and then forward and back with L foot and then R foot.  Pt did well with this activity needing CGA.  She followed these directions well.  She also recalled the name of her therapist yesterday and what she did in her therapy class.   Pt in room with her family. Telesitter left off as pt had an appt with the Neuropsychologist.  RN aware.  Therapy Documentation Precautions:  Precautions Precautions: Fall Precaution Comments:   Restrictions Weight Bearing Restrictions: No  Pain:   ADL: ADL Eating: Not assessed Grooming: Contact guard Where  Assessed-Grooming: Standing at sink Upper Body Bathing: Supervision/safety, Moderate cueing Where Assessed-Upper Body Bathing: Shower Lower Body Bathing: Contact guard, Moderate cueing Where Assessed-Lower Body Bathing: Shower Upper Body Dressing: Supervision/safety, Minimal cueing Where Assessed-Upper Body Dressing: Sitting at sink Lower Body Dressing: Minimal assistance, Minimal cueing Where Assessed-Lower Body Dressing: Standing at sink, Sitting at sink Toileting: Contact guard Where Assessed-Toileting: Glass blower/designer: Psychiatric nurse Method: Arts development officer: Energy manager: Environmental education officer Method: Radiographer, therapeutic: Shower seat with back, Grab bars ADL Comments: Most cuing required for initiation and sustained attention       Therapy/Group: Individual Therapy  Sheryl James 01/05/2018, 9:05 AM

## 2018-01-05 NOTE — Progress Notes (Signed)
[facility] PHYSICAL MEDICINE & REHABILITATION PROGRESS NOTE  Subjective/Complaints: Patient seen this morning.  She states she slept well overnight.  She states she has a good weekend.  Family at bedside.  ROS: Denies CP, SOB, N/V/D   Objective: Vital Signs: Blood pressure (!) 142/65, pulse 80, temperature 98.2 F (36.8 C), temperature source Oral, resp. rate 18, SpO2 94 %. No results found. No results for input(s): WBC, HGB, HCT, PLT in the last 72 hours. No results for input(s): NA, K, CL, CO2, GLUCOSE, BUN, CREATININE, CALCIUM in the last 72 hours.   Physical Exam: BP (!) 142/65 (BP Location: Right Arm)   Pulse 80   Temp 98.2 F (36.8 C) (Oral)   Resp 18   SpO2 94%  Constitutional: No distress . Vital signs reviewed. HENT: Normocephalic.  Atraumatic. Eyes: EOMI. No discharge. Cardiovascular: RRR. No JVD. Respiratory: CTA Bilaterally. Normal effort. GI: BS +. Non-distended. Musc: No edema or tenderness in extremities. Neurological: She isalert and oriented x3, except for date Processing delays.  Motor:  RUE/RLE: 4+/5 proximal distal LUE/LLE: 4-4+/5 proximal to distal Skin: She isnot diaphoretic.  See above Psychiatric: Flat  Assessment/Plan: 1. Functional deficits secondary to bilateral SAH status post ventriculostomy which require 3+ hours per day of interdisciplinary therapy in a comprehensive inpatient rehab setting.  Physiatrist is providing close team supervision and 24 hour management of active medical problems listed below.  Physiatrist and rehab team continue to assess barriers to discharge/monitor patient progress toward functional and medical goals  Care Tool:  Bathing    Body parts bathed by patient: Right arm, Left arm, Chest, Abdomen, Front perineal area, Buttocks, Right upper leg, Left upper leg, Right lower leg, Left lower leg, Face         Bathing assist Assist Level: Contact Guard/Touching assist     Upper Body Dressing/Undressing Upper  body dressing Upper body dressing/undressing activity did not occur (including orthotics): N/A What is the patient wearing?: Pull over shirt, Bra    Upper body assist Assist Level: Minimal Assistance - Patient > 75%    Lower Body Dressing/Undressing Lower body dressing    Lower body dressing activity did not occur: N/A What is the patient wearing?: Underwear/pull up, Pants     Lower body assist Assist for lower body dressing: Contact Guard/Touching assist     Toileting Toileting Toileting Activity did not occur (Clothing management and hygiene only): N/A (no void or bm)  Toileting assist Assist for toileting: Contact Guard/Touching assist     Transfers Chair/bed transfer  Transfers assist     Chair/bed transfer assist level: Supervision/Verbal cueing     Locomotion Ambulation   Ambulation assist      Assist level: Contact Guard/Touching assist Assistive device: Walker-rolling Max distance: 400   Walk 10 feet activity   Assist     Assist level: Contact Guard/Touching assist Assistive device: Walker-rolling   Walk 50 feet activity   Assist    Assist level: Contact Guard/Touching assist Assistive device: Walker-rolling    Walk 150 feet activity   Assist Walk 150 feet activity did not occur: Safety/medical concerns  Assist level: Contact Guard/Touching assist Assistive device: Walker-rolling    Walk 10 feet on uneven surface  activity   Assist Walk 10 feet on uneven surfaces activity did not occur: Safety/medical concerns     Assistive device: Aeronautical engineer   Type of Wheelchair: Manual    Wheelchair assist level: Minimal Assistance - Patient > 75%  Max wheelchair distance: 30 ft    Wheelchair 50 feet with 2 turns activity    Assist    Wheelchair 50 feet with 2 turns activity did not occur: Safety/medical concerns       Wheelchair 150 feet activity     Assist Wheelchair 150 feet activity  did not occur: Safety/medical concerns          Medical Problem List and Plan: 1.Decreased functional mobility with cognitive deficitssecondary to bilateral medial frontal/temporalsubarachnoid hemorrhage from aneurysmal bleed. Pt isstatus post basilar tip aneurysm coiling  Cont CIR   Weekend notes reviewed, discussed with physician 2. DVT Prophylaxis/Anticoagulation: SCDs 3. Pain Management:Ultram DC'd, Tylenol as needed 4. Mood:Provide emotional support 5. Neuropsych: This patientisnot capable of making decisions on herown behalf. 6. Skin/Wound Care:Routine skin checks 7. Fluids/Electrolytes/Nutrition:Routine in and outs  BMP within normal limits on 11/4 8.Seizure prophylaxis. Keppra 500 mg twice daily, will follow-up on duration of treatment 9.Urinary retention.   Urecholine 3 times daily  Flomax 0.4 mg daily, increased on 11/11.   Foley DC'd on 11/4 10.Diarrhea. Improved. Continue Imodium as needed as well as Florastor -encourage PO 11.Tobacco abuse. Provide counseling 12.Pseudomonas UTI. Completedcourse of Cipro. 13.  Hypoalbuminemia  Supplement initiated on 11/4 14.  Hypertension  Lisinopril 2.5 started on 11/5   Vitals:   01/04/18 1956 01/05/18 0359  BP: 128/62 (!) 142/65  Pulse: 74 80  Resp: 18 18  Temp: 98.2 F (36.8 C) 98.2 F (36.8 C)  SpO2: 95% 94%   Slightly labile on 11/11  LOS: 10 days A FACE TO FACE EVALUATION WAS PERFORMED  Karly Pitter Lorie Phenix 01/05/2018, 10:12 AM

## 2018-01-05 NOTE — Progress Notes (Signed)
Speech Language Pathology Daily Session Note  Patient Details  Name: Sheryl James MRN: [id number] Date of Birth: [birth year]  Today's Date: 01/05/2018 SLP Individual Time: 0900-0930 SLP Individual Time Calculation (min): 30 min  Short Term Goals: Week 2: SLP Short Term Goal 1 (Week 2): STG=LTG due to remaining length of stay   Skilled Therapeutic Interventions:    Skilled treatment session focused on cognition goals. SLP facilitated session by asking pt what she did with ST last week. Pt independently recalled that she had memory book written with the info in it (book was located out of pt's view but she still recalled having the book). She further located date specified by SLP and read the general info listed. SLP further facilitated session by providing Max A cues for attention to dealing cards into 2 piles. Pt frequently distracted by looking at underside of cards. Pt able to problem solve each comparison of cards correctly. She transferred to info from our session into memory book. Pt left upright in wheelchair, chair alarm on tele-sitter present and all needs within reach. Continue per current plan of care.   Pain Pain Assessment Pain Scale: 0-10 Pain Score: 0-No pain  Therapy/Group: Individual Therapy  Lannie Yusuf 01/05/2018, 9:47 AM

## 2018-01-05 NOTE — Progress Notes (Signed)
Physical Therapy Session Note  Patient Details  Name: Sheryl James MRN: [id number] Date of Birth: Feb 21, 1956  Today's Date: 01/05/2018 PT Individual Time: 1350-1450 PT Individual Time Calculation (min): 60 min   Short Term Goals: Week 2:  PT Short Term Goal 1 (Week 2): STG =LTG due to ELOS  Skilled Therapeutic Interventions/Progress Updates: Pt presented sitting EOM with granddaughter present handed off from OT. Pt completed task of placing ping pong balls in egg crate in correct orientation. Pt was able to place correct colored balls in place however required modA for correct orientation. Pt then participated in obstacle course stepping over threshold and weaving through cones. Pt performed with HHA and required minA due to narrow BOS when turning and increased posterior lean when stepping over threshold requiring minA for correct despite verbal cues. Pt performed placing clothespins on basketball net while standing on red wedge with pt requiring tactile cues for increasing anterior lean. Pt was able to correct intermittently however required consistent modA for maintaining erect posture. Pt also played horseshoes on Airex with pt able to maintain midline posture with decreased assistance. Pt then ambulated to day room and participated in Biodex LOS x 2 trials with BUE support. Pt required verbal cues for shifting wt to correct target approx 50% during x 2 trials. Pt also required verbal cues for descending step safely. Pt was then able to ambulate to elevator with HHA with pt requiring increased verbal cues for B foot clearance and attention to task. PTA encouraged break at elevators and obtained RW for pt. After brief rest pt was able to complete ambulation to room with RW and CGA. Pt returned to w/c at end of session and left with call bell within reach, needs met and family present.      Therapy Documentation Precautions:  Precautions Precautions: Fall Precaution Comments:    Restrictions Weight Bearing Restrictions: No General:   Vital Signs: Therapy Vitals Temp: 98.2 F (36.8 C) Temp Source: Oral Pulse Rate: 96 Resp: 20 BP: 134/66 Patient Position (if appropriate): Sitting Oxygen Therapy SpO2: 100 % Pain: Pain Assessment Pain Scale: 0-10 Pain Score: 0-No pain Faces Pain Scale: No hurt    Therapy/Group: Individual Therapy  Tambra Muller 01/05/2018, 4:08 PM

## 2018-01-06 ENCOUNTER — Inpatient Hospital Stay (HOSPITAL_COMMUNITY): Payer: 59 | Admitting: Occupational Therapy

## 2018-01-06 ENCOUNTER — Inpatient Hospital Stay (HOSPITAL_COMMUNITY): Payer: 59 | Admitting: Physical Therapy

## 2018-01-06 ENCOUNTER — Inpatient Hospital Stay (HOSPITAL_COMMUNITY): Payer: 59 | Admitting: Speech Pathology

## 2018-01-06 NOTE — Progress Notes (Signed)
Occupational Therapy Session Note  Patient Details  Name: Sheryl James MRN: [id number] Date of Birth: 23-Apr-1955  Today's Date: 01/06/2018 OT Individual Time: 0930-1015 OT Individual Time Calculation (min): 45 min    Short Term Goals: Week 2:  OT Short Term Goal 1 (Week 2): STGs = LTGs  Skilled Therapeutic Interventions/Progress Updates:    Pt and spouse seen today for family education on safe mobility/ ambulation, facilitating pt's independence with self care and emphasizing single task focus.  Pt's husband needed cuing as he tends to lead pt by her hands vs her hips which restricts pt from being able to use her hands for safe sit><stand and transfers.  Pt's husband was definitely able to guide her more appropriately with practice. He tends to jump in and try to help quickly, so this session was a good opportunity for him to see what she could do.  He guided her to the bathroom to undress from chair in BR, transfer to shower.  He was able to see how quickly she can have a LOB and why his guarding hands are essential.  Discussed shower set up and recommended pt side step into shower vs entering forward.  In standing to dress, pt leans back through heels and leans upper torso back.   She tends to stand up and start pulling pants up which causes her to lose her balance.  Recommending use of RW for pt to stand, ensure balance is stable and then pull pants up. Spouse practiced walking with her in the room with the RW.  Spouse in room with pt and telesitter on.   Therapy Documentation Precautions:  Precautions Precautions: Fall Precaution Comments:   Restrictions Weight Bearing Restrictions: No       Pain: Pain Assessment Pain Score: 0-No pain ADL: ADL Eating: Not assessed Grooming: Contact guard Where Assessed-Grooming: Standing at sink Upper Body Bathing: Supervision/safety, Moderate cueing Where Assessed-Upper Body Bathing: Shower Lower Body Bathing: Contact guard, Moderate  cueing Where Assessed-Lower Body Bathing: Shower Upper Body Dressing: Supervision/safety, Minimal cueing Where Assessed-Upper Body Dressing: Sitting at sink Lower Body Dressing: Minimal assistance, Minimal cueing Where Assessed-Lower Body Dressing: Standing at sink, Sitting at sink Toileting: Contact guard Where Assessed-Toileting: Glass blower/designer: Psychiatric nurse Method: Arts development officer: Energy manager: Environmental education officer Method: Radiographer, therapeutic: Shower seat with back, Grab bars ADL Comments: Most cuing required for initiation and sustained attention    Therapy/Group: Individual Therapy  [place] 01/06/2018, 12:19 PM

## 2018-01-06 NOTE — Discharge Summary (Signed)
Physician Discharge Summary  Patient ID: Sheryl James MRN: [id number] DOB/AGE: August 14, 1955 62 y.o.  Admit date: 11/26/2017 Discharge date: 01/06/2018  Admission Diagnoses:Subarachnoid hemorrhage due to ruptured aneurysm, basilar apex aneurysm  Discharge Diagnoses: same Obstructive hydrocephalus Active Problems:   Subarachnoid hemorrhage due to ruptured aneurysm [facility])   Discharged Condition: good  Hospital Course: Sheryl James was admitted for an acute subarachnoid hemorrhage due to a ruptured basilar tip aneurysm. She was coiled soon after admission. I also placed a ventricular catheter for csf drainage. Initially she was non verbal, moving all extremities. Sheryl James had great difficulty with voiding, needing multiple foley catheters placed, and extensive in and out catheterization during the hospitalization. The ventricular catheter was replaced on two occasions, however she eventually was able to maintain normal mental function without being drained. At discharge she is alert, following commands, has normal speech. Moving all extremities.   Treatments: surgery: endovascular coiling basilar apex aneurysm Ventricular catheter placement for obstructive hydrocephalus  Discharge Exam: Blood pressure 125/84, pulse 78, temperature 98 F (36.7 C), temperature source Oral, resp. rate (!) 22, height 5\' 2"  (1.575 m), weight 70.8 kg, SpO2 95 %. General appearance: alert, cooperative, appears stated age and no distress  Disposition:  emergency  Allergies as of 12/26/2017   No Known Allergies     Medication List    ASK your doctor about these medications   ibuprofen 200 MG tablet Commonly known as:  ADVIL,MOTRIN Take 600 mg by mouth every 6 (six) hours as needed for headache or moderate pain.        Signed: Aaryn Sermon L 01/06/2018, 11:02 AM

## 2018-01-06 NOTE — Progress Notes (Signed)
Physical Therapy Session Note  Patient Details  Name: Sheryl James MRN: [id number] Date of Birth: 19-Dec-1955  Today's Date: 01/06/2018 PT Individual Time: 0845-0930 PT Individual Time Calculation (min): 45 min   Short Term Goals: Week 2:  PT Short Term Goal 1 (Week 2): STG =LTG due to ELOS  Skilled Therapeutic Interventions/Progress Updates:    Pt received supine in bed, agreeable to PT. No complaints of pain. Bed mobility Supervision. Sit to stand with CGA and no AD, initially retropulsive and able to correct with manual and verbal cues for anterior weight shift. Ambulation to bathroom with hand-held assist. Toilet transfer with CGA. Pt is able to complete 3/3 toileting steps with CGA. Ambulation 2 x 200 ft with RW and CGA for balance while engaging in cognitive tasks counting by 2's and searching for 3 red objects. Pt is able to count by 2's accurately with gait with decreased gait speed and v/c to keep head up during gait. Pt is only able to recall 2/3 red objects that she found while ambulating and needs more cues to attend to this cognitive task. Ascend/descend 4 stairs with 2 handrails and CGA. Pt left seated in recliner in room with needs in reach, quick release belt and chair alarm in place.  Therapy Documentation Precautions:  Precautions Precautions: Fall Precaution Comments:   Restrictions Weight Bearing Restrictions: No   Therapy/Group: Individual Therapy   Excell Seltzer, PT, DPT  01/06/2018, 10:13 AM

## 2018-01-06 NOTE — Progress Notes (Signed)
Speech Language Pathology Daily Session Note  Patient Details  Name: Sheryl James MRN: [id number] Date of Birth: 1956/01/19  Today's Date: 01/06/2018 SLP Individual Time: 1050-1120 SLP Individual Time Calculation (min): 30 min  Short Term Goals: Week 2: SLP Short Term Goal 1 (Week 2): STG=LTG due to remaining length of stay   Skilled Therapeutic Interventions:  Pt was seen for skilled ST targeting cognitive goals.  Pt utilized her memory notebook to recall activities from previous therapy sessions with supervision question cues.  SLP facilitated the session with a mildly complex deductive reasoning task to address functional problem solving goals.  Pt needed mod assist verbal cues for task organization as well as working memory to organize information into a chart based on provided clues.  Task was not finished today due to time constraints.  Will plan to finish tomorrow.  Pt was returned to room and left in wheelchair with family at bedside.  Continue per current plan of care.    Pain Pain Assessment Pain Scale: 0-10 Pain Score: 0-No pain  Therapy/Group: Individual Therapy  Deion Forgue, Selinda Orion 01/06/2018, 12:38 PM

## 2018-01-06 NOTE — Progress Notes (Signed)
Physical Therapy Session Note  Patient Details  Name: Sheryl James MRN: [id number] Date of Birth: 1955-04-21  Today's Date: 01/06/2018 PT Individual Time: 7408-1448 PT Individual Time Calculation (min): 55 min   Short Term Goals: Week 2:  PT Short Term Goal 1 (Week 2): STG =LTG due to ELOS  Skilled Therapeutic Interventions/Progress Updates: Pt presented in w/c with family present agreeable to therapy. Performed STS with CGA and ambulated CGA without AD to rehab gym with verbal cues for increasing BOS and step length. Although pt turned head and was distracted periodically pt did not have LOB during ambulation. Participated in side stepping with HHA, with emphasis on maintaining straight trajectory and to correct posterior LOB when noted. Pt required mod cues to increase anterior wt shifting when posterior lean noted. Pt returned to rehab gym and performed dynamic balance stepping over threshold with increased anterior wt shift and to correct posterior lean when placing feet back together. Pt required cues for correction approx 50% of time. Participated in static balance via peg board while standing on Airex, pt with decreased awareness with posterior lean while participating on peg board with PTA removing peg board to have pt focus on posterior lean correction. Participated in alternating to taps to 6in step and to air disk for dynamic balance with pt able to perform 8/10 steps without posterior LOB. Pt then handed off to OT in rehab gym with family present.      Therapy Documentation Precautions:  Precautions Precautions: Fall Precaution Comments:   Restrictions Weight Bearing Restrictions: No General:   Vital Signs: Therapy Vitals Temp: 97.7 F (36.5 C) Temp Source: Oral Pulse Rate: 73 Resp: 18 BP: (!) 122/54 Patient Position (if appropriate): Sitting Oxygen Therapy SpO2: 100 % O2 Device: Room Air Pain: Pain Assessment Pain Scale: 0-10 Pain Score: 0-No pain Faces Pain  Scale: Hurts a little bit Pain Type: Chronic pain Pain Location: Back Pain Orientation: Lower Pain Descriptors / Indicators: Aching Pain Onset: With Activity Pain Intervention(s): Repositioned    Therapy/Group: Individual Therapy  Kenric Ginger  Kayceon Oki, PTA  01/06/2018, 3:57 PM

## 2018-01-06 NOTE — Progress Notes (Signed)
Occupational Therapy Session Note  Patient Details  Name: Sheryl James MRN: [id number] Date of Birth: Jan 09, 1956  Today's Date: 01/06/2018 OT Individual Time: 1400-1445 OT Individual Time Calculation (min): 45 min    Short Term Goals: Week 2:  OT Short Term Goal 1 (Week 2): STGs = LTGs  Skilled Therapeutic Interventions/Progress Updates:    Pt completed functional mobility from the therapy gym to the ADL kitchen for simple meal prep.  Constant min assist needed for balance during task.  She was able to gather items needed in preparation of macaroni and cheese with mod questioning cueing.  Mod instructional cueing needed to follow directions on the box to prepare it, as she would frequently attempt to complete tasks out of order.  She attempted to place noodles in the water instead of waiting for it to boil and then attempted to place butter in the bowling water before the noodles.  She was able to recall the need to turn the stove off when she finished however.  Mod instructional cueing to not place warm/hot pot down on the counter top as well as remembering to put milk in with the butter and powdered cheese to finish the dish.  Finished session with return to the room after seated rest break.  Discussed session with sister-in-law and pt's spouse.  Pt left in room with family present working on memory book for session.    Therapy Documentation Precautions:  Precautions Precautions: Fall Precaution Comments:   Restrictions Weight Bearing Restrictions: No  Pain: Pain Assessment Pain Scale: 0-10 Pain Score: 0-No pain Faces Pain Scale: Hurts a little bit Pain Type: Chronic pain Pain Location: Back Pain Orientation: Lower Pain Descriptors / Indicators: Aching Pain Onset: With Activity Pain Intervention(s): Repositioned ADL:  Therapy/Group: Individual Therapy  Skylor Hughson OTR/L  01/06/2018, 3:45 PM

## 2018-01-06 NOTE — Progress Notes (Signed)
[facility] PHYSICAL MEDICINE & REHABILITATION PROGRESS NOTE  Subjective/Complaints: Patient seen sitting up in bed this morning.  Husband at bedside.  She states she slept well overnight.  She denies complaints.  ROS: Denies CP, SOB, N/V/D  Objective: Vital Signs: Blood pressure 115/76, pulse 74, temperature 98.2 F (36.8 C), temperature source Oral, resp. rate 12, SpO2 94 %. No results found. No results for input(s): WBC, HGB, HCT, PLT in the last 72 hours. No results for input(s): NA, K, CL, CO2, GLUCOSE, BUN, CREATININE, CALCIUM in the last 72 hours.   Physical Exam: BP 115/76 (BP Location: Right Arm)   Pulse 74   Temp 98.2 F (36.8 C) (Oral)   Resp 12   SpO2 94%  Constitutional: No distress . Vital signs reviewed. HENT: Normocephalic.  Atraumatic. Eyes: EOMI. No discharge. Cardiovascular: RRR.  No JVD. Respiratory: CTA bilaterally.  Normal effort. GI: BS +. Non-distended. Musc: No edema or tenderness in extremities. Neurological: She isalert and oriented x3, except for date Processing delays.  Motor:  RUE/RLE: 4+/5 proximal distal LUE/LLE: 4-4+/5 proximal to distal Skin: She isnot diaphoretic.  See above Psychiatric: Flat  Assessment/Plan: 1. Functional deficits secondary to bilateral SAH status post ventriculostomy which require 3+ hours per day of interdisciplinary therapy in a comprehensive inpatient rehab setting.  Physiatrist is providing close team supervision and 24 hour management of active medical problems listed below.  Physiatrist and rehab team continue to assess barriers to discharge/monitor patient progress toward functional and medical goals  Care Tool:  Bathing    Body parts bathed by patient: Right arm, Left arm, Chest, Abdomen, Front perineal area, Buttocks, Right upper leg, Left upper leg, Right lower leg, Left lower leg, Face         Bathing assist Assist Level: Contact Guard/Touching assist     Upper Body Dressing/Undressing Upper  body dressing Upper body dressing/undressing activity did not occur (including orthotics): N/A What is the patient wearing?: Pull over shirt, Bra    Upper body assist Assist Level: Supervision/Verbal cueing    Lower Body Dressing/Undressing Lower body dressing    Lower body dressing activity did not occur: N/A What is the patient wearing?: Underwear/pull up, Pants     Lower body assist Assist for lower body dressing: Contact Guard/Touching assist     Toileting Toileting Toileting Activity did not occur (Clothing management and hygiene only): N/A (no void or bm)  Toileting assist Assist for toileting: Minimal Assistance - Patient > 75%     Transfers Chair/bed transfer  Transfers assist     Chair/bed transfer assist level: Supervision/Verbal cueing     Locomotion Ambulation   Ambulation assist      Assist level: Contact Guard/Touching assist Assistive device: Walker-rolling Max distance: 244ft   Walk 10 feet activity   Assist     Assist level: Contact Guard/Touching assist Assistive device: Walker-rolling   Walk 50 feet activity   Assist    Assist level: Contact Guard/Touching assist Assistive device: Walker-rolling    Walk 150 feet activity   Assist Walk 150 feet activity did not occur: Safety/medical concerns  Assist level: Contact Guard/Touching assist Assistive device: Walker-rolling    Walk 10 feet on uneven surface  activity   Assist Walk 10 feet on uneven surfaces activity did not occur: Safety/medical concerns     Assistive device: Aeronautical engineer   Type of Wheelchair: Manual    Wheelchair assist level: Minimal Assistance - Patient > 75% Max wheelchair  distance: 30 ft    Wheelchair 50 feet with 2 turns activity    Assist    Wheelchair 50 feet with 2 turns activity did not occur: Safety/medical concerns       Wheelchair 150 feet activity     Assist Wheelchair 150 feet activity did  not occur: Safety/medical concerns          Medical Problem List and Plan: 1.Decreased functional mobility with cognitive deficitssecondary to bilateral medial frontal/temporalsubarachnoid hemorrhage from aneurysmal bleed. Pt isstatus post basilar tip aneurysm coiling  Cont CIR  2. DVT Prophylaxis/Anticoagulation: SCDs 3. Pain Management:Ultram DC'd, Tylenol as needed 4. Mood:Provide emotional support 5. Neuropsych: This patientisnot capable of making decisions on herown behalf. 6. Skin/Wound Care:Routine skin checks 7. Fluids/Electrolytes/Nutrition:Routine in and outs  BMP within normal limits on 11/4 8.Seizure prophylaxis. Keppra 500 mg twice daily, DC on 11/12, contacted neurosurgery, continue to await further recs from neurosurgery  9.Urinary retention.   Urecholine 3 times daily  Flomax 0.4 mg daily, increased on 11/11.   Foley DC'd on 11/4  Improving overall 10.Diarrhea. Improved. Continue Imodium as needed as well as Florastor -encourage PO 11.Tobacco abuse. Provide counseling 12.Pseudomonas UTI. Completedcourse of Cipro. 13.  Hypoalbuminemia  Supplement initiated on 11/4 14.  Hypertension  Lisinopril 2.5 started on 11/5   Vitals:   01/05/18 2010 01/06/18 0437  BP: (!) 142/68 115/76  Pulse: 83 74  Resp: 12 12  Temp: 98 F (36.7 C) 98.2 F (36.8 C)  SpO2: 95% 94%   Labile on 11/12  LOS: 11 days A FACE TO FACE EVALUATION WAS PERFORMED  Ankit Lorie Phenix 01/06/2018, 8:53 AM

## 2018-01-07 ENCOUNTER — Inpatient Hospital Stay (HOSPITAL_COMMUNITY): Payer: 59 | Admitting: Physical Therapy

## 2018-01-07 ENCOUNTER — Inpatient Hospital Stay (HOSPITAL_COMMUNITY): Payer: 59 | Admitting: Speech Pathology

## 2018-01-07 ENCOUNTER — Inpatient Hospital Stay (HOSPITAL_COMMUNITY): Payer: 59 | Admitting: Occupational Therapy

## 2018-01-07 NOTE — Progress Notes (Signed)
Occupational Therapy Discharge Summary  Patient Details  Name: Sheryl James MRN: [id number] Date of Birth: [birth year]  Today's Date: 01/07/2018 OT Individual Time: 1025-8527 OT Individual Time Calculation (min): 34 min    Session Note:  Began session by having her review what she had just completed in SLP session.  She needed min assist to complete this and write it down in her memory notebook.  Next had her look at a list of 5 items to be gathered from the pantry down in the ADL kitchen.  She was able to look at the list for 30 seconds to 1 minute and memorize it.  Next had pt walk down to the ADL kitchen and work on finding the items she had to memorize.  Min assist needed for balance with mobility and no assistive device.  She was able to find and recall 4/5 items without cueing, but then added in an extra item that was not on the list.  When asked to determine which item was not on the list, she could not state it.  When attempting to remember and locate the last item, she was able to recall it was a liquid and a juice but could not determine it was apple juice from looking in the pantry.  Therapist had to give mod questioning cueing to determine it.  Finished session with practicing of toilet transfers without use of UEs to push up from as she may not have this at home.  She could complete sit to stand from the toilet with supervision.  Ambulated back to the room with min assist to complete session with pt writing down in her memory notebook what she had to do during session and also 3/5 items she had to locate.  Mod questioning cueing to recall the other two.  Discussed with spouse measures to put in place at night to help keep pt from getting up out of the bed without assistance such as an alarm pad or using a belt around her and then fixed around him so that he will wake up if she tries to get up.    Patient has met 15 of 16  long term goals due to improved activity tolerance, improved balance,  ability to compensate for deficits, improved attention and improved awareness.  Patient to discharge at [facility] Assist level.  Patient's care partner is independent to provide the necessary physical and cognitive assistance at discharge.    Reasons goals not met: Pt needs mod to max assist for awareness of safety  Recommendation:  Patient will benefit from ongoing skilled OT services in home health setting to continue to advance functional skills in the area of BADL and Reduce care partner burden.  Pt continues to need min assist for dynamic standing balance during functional transfers and ADL tasks.  She also demonstrates decreased memory and cognitive processing as well as decreased awareness of safety.  Feel based on these issues she will continue to benefit from further OT via home health to progress cognitively and physically in order to reach modified independent level.    Equipment: No equipment provided  Reasons for discharge: treatment goals met and discharge from hospital  Patient/family agrees with progress made and goals achieved: Yes  OT Discharge Precautions/Restrictions  Precautions Precautions: Fall Restrictions Weight Bearing Restrictions: No  Pain Pain Assessment Pain Scale: 0-10 Pain Score: 0-No pain ADL ADL Eating: Independent Where Assessed-Eating: Wheelchair Grooming: Supervision/safety Where Assessed-Grooming: Standing at sink Upper Body Bathing: Supervision/safety Where Assessed-Upper Body  Bathing: Retail buyer Bathing: Supervision/safety Where Assessed-Lower Body Bathing: Shower Upper Body Dressing: Setup Where Assessed-Upper Body Dressing: Sitting at sink Lower Body Dressing: Supervision/safety Where Assessed-Lower Body Dressing: Sitting at sink Toileting: Contact guard Where Assessed-Toileting: Glass blower/designer: Psychiatric nurse Method: Counselling psychologist: Ambulance person Transfer: Librarian, academic Method: Optometrist: Civil engineer, contracting with back Social research officer, government: Environmental education officer Method: Heritage manager: Shower seat with back, Grab bars ADL Comments: Most cuing required for initiation and sustained attention  Vision Baseline Vision/History: Wears glasses Wears Glasses: At all times Patient Visual Report: No change from baseline Vision Assessment?: No apparent visual deficits Perception  Perception: Within Functional Limits Praxis Praxis: Impaired Praxis Impairment Details: Motor planning Praxis-Other Comments: some deficits with complex motor planning tasks Cognition Overall Cognitive Status: Impaired/Different from baseline Arousal/Alertness: Awake/alert Orientation Level: Oriented X4 Attention: Sustained Focused Attention: Appears intact Sustained Attention: Impaired Sustained Attention Impairment: Functional basic;Verbal basic Selective Attention: Impaired Selective Attention Impairment: Functional basic Memory: Impaired Memory Impairment: Storage deficit;Retrieval deficit Awareness: Impaired Awareness Impairment: Emergent impairment;Anticipatory impairment Problem Solving: Impaired Problem Solving Impairment: Verbal basic;Functional basic Executive Function: (all impaired due to lower level deficits ) Initiating: Appears intact Behaviors: Impulsive Safety/Judgment: Impaired Comments: Pt still demonstrates decreased memory from session to session, with requirement of a memory notebook for recall of daily tasks.   Sensation Sensation Light Touch: Appears Intact Hot/Cold: Appears Intact Proprioception: Appears Intact Stereognosis: Appears Intact Coordination Gross Motor Movements are Fluid and Coordinated: Yes Fine Motor Movements are Fluid and Coordinated: Yes Coordination and Movement Description: Slowed gross motor movements but coordinated Finger Nose Finger  Test: diminished on L Heel Shin Test: WFL B Motor  Motor Motor: Motor apraxia Motor - Discharge Observations: Generalized weakness with delayed balance reactions. Mobility  Transfers Sit to Stand: Supervision/Verbal cueing  Trunk/Postural Assessment  Cervical Assessment Cervical Assessment: Exceptions to WFL(cervical protraction) Thoracic Assessment Thoracic Assessment: Exceptions to WFL(thoracic kyphosis present in sitting and standing) Lumbar Assessment Lumbar Assessment: Exceptions to WFL(posterior pelvic tilt in sitting) Postural Control Postural Control: Deficits on evaluation(occasional posterior LOB with standing balance, especially when fatigued; improved from time of eval) Righting Reactions: delayed posteriorly and laterally with turns Protective Responses: Delayed Posteriorly   Balance Balance Balance Assessed: Yes Static Sitting Balance Static Sitting - Balance Support: Feet supported Static Sitting - Level of Assistance: 6: Modified independent (Device/Increase time) Dynamic Sitting Balance Dynamic Sitting - Balance Support: Feet supported Dynamic Sitting - Level of Assistance: 6: Modified independent (Device/Increase time) Static Standing Balance Static Standing - Balance Support: Bilateral upper extremity supported Static Standing - Level of Assistance: 5: Stand by assistance Dynamic Standing Balance Dynamic Standing - Balance Support: During functional activity Dynamic Standing - Level of Assistance: 4: Min assist Extremity/Trunk Assessment RUE Assessment RUE Assessment: Within Functional Limits LUE Assessment LUE Assessment: Within Functional Limits   Yolette Hastings OTR/L 01/07/2018, 4:25 PM

## 2018-01-07 NOTE — Progress Notes (Signed)
Physical Therapy Session Note  Patient Details  Name: Sheryl James MRN: [id number] Date of Birth: Jul 04, 1955  Today's Date: 01/07/2018 PT Individual Time: 6016-5800 and 1301-1330  PT Individual Time Calculation (min): 30 min and 29 min  Short Term Goals: Week 2:  PT Short Term Goal 1 (Week 2): STG =LTG due to ELOS  Skilled Therapeutic Interventions/Progress Updates: Tx1: Pt presented in recliner agreeable to therapy and denies pain. Pt requires assistance to decrease posterior lean upon standing. Pt ambulated CGA with intermitted mild LOB requiring HHA for correction from PTA while ambulating to ADL apt. Performed furniture transfer and bed mobility in ADL apt. Ambulated to rehab gym and participated in static balance on [employer] connecting beetle bugs. Pt was able to connect x 6 bugs with x1 verbal cue for correcting posterior lean. Pt ambulated back to room and returned to w/c at end of session with needs met.   Tx2: Pt presented in recliner with family present agreeable to therapy. Pt ambulated to ortho gym CGA no AD and performed step onto compliant surface. Pt with x 1 LOB requiring minA from PTA for recovery. Pt then ambulated to day room CGA and participated in Nustep L2 x11 min for global strengthening and endurance. Pt performed ambulatory transfer to w/c and pt transported back to room. In room pt requesting to use toilet. Performed ambulatory transfer to bathroom and pt was able to perform toilet transfer with CGA. Upon standing pt with posterior LOB requiring minA from PTA for recovery. Pt returned to recliner and left with seat alarm on and needs met.       Therapy Documentation Precautions:  Precautions Precautions: Fall Precaution Comments:   Restrictions Weight Bearing Restrictions: No    Therapy/Group: Individual Therapy  Marta Bouie  Adonna Horsley, PTA  01/07/2018, 4:11 PM

## 2018-01-07 NOTE — Progress Notes (Signed)
Speech Language Pathology Discharge Summary  Patient Details  Name: Sheryl James MRN: [id number] Date of Birth: Sep 03, 1955  Today's Date: 01/07/2018 SLP Individual Time: 1405-1500 SLP Individual Time Calculation (min): 55 min   Skilled Therapeutic Interventions:  Pt was seen for skilled ST targeting cognitive goals.  Pt completed deductive reasoning puzzle from yesterday's therapy session with mod I for 100% accuracy; however, task was mostly completed yesterday and pt was able to complete task within ~5 minutes.  SLP facilitated the session with a novel scheduling task targeting problem solving goals.  Pt needed min-mod assist to complete task for 6/7 accuracy but needed total assist to recognize and correct remaining error.  When asked if pt had any concerns about discharge, pt stated that she was worried her dog might knock her over when she gets home.  Pt needed min cues to generate an appropriate solution to the abovementioned safety concern due to decreased awareness of her current limitations.  Reviewed and reinforced recommendations with pt's husband, emphasizing memory and attention compensatory strategies, the need for 24/7 supervision at discharge, as well as assistance for medication and financial management.  All questions were answered to pt's and husband's satisfaction at this time.  Pt was handed off to OT with husband at bedside.  Pt is ready for discharge tomorrow.      Patient has met 4 of 5 long term goals.  Patient to discharge at overall Min;Mod level.  Reasons goals not met:     Clinical Impression/Discharge Summary:   Pt has made functional gains while inpatient and is discharging having met 4 out of 5 short term goals.  Pt is currently min-mod assist for basic tasks due to moderate cognitive deficits characterized by slow processing speed, decreased sustained attention to tasks, decreased problem solving, decreased awareness of deficits, and decreased recall of information.   Pt and family education is complete at this time.  Pt has demonstrated improved use of memory compensatory strategies.  Pt is discharging home with recommendations for 24/7 supervision at discharge and ST follow up at next level of care.      Care Partner:  Caregiver Able to Provide Assistance: Yes  Type of Caregiver Assistance: Physical;Cognitive  Recommendation:  Outpatient SLP;Home Health SLP;24 hour supervision/assistance  Rationale for SLP Follow Up: Maximize cognitive function and independence;Reduce caregiver burden   Equipment: none recommended by SLP    Reasons for discharge: Discharged from hospital   Patient/Family Agrees with Progress Made and Goals Achieved: Yes    Xitlalic Maslin, Selinda Orion 01/07/2018, 4:04 PM

## 2018-01-07 NOTE — Discharge Summary (Signed)
Discharge summary job [id number]

## 2018-01-07 NOTE — Discharge Summary (Addendum)
NAME: Sheryl James, Sheryl James MEDICAL RECORD [id number] ACCOUNT [id number] DATE OF BIRTH:26-Aug-1955 FACILITY: [facility] LOCATION: [facility] PHYSICIAN:Maxyne Derocher, MD  DISCHARGE SUMMARY  DATE OF DISCHARGE:  01/08/2018  DISCHARGE DIAGNOSES: 1.  Bilateral medial frontotemporal subarachnoid hemorrhage from an aneurysmal bleed. 2.  Sequential compression devices for deep venous thrombosis prophylaxis. 3.  Pain management. 4.  Seizure prophylaxis.   5.  Urinary retention.   6.  Tobacco abuse.   7.  Pseudomonas urinary tract infection. 8.  Hypertension.  HOSPITAL COURSE:  This is a 62 year old right-handed female with history of tobacco abuse, on no prescription medication, who lives with spouse, independent prior to admission.  Presented 11/26/2017 with persistent headaches over the previous few weeks,  associated nausea, no vomiting.  She had initially been seen at [facility] a few weeks prior.  Workup negative.  She was discharged to home.  During workup noted hypertensive and advised to follow up with her PCP.  She went to [facility] due  to persistent headaches, dizziness placed on antibiotic treatment for possible otitis media.  On day of admission 11/26/2017, patient became unresponsive while eating breakfast, jerking movements lasting 20-30 seconds.  Cranial CT scan showed  subarachnoid hemorrhage tracking in the midline of each frontal lobes as well as along the middle cerebral artery distribution tracks bilaterally.  Mild hemorrhage layering and each lateral ventricle.  Prior small infarcts in the head of the caudate  nucleus on the right and left thalamus.  There was an appearance concerning for possible aneurysm rupture at the level of the anterior communicating artery.  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  Underwent right frontal ventricular catheter placement per Dr. Christella Noa.  Diagnostic cerebral  angiogram showed a 10.7 x 6 x 6 mm basilar tip apex aneurysm that was coiled; no significant vasospasm seen.  Followup CT scan  showed continue enlargement ventricles.  underwent ventriculostomy drain in the right lateral ventricle 12/06/2017.    Urine study 100,000 Pseudomonas.  Placed on Cipro persistent diarrhea.  Follow up Gastroenterology.  C. diff negative.  Bowel program regulated remained on Keppra for seizure prophylaxis.  Intermittent bouts of urinary retention.  Maintained on  Urecholine as well as Flomax.  Therapy evaluations completed and patient was admitted for comprehensive rehabilitation program.  PAST MEDICAL HISTORY:  See discharge diagnoses.  SOCIAL HISTORY:  Lives with spouse, independent prior to admission.  FUNCTIONAL STATUS:  Upon admission to rehab services was min mod assist 200 feet rolling walker, minimal guard sit to stand, max total assist with activities of daily living.  PHYSICAL EXAMINATION: VITAL SIGNS:  Blood pressure 139/90, pulse 80, temperature 98, respirations. GENERAL:  Alert female, some delay in processing.  Mood was flat, but appropriate.  Had some difficulty answering biographical information. HEENT:  EOMs intact.  Scalp staples intact. CARDIOVASCULAR:  Rate controlled. ABDOMEN:  Soft, nontender, good bowel sounds. LUNGS:  Clear to auscultation without wheeze.  REHABILITATION HOSPITAL COURSE:  The patient was admitted to inpatient rehabilitation services.  Therapies initiated on a 3-hour daily basis, consisting of physical therapy, occupational therapy, speech therapy and rehabilitation nursing.  The following  issues were addressed during patient's rehabilitation stay:    Pertaining to the patient's bilateral medial frontotemporal subarachnoid hemorrhage.  She had undergone coiling with followup with neurosurgery.  SCDs for DVT prophylaxis.  Pain management with Tylenol.  Seizure prophylaxis.  She completed a course of Keppra.  No seizure  activity.    Bouts of urinary retention wean from Urecholine continued on Flomax.  No dysuria or hematuria.    Tobacco abuse.  Provided counts regards to cessation of nicotine products.    She did complete a course of Cipro for Pseudomonas UTI.    Blood pressure is controlled on lisinopril.    Intermittent bouts of diarrhea, much improved with the use of Florastor.    The patient received weekly collaborative interdisciplinary team conferences to discuss estimated length of stay, family teaching, any barriers to discharge.  She was ambulating 200 feet, rolling walker, contact guard assist.  She was able to count by  2's,  accurately with ambulation with decreased gait speed up and down scan stairs, contact guard assist.  Needed some assistance for balance.  Moderate instructional cueing needed to follow directions.  Followup for speech therapy.  Moderate assist for  task organization.  It was discussed the need for supervision at home.  Family teaching was completed.  DISCHARGE MEDICATIONS:  Included Urecholine 10 mg p.o. t.i.d. taper as directed, lisinopril 2.5 mg p.o. daily, Protonix 40 mg p.o. daily, Florastor 250 mg p.o. b.i.d., Flomax 0.8 mg p.o. daily, Tylenol as needed.  Her diet was regular.  She would follow up with Dr. Delice Lesch at the outpatient rehab service office as directed.  Dr. Ashok Pall, neurosurgery.  Case management arranging for PCP.  SPECIAL INSTRUCTIONS:  No driving or smoking.  AN/NUANCE D:01/07/2018 T:01/07/2018 JOB:[id number]  Patient seen and examined by me on day of discharge. Delice Lesch, MD, ABPMR

## 2018-01-07 NOTE — Progress Notes (Signed)
[facility] PHYSICAL MEDICINE & REHABILITATION PROGRESS NOTE  Subjective/Complaints: Patient seen sitting up in her chair this morning.  Husband at bedside.  She states she slept well overnight.  She is looking forward to discharge tomorrow.  Husband with questions regarding discharge.  ROS: Denies CP, SOB, N/V/D  Objective: Vital Signs: Blood pressure 135/68, pulse 78, temperature 97.9 F (36.6 C), temperature source Oral, resp. rate 17, SpO2 99 %. No results found. No results for input(s): WBC, HGB, HCT, PLT in the last 72 hours. No results for input(s): NA, K, CL, CO2, GLUCOSE, BUN, CREATININE, CALCIUM in the last 72 hours.   Physical Exam: BP 135/68 (BP Location: Right Arm)   Pulse 78   Temp 97.9 F (36.6 C) (Oral)   Resp 17   SpO2 99%  Constitutional: No distress . Vital signs reviewed. HENT: Normocephalic.  Atraumatic. Eyes: EOMI. No discharge. Cardiovascular: RRR.  No JVD. Respiratory: CTA bilaterally.  Normal effort. GI: BS +. Non-distended. Musc: No edema or tenderness in extremities. Neurological: She isalert and oriented x3 Processing delays.  Motor:  RUE/RLE: 4+/5 proximal distal, stable  LUE/LLE: 4-4+/5 proximal to distal, stable Skin: She isnot diaphoretic.  See above Psychiatric: Flat  Assessment/Plan: 1. Functional deficits secondary to bilateral SAH status post ventriculostomy which require 3+ hours per day of interdisciplinary therapy in a comprehensive inpatient rehab setting.  Physiatrist is providing close team supervision and 24 hour management of active medical problems listed below.  Physiatrist and rehab team continue to assess barriers to discharge/monitor patient progress toward functional and medical goals  Care Tool:  Bathing    Body parts bathed by patient: Right arm, Left arm, Chest, Abdomen, Front perineal area, Buttocks, Right upper leg, Left upper leg, Right lower leg, Left lower leg, Face         Bathing assist Assist Level:  Contact Guard/Touching assist     Upper Body Dressing/Undressing Upper body dressing Upper body dressing/undressing activity did not occur (including orthotics): N/A What is the patient wearing?: Pull over shirt, Bra    Upper body assist Assist Level: Supervision/Verbal cueing    Lower Body Dressing/Undressing Lower body dressing    Lower body dressing activity did not occur: N/A What is the patient wearing?: Underwear/pull up, Pants     Lower body assist Assist for lower body dressing: Contact Guard/Touching assist     Toileting Toileting Toileting Activity did not occur (Clothing management and hygiene only): N/A (no void or bm)  Toileting assist Assist for toileting: Minimal Assistance - Patient > 75%     Transfers Chair/bed transfer  Transfers assist     Chair/bed transfer assist level: Contact Guard/Touching assist     Locomotion Ambulation   Ambulation assist      Assist level: Contact Guard/Touching assist Assistive device: Walker-rolling Max distance: 30'   Walk 10 feet activity   Assist     Assist level: Contact Guard/Touching assist Assistive device: Walker-rolling   Walk 50 feet activity   Assist    Assist level: Contact Guard/Touching assist Assistive device: Walker-rolling    Walk 150 feet activity   Assist Walk 150 feet activity did not occur: Safety/medical concerns  Assist level: Contact Guard/Touching assist Assistive device: Walker-rolling    Walk 10 feet on uneven surface  activity   Assist Walk 10 feet on uneven surfaces activity did not occur: Safety/medical concerns     Assistive device: Aeronautical engineer   Type of Wheelchair: Manual  Wheelchair assist level: Minimal Assistance - Patient > 75% Max wheelchair distance: 30 ft    Wheelchair 50 feet with 2 turns activity    Assist    Wheelchair 50 feet with 2 turns activity did not occur: Safety/medical concerns        Wheelchair 150 feet activity     Assist Wheelchair 150 feet activity did not occur: Safety/medical concerns          Medical Problem List and Plan: 1.Decreased functional mobility with cognitive deficitssecondary to bilateral medial frontal/temporalsubarachnoid hemorrhage from aneurysmal bleed. Pt isstatus post basilar tip aneurysm coiling  Cont CIR   Plan for d/c tomorrow  Will see patient for transitional care management in 1-2 weeks post-discharge  Team conference today to discuss current and goals and coordination of care, home and environmental barriers, and discharge planning with nursing, case manager, and therapies.  2. DVT Prophylaxis/Anticoagulation: SCDs 3. Pain Management:Ultram DC'd, Tylenol as needed 4. Mood:Provide emotional support 5. Neuropsych: This patientisnot capable of making decisions on herown behalf. 6. Skin/Wound Care:Routine skin checks 7. Fluids/Electrolytes/Nutrition:Routine in and outs  BMP within normal limits on 11/4 8.Seizure prophylaxis. Keppra 500 mg twice daily, DCed on 11/12, contacted neurosurgery, continue to await further recs from neurosurgery  9.Urinary retention.   Urecholine 3 times daily  Flomax 0.4 mg daily, increased on 11/11.   Foley DC'd on 11/4  Improving overall 10.Diarrhea. Improved. Continue Imodium as needed as well as Florastor -encourage PO 11.Tobacco abuse. Provide counseling 12.Pseudomonas UTI. Completedcourse of Cipro. 13.  Hypoalbuminemia  Supplement initiated on 11/4 14.  Hypertension  Lisinopril 2.5 started on 11/5   Vitals:   01/06/18 1945 01/07/18 0330  BP: (!) 120/49 135/68  Pulse: 66 78  Resp: 16 17  Temp: 98.3 F (36.8 C) 97.9 F (36.6 C)  SpO2: 98% 99%   Controlled on 11/13  LOS: 12 days A FACE TO FACE EVALUATION WAS PERFORMED  Ankit Lorie Phenix 01/07/2018, 9:41 AM

## 2018-01-07 NOTE — Progress Notes (Signed)
Physical Therapy Discharge Summary  Patient Details  Name: Sheryl James MRN: [id number] Date of Birth: [birth year]  Today's Date: 01/07/2018 PT Individual Time: 0905-1005 PT Individual Time Calculation (min): 60 min    Patient has met 10 of 10 long term goals due to improved activity tolerance, improved balance, improved postural control, increased strength, increased range of motion, ability to compensate for deficits, functional use of  right lower extremity, improved attention, improved awareness and improved coordination.  Patient to discharge at an ambulatory level Supervision.   Patient's care partner is independent to provide the necessary physical assistance at discharge.  Reasons goals not met: All PT goals met  Recommendation:  Patient will benefit from ongoing skilled PT services in home health setting to continue to advance safe functional mobility, address ongoing impairments in balance, endurance, LE strength, coordination, safety, and minimize fall risk.  Equipment: Conservation officer, nature  Reasons for discharge: treatment goals met and discharge from hospital  Patient/family agrees with progress made and goals achieved: Yes   PT Treatment: PT instructed pt in grad day assessment to measure progress toward long term goals. See below for more details.  Car transfer x1 with RW. Pt performed transfer with CGA from PT. Pt had increased difficulty self correcting posterior LOB today as compared to previous PT session. Verbal cues for AD and LE sequencing.   Prescribed and reviewed the following exercises for patient HEP: LAQ x5 Standing knee flexion x5 Mini squats x5 Standing hip abduction x5 Sit to stand with UE pushing through thighs x5 Verbal and tactile cues for technique/form. Pt instructed to perform standing exercises with B UE support and supervision.  Berg Balance Test issued. Score: 30/56. A score of <45 indicates a high fall risk.   PT  Discharge Precautions/Restrictions Precautions Precautions: Fall Restrictions Weight Bearing Restrictions: No Vital Signs Therapy Vitals BP: (!) 149/78 Pain Pain Assessment Pain Scale: 0-10 Pain Score: 0-No pain Vision/Perception  Perception Perception: Within Functional Limits Praxis Praxis: Impaired Praxis Impairment Details: Motor planning Praxis-Other Comments: some deficits with complex motor planning tasks  Cognition Orientation Level: Oriented X4 Sensation Sensation Light Touch: Appears Intact Coordination Gross Motor Movements are Fluid and Coordinated: Yes Fine Motor Movements are Fluid and Coordinated: No Coordination and Movement Description: Slowed gross motor movements but coordinated Finger Nose Finger Test: diminished on L Heel Shin Test: WFL B Motor  Motor Motor: Motor apraxia Motor - Discharge Observations: motor apraxia improved from time of eval  Mobility Transfers Transfers: Sit to Stand;Stand Pivot Transfers Sit to Stand: Supervision/Verbal cueing Stand Pivot Transfers: Supervision/Verbal cueing Stand Pivot Transfer Details: Verbal cues for precautions/safety;Verbal cues for technique Transfer (Assistive device): Rolling walker Locomotion  Gait Ambulation: Yes Gait Assistance: Supervision/Verbal cueing Gait Distance (Feet): 200 Feet Assistive device: Rolling walker Gait Assistance Details: Verbal cues for technique Gait Assistance Details: verbal cues to maintain head upright when walking Gait Gait: Yes Gait Pattern: Impaired Gait Pattern: Decreased step length - right;Decreased step length - left;Trunk flexed;Decreased trunk rotation Gait velocity: gait quality improved from time of eval Stairs / Additional Locomotion Stairs: Yes Stairs Assistance: Minimal Assistance - Patient > 75% Stair Management Technique: One rail Left(use of 1 rail ascending but needs 2 rails or min assist descending) Number of Stairs: 4 Height of Stairs:  6 Product manager Assistance: (Pt needs cuing for hand placement/propulsion and direction of WC to prevent veer to the R)  Trunk/Postural Assessment  Cervical Assessment Cervical Assessment: Exceptions to WFL(forward head ) Thoracic Assessment Thoracic Assessment:  Exceptions to WFL(Dowager's hump) Lumbar Assessment Lumbar Assessment: Exceptions to WFL(posterior pelvic tilt) Postural Control Postural Control: Deficits on evaluation(occasional posterior LOB with standing balance, especially when fatigued; improved from time of eval)  Balance Standardized Balance Assessment Standardized Balance Assessment: Berg Balance Test Berg Balance Test Sit to Stand: Able to stand without using hands and stabilize independently Standing Unsupported: Able to stand 2 minutes with supervision Sitting with Back Unsupported but Feet Supported on Floor or Stool: Able to sit safely and securely 2 minutes Stand to Sit: Sits safely with minimal use of hands Transfers: Able to transfer safely, minor use of hands Standing Unsupported with Eyes Closed: Able to stand 10 seconds with supervision Standing Ubsupported with Feet Together: Able to place feet together independently and stand for 1 minute with supervision From Standing, Reach Forward with Outstretched Arm: Reaches forward but needs supervision From Standing Position, Pick up Object from Floor: Able to pick up shoe, needs supervision From Standing Position, Turn to Look Behind Over each Shoulder: Needs supervision when turning Turn 360 Degrees: Needs assistance while turning Standing Unsupported, Alternately Place Feet on Step/Stool: Needs assistance to keep from falling or unable to try Standing Unsupported, One Foot in Front: Loses balance while stepping or standing Standing on One Leg: Unable to try or needs assist to prevent fall Total Score: 30 Static Sitting Balance Static Sitting - Balance Support: Feet supported Static Sitting -  Level of Assistance: 6: Modified independent (Device/Increase time) Dynamic Sitting Balance Dynamic Sitting - Balance Support: Feet supported Dynamic Sitting - Level of Assistance: 6: Modified independent (Device/Increase time) Static Standing Balance Static Standing - Balance Support: Bilateral upper extremity supported Static Standing - Level of Assistance: 5: Stand by assistance Dynamic Standing Balance Dynamic Standing - Balance Support: Bilateral upper extremity supported Dynamic Standing - Level of Assistance: 5: Stand by assistance Extremity Assessment      RLE Assessment RLE Assessment: Exceptions to [facility] Active Range of Motion (AROM) Comments: WFL RLE Strength RLE Overall Strength Comments: grossly 4+/5  LLE Assessment LLE Assessment: Exceptions to [facility] Active Range of Motion (AROM) Comments: [facility] Strength Comments: grossly 4+/5; improved from time of eval     Tavon Corriher 01/07/2018, 12:55 PM

## 2018-01-08 MED ORDER — PANTOPRAZOLE SODIUM 40 MG PO TBEC: 40.0000 mg | DELAYED_RELEASE_TABLET | Freq: Every day | ORAL | 0 refills | Status: DC

## 2018-01-08 MED ORDER — LISINOPRIL 2.5 MG PO TABS: 2.5000 mg | ORAL_TABLET | Freq: Every day | ORAL | 1 refills | Status: DC

## 2018-01-08 MED ORDER — SACCHAROMYCES BOULARDII 250 MG PO CAPS: 250.0000 mg | ORAL_CAPSULE | Freq: Two times a day (BID) | ORAL | 0 refills | Status: DC

## 2018-01-08 MED ORDER — ACETAMINOPHEN 325 MG PO TABS: 650.0000 mg | ORAL_TABLET | ORAL | Status: DC | PRN

## 2018-01-08 MED ORDER — TAMSULOSIN HCL 0.4 MG PO CAPS: 0.8000 mg | ORAL_CAPSULE | Freq: Every day | ORAL | 1 refills | Status: DC

## 2018-01-08 MED ORDER — BETHANECHOL CHLORIDE 10 MG PO TABS: 10.0000 mg | ORAL_TABLET | Freq: Three times a day (TID) | ORAL | 0 refills | Status: DC

## 2018-01-08 NOTE — Progress Notes (Signed)
Social Work  Discharge Note  The overall goal for the admission was met for:   Discharge location: Yes - home with spouse who is able to provide 24/7 assistance.  Length of Stay: Yes - 13 days  Discharge activity level: Yes - superivison/ light min assist  Home/community participation: Yes  Services provided included: MD, RD, PT, OT, SLP, RN, TR, Pharmacy and [place]: Private Insurance: [facility]  Follow-up services arranged: Home Health: PT, OT, ST via [place], DME: rolling walker via [place] and Patient/Family has no preference for HH/DME agencies  Comments (or additional information):  Patient/Family verbalized understanding of follow-up arrangements: Yes  Individual responsible for coordination of the follow-up plan: pt/ spouse  Confirmed correct DME delivered: Arriah Wadle 01/08/2018    Kailyn Dubie

## 2018-01-08 NOTE — Patient Care Conference (Signed)
Inpatient RehabilitationTeam Conference and Plan of Care Update Date: 01/07/2018   Time: 11:35    Patient Name: Sheryl James      Medical Record Number: [id number]  Date of Birth: Mar 10, 1955 Sex: Female         Room/Bed: 4M09C/4M09C-01 Payor Info: Payor: Theme park manager / Plan: [place] / Product Type: *No Product type* /    Admitting Diagnosis: SAH  Admit Date/Time:  12/26/2017  3:16 PM Admission Comments: No comment available   Primary Diagnosis:  <principal problem not specified> Principal Problem: <principal problem not specified>  Patient Active Problem List   Diagnosis Date Noted  . Cognitive deficit, post-stroke   . Hypertension   . Hypoalbuminemia due to protein-calorie malnutrition ([place])   . Urinary retention   . Seizure prophylaxis   . SAH (subarachnoid hemorrhage) ([place]) 12/26/2017  . Subarachnoid hemorrhage due to ruptured aneurysm ([place]) 11/26/2017    Expected Discharge Date: Expected Discharge Date: 01/08/18  Team Members Present: Physician leading conference: Dr. Delice Lesch Social Worker Present: Lennart Pall, LCSW Nurse Present: Rayetta Pigg, RN PT Present: Barrie Folk, PT;Rosita Dechalus, PTA OT Present: Clyda Greener, OT SLP Present: Windell Moulding, SLP PPS Coordinator present : Daiva Nakayama, RN, CRRN     Current Status/Progress Goal Weekly Team Focus  Medical   Decreased functional ability with lower extremity weakness secondary to GBS  Improve mobility, safety, cognition, HTN  See above   Bowel/Bladder   Continent of bowel and bladder; LBM 01/06/18  Remain continent  Assess bowel and bladder needs q shift and PRN   Swallow/Nutrition/ Hydration             ADL's   pt continues to need min guard for LB bathing and dressing and transfers due to frequent posterior LOB, decreased memory, awareness   supervision level overall, with min A for toilet transfers, shower stall transfers, dynamic standing balance, IADLs  ADL training with use of  RW to prevent posterior LOB, family education   Mobility   continued balance deficits with posterior preference. Supervision assist bed mobility. Supervision assist transfers and gait training with RW and min assist without AD. CGA-min assist stair management.    Supervision assist transfers and ambulation with RW. Min assist car transfer and stair management.   Improved safety awareness and improved awareness of deficits. Improved independence with gait, transfers, and access of home via stairs with LRAD and family education.    Communication             Safety/Cognition/ Behavioral Observations  min-mod assist   min assist   continue use of memory notebook, problem solving, attention to tasks   Pain   No c/o pain  Pain </= 2/10  Assess pain q shift and PRN and medicate as necessary   Skin   No skin issues  Remain free of infection and breakdown  Assess skin q shift and PRN and address any areas of concern    Rehab Goals Patient on target to meet rehab goals: Yes *See Care Plan and progress notes for long and short-term goals.     Barriers to Discharge  Current Status/Progress Possible Resolutions Date Resolved   Physician    Medical stability     See above  Therapies, optimize BP meds      Nursing                  PT  OT                  SLP                SW                Discharge Planning/Teaching Needs:  Home with spouse and other family members providing 24/ 7 supervision/ assistance.  Teaching is ongoing with spouse - here daily.   Team Discussion:  Medically stable.  Supervision overall and more independently using memory notebook.  Still having some LOB posteriorly and can require mod - max cues to stay on task.  Family ed completed and ready for d/c tomorrow.  Revisions to Treatment Plan:  NA    Continued Need for Acute Rehabilitation Level of Care: The patient requires daily medical management by a physician with specialized training in  physical medicine and rehabilitation for the following conditions: Daily direction of a multidisciplinary physical rehabilitation program to ensure safe treatment while eliciting the highest outcome that is of practical value to the patient.: Yes Daily medical management of patient stability for increased activity during participation in an intensive rehabilitation regime.: Yes Daily analysis of laboratory values and/or radiology reports with any subsequent need for medication adjustment of medical intervention for : Neurological problems;Blood pressure problems   I attest that I was present, lead the team conference, and concur with the assessment and plan of the team.   Tezra Mahr 01/08/2018, 9:08 AM

## 2018-01-08 NOTE — Progress Notes (Signed)
Discharged to home accompanied by spouse and family. Discharge instructions given by Alcide Goodness PAC and no questions noted. Belongings packed and pt taken down to car via w/c Margarito Liner

## 2018-01-08 NOTE — Discharge Instructions (Signed)
Inpatient Rehab Discharge Instructions  Sheryl James Discharge date and time: No discharge date for patient encounter.   Activities/Precautions/ Functional Status: Activity: activity as tolerated Diet: regular diet Wound Care: keep wound clean and dry Functional status:  ___ No restrictions     ___ Walk up steps independently ___ 24/7 supervision/assistance   ___ Walk up steps with assistance ___ Intermittent supervision/assistance  ___ Bathe/dress independently ___ Walk with walker     _x__ Bathe/dress with assistance ___ Walk Independently    ___ Shower independently ___ Walk with assistance    ___ Shower with assistance ___ No alcohol     ___ Return to work/school ________    COMMUNITY REFERRALS UPON DISCHARGE:    Home Health:   PT     OT     ST                     Agency:  [facility] Phone: [phone]   Medical Equipment/Items Ordered:  Rolling walker                                                       Agency/Supplier:  [facility] @ [phone]    GENERAL COMMUNITY RESOURCES FOR PATIENT/FAMILY:  Support Groups:  [place] [facility]                                2nd Tuesday of each month @ 7pm                     Location:  Inpatient Rehab Unit [address]                     Contact:   Lennart Pall, [place] @ [phone]     Special Instructions: No driving or smoking   My questions have been answered and I understand these instructions. I will adhere to these goals and the provided educational materials after my discharge from the hospital.  Patient/Caregiver Signature _______________________________ Date __________  Clinician Signature _______________________________________ Date __________  Please bring this form and your medication list with you to all your follow-up doctor's appointments.

## 2018-01-09 ENCOUNTER — Telehealth: Payer: Self-pay

## 2018-01-09 NOTE — Telephone Encounter (Signed)
Transitional Care call  Patient name: Sheryl James, [place].) DOB: (03-17-1955) 1. Are you/is patient experiencing any problems since coming home? (NO) a. Are there any questions regarding any aspect of care? (NA) 2. Are there any questions regarding medications administration/dosing? (NO) a. Are meds being taken as prescribed? (YES) b. "Patient should review meds with caller to confirm"  3. Have there been any falls? (NO) 4. Has Home Health been to the house and/or have they contacted you? (NO, PATIENT WAS DISCHARGED YESTERDAY, WAS ADVISED THAT IF THEY DO NOT HEAR FROM HOME HEALTH TO CALL us ON Monday MORNING AND KEEP [name] INFORMED.) a. If not, have you tried to contact them? (NO b. Can we help you contact them? (NO) 5. Are bowels and bladder emptying properly? (YES) a. Are there any unexpected incontinence issues? (Na) b. If applicable, is patient following bowel/bladder programs? (NA) 6. Any fevers, problems with breathing, unexpected pain? (NO) 7. Are there any skin problems or new areas of breakdown? (NO) 8. Has the patient/family member arranged specialty MD follow up (ie cardiology/neurology/renal/surgical/etc.)?  (YES) a. Can we help arrange? (NA) 9. Does the patient need any other services or support that we can help arrange? (NO) 10. Are caregivers following through as expected in assisting the patient? (YES) 11. Has the patient quit smoking, drinking alcohol, or using drugs as recommended? (NO)  Appointment date/time (01-21-2018 / 11:20am), arrive time (11:00am) and who it is with here (Dr. Posey Pronto) [place]

## 2018-01-11 DIAGNOSIS — I69019 Unspecified symptoms and signs involving cognitive functions following nontraumatic subarachnoid hemorrhage: Secondary | ICD-10-CM | POA: Diagnosis not present

## 2018-01-11 DIAGNOSIS — Z7409 Other reduced mobility: Secondary | ICD-10-CM | POA: Diagnosis not present

## 2018-01-11 DIAGNOSIS — I69098 Other sequelae following nontraumatic subarachnoid hemorrhage: Secondary | ICD-10-CM | POA: Diagnosis not present

## 2018-01-13 ENCOUNTER — Telehealth: Payer: Self-pay | Admitting: *Deleted

## 2018-01-13 NOTE — Telephone Encounter (Signed)
[facility] requests 2wk3 PT orders.  Approval given.

## 2018-01-16 ENCOUNTER — Emergency Department (HOSPITAL_COMMUNITY)
Admission: EM | Admit: 2018-01-16 | Discharge: 2018-01-16 | Disposition: A | Discharge status: Home / Self Care | Payer: 59 | Attending: Emergency Medicine | Admitting: Emergency Medicine

## 2018-01-16 ENCOUNTER — Encounter (HOSPITAL_COMMUNITY): Payer: Self-pay | Admitting: Emergency Medicine

## 2018-01-16 ENCOUNTER — Emergency Department (HOSPITAL_COMMUNITY): Payer: 59

## 2018-01-16 DIAGNOSIS — R1111 Vomiting without nausea: Secondary | ICD-10-CM | POA: Diagnosis not present

## 2018-01-16 DIAGNOSIS — R55 Syncope and collapse: Secondary | ICD-10-CM | POA: Diagnosis not present

## 2018-01-16 DIAGNOSIS — I10 Essential (primary) hypertension: Secondary | ICD-10-CM | POA: Insufficient documentation

## 2018-01-16 DIAGNOSIS — E876 Hypokalemia: Secondary | ICD-10-CM | POA: Diagnosis not present

## 2018-01-16 DIAGNOSIS — Z743 Need for continuous supervision: Secondary | ICD-10-CM | POA: Diagnosis not present

## 2018-01-16 DIAGNOSIS — Z79899 Other long term (current) drug therapy: Secondary | ICD-10-CM | POA: Diagnosis not present

## 2018-01-16 DIAGNOSIS — F17210 Nicotine dependence, cigarettes, uncomplicated: Secondary | ICD-10-CM | POA: Diagnosis not present

## 2018-01-16 DIAGNOSIS — G40909 Epilepsy, unspecified, not intractable, without status epilepticus: Secondary | ICD-10-CM | POA: Diagnosis present

## 2018-01-16 DIAGNOSIS — R569 Unspecified convulsions: Secondary | ICD-10-CM | POA: Diagnosis not present

## 2018-01-16 LAB — COMPREHENSIVE METABOLIC PANEL
ALK PHOS: 78 U/L (ref 38–126)
ALT: 12 U/L (ref 0–44)
AST: 19 U/L (ref 15–41)
Albumin: 3.4 g/dL — ABNORMAL LOW (ref 3.5–5.0)
Anion gap: 11 (ref 5–15)
BUN: 8 mg/dL (ref 8–23)
CALCIUM: 8.8 mg/dL — AB (ref 8.9–10.3)
CHLORIDE: 107 mmol/L (ref 98–111)
CO2: 23 mmol/L (ref 22–32)
CREATININE: 0.77 mg/dL (ref 0.44–1.00)
GFR calc Af Amer: >60 mL/min (ref >60)
GFR calc non Af Amer: >60 mL/min (ref >60)
GLUCOSE: 139 mg/dL — AB (ref 70–99)
Potassium: 3.1 mmol/L — ABNORMAL LOW (ref 3.5–5.1)
SODIUM: 141 mmol/L (ref 135–145)
Total Bilirubin: 0.6 mg/dL (ref 0.3–1.2)
Total Protein: 6.6 g/dL (ref 6.5–8.1)

## 2018-01-16 LAB — CBC WITH DIFFERENTIAL/PLATELET
Abs Immature Granulocytes: 0.03 10*3/uL (ref 0.00–0.07)
BASOS ABS: 0.1 10*3/uL (ref 0.0–0.1)
Basophils Relative: 1 %
EOS ABS: 0.2 10*3/uL (ref 0.0–0.5)
EOS PCT: 2 %
HCT: 42.6 % (ref 36.0–46.0)
Hemoglobin: 13.3 g/dL (ref 12.0–15.0)
Immature Granulocytes: 0 %
Lymphocytes Relative: 40 %
Lymphs Abs: 3.4 10*3/uL (ref 0.7–4.0)
MCH: 29.3 pg (ref 26.0–34.0)
MCHC: 31.2 g/dL (ref 30.0–36.0)
MCV: 93.8 fL (ref 80.0–100.0)
Monocytes Absolute: 0.8 10*3/uL (ref 0.1–1.0)
Monocytes Relative: 9 %
NRBC: 0 % (ref 0.0–0.2)
Neutro Abs: 4.1 10*3/uL (ref 1.7–7.7)
Neutrophils Relative %: 48 %
PLATELETS: 297 10*3/uL (ref 150–400)
RBC: 4.54 MIL/uL (ref 3.87–5.11)
RDW: 13.6 % (ref 11.5–15.5)
WBC: 8.5 10*3/uL (ref 4.0–10.5)

## 2018-01-16 LAB — I-STAT TROPONIN, ED: Troponin i, poc: 0.01 ng/mL (ref 0.00–0.08)

## 2018-01-16 MED ORDER — POTASSIUM CHLORIDE ER 10 MEQ PO TBCR: 10.0000 meq | EXTENDED_RELEASE_TABLET | Freq: Every day | ORAL | 0 refills | Status: DC

## 2018-01-16 NOTE — ED Provider Notes (Signed)
[place] EMERGENCY DEPARTMENT Provider Note   CSN: [id number] Arrival date & time: 01/16/18  1346     History   Chief Complaint Chief Complaint  Patient presents with  . Seizures    HPI Sheryl James is a 62 y.o. female presenting for evaluation of syncopal event.  Patient states she was at a restaurant when she started to feel off.  Per her family, she then slumped and became unresponsive, although patient states she vaguely remembers this.  She had one episode of vomiting, and immediately felt better and return to baseline.  Since then, patient has had no further alteration of consciousness.  Family denies seizure-like activity.  Patient denies headache, vision changes, slurred speech, fevers, chills, cough, chest pain, shortness of breath, current nausea, abdominal pain, urinary symptoms, abnormal bowel movements.  She denies leg pain or swelling.  Of note, patient had an SAH a month and a half ago that was treated with a drain.  She has some residual bilateral lower leg weakness, but no other deficits.  She has had no issues since the [facility].  She denies recent fall, trauma, or injury.  Patient states she feels baseline, family states she is acting baseline.  Additional history obtained from chart review, SAH was October 3.  Treated with a ventricular drain with Dr Kathyrn Sheriff.  Additional medical history includes hypertension which is now controlled with medication.  Patient denies other medical problems.  HPI  History reviewed. No pertinent past medical history.  Patient Active Problem List   Diagnosis Date Noted  . Cognitive deficit, post-stroke   . Hypertension   . Hypoalbuminemia due to protein-calorie malnutrition ([place])   . Urinary retention   . Seizure prophylaxis   . SAH (subarachnoid hemorrhage) ([place]) 12/26/2017  . Subarachnoid hemorrhage due to ruptured aneurysm ([place]) 11/26/2017    Past Surgical History:  Procedure Laterality Date  . IR ANGIO  INTRA EXTRACRAN SEL INTERNAL CAROTID BILAT MOD SED  11/27/2017  . IR ANGIO VERTEBRAL SEL VERTEBRAL UNI L MOD SED  11/27/2017  . IR ANGIOGRAM FOLLOW UP STUDY  11/27/2017  . IR ANGIOGRAM FOLLOW UP STUDY  11/27/2017  . IR ANGIOGRAM FOLLOW UP STUDY  11/27/2017  . IR ANGIOGRAM FOLLOW UP STUDY  11/27/2017  . IR TRANSCATH/EMBOLIZ  11/27/2017  . IR US GUIDE VASC ACCESS RIGHT  11/27/2017  . RADIOLOGY WITH ANESTHESIA N/A 11/27/2017   Procedure: IR WITH ANESTHESIA;  Surgeon: Consuella Lose, MD;  Location: [facility];  Service: Radiology;  Laterality: N/A;     OB History   None      Home Medications    Prior to Admission medications   Medication Sig Start Date End Date Taking? Authorizing Provider  acetaminophen (TYLENOL) 325 MG tablet Take 2 tablets (650 mg total) by mouth every 4 (four) hours as needed for mild pain (or temp > 37.5 C (99.5 F)). 01/08/18   Angiulli, Lavon Paganini, PA-C  bethanechol (URECHOLINE) 10 MG tablet Take 1 tablet (10 mg total) by mouth 3 (three) times daily. 01/08/18   Angiulli, Lavon Paganini, PA-C  lisinopril (PRINIVIL,ZESTRIL) 2.5 MG tablet Take 1 tablet (2.5 mg total) by mouth daily. 01/08/18   Angiulli, Lavon Paganini, PA-C  pantoprazole (PROTONIX) 40 MG tablet Take 1 tablet (40 mg total) by mouth daily. 01/08/18   Angiulli, Lavon Paganini, PA-C  potassium chloride (K-DUR) 10 MEQ tablet Take 1 tablet (10 mEq total) by mouth daily for 5 days. 01/16/18 01/21/18  Benay Pomeroy, PA-C  saccharomyces boulardii (  FLORASTOR) 250 MG capsule Take 1 capsule (250 mg total) by mouth 2 (two) times daily. 01/08/18   Angiulli, Lavon Paganini, PA-C  tamsulosin (FLOMAX) 0.4 MG CAPS capsule Take 2 capsules (0.8 mg total) by mouth daily. 01/08/18   Angiulli, Lavon Paganini, PA-C    Family History No family history on file.  Social History Social History   Tobacco Use  . Smoking status: Current Every Day Smoker    Packs/day: 0.50    Types: Cigarettes  . Smokeless tobacco: Never Used  Substance Use Topics  . Alcohol  use: Never    Frequency: Never  . Drug use: Never     Allergies   Patient has no known allergies.   Review of Systems Review of Systems  Gastrointestinal: Positive for vomiting (one event, resolved).  Neurological: Positive for syncope (one event, resolved).  All other systems reviewed and are negative.    Physical Exam Updated Vital Signs BP (!) 148/72   Pulse 71   Temp 98 F (36.7 C) (Oral)   Resp 17   SpO2 95%   Physical Exam  Constitutional: She is oriented to person, place, and time. She appears well-developed and well-nourished. No distress.  Resting comfortably in the bed in no acute distress  HENT:  Head: Normocephalic and atraumatic.  OP clear without tonsillar swelling or exudate.  Uvula midline with equal palate rise.  MM moist.  Eyes: Pupils are equal, round, and reactive to light. Conjunctivae and EOM are normal.  EOMI and PERRLA.  No nystagmus.  Neck: Normal range of motion. Neck supple.  Cardiovascular: Normal rate, regular rhythm and intact distal pulses.  Pulmonary/Chest: Effort normal and breath sounds normal. No respiratory distress. She has no wheezes.  Abdominal: Soft. She exhibits no distension and no mass. There is no tenderness. There is no rebound and no guarding.  Musculoskeletal: Normal range of motion.  Strength intact x4.  Sensation intact x4.  Radial pedal pulses intact bilaterally.  Soft compartments.  Neurological: She is alert and oriented to person, place, and time. She displays normal reflexes. No cranial nerve deficit or sensory deficit. Coordination normal.  Obvious neurologic deficits.  Nose to finger intact.  Grip strength intact.  DTRs intact.  Coordination intact.  CN intact.  Skin: Skin is warm and dry. Capillary refill takes less than 2 seconds.  Psychiatric: She has a normal mood and affect.  Nursing note and vitals reviewed.    ED Treatments / Results  Labs (all labs ordered are listed, but only abnormal results are  displayed) Labs Reviewed  COMPREHENSIVE METABOLIC PANEL - Abnormal; Notable for the following components:      Result Value   Potassium 3.1 (*)    Glucose, Bld 139 (*)    Calcium 8.8 (*)    Albumin 3.4 (*)    All other components within normal limits  CBC WITH DIFFERENTIAL/PLATELET  URINALYSIS, ROUTINE W REFLEX MICROSCOPIC  I-STAT TROPONIN, ED    EKG EKG Interpretation  Date/Time:  Friday January 16 2018 13:51:41 EST Ventricular Rate:  82 PR Interval:    QRS Duration: 100 QT Interval:  409 QTC Calculation: 478 R Axis:   56 Text Interpretation:  Sinus rhythm Confirmed by Nat Christen [phone]) on 01/16/2018 4:39:50 PM   Radiology Ct Head Wo Contrast  Result Date: 01/16/2018 CLINICAL DATA:  Altered level of consciousness, unexplained EXAM: CT HEAD WITHOUT CONTRAST TECHNIQUE: Contiguous axial images were obtained from the base of the skull through the vertex without intravenous contrast. COMPARISON:  12/21/2017 FINDINGS: Brain: Generalized dilatation of the ventricular system, most prominent of the lateral and third ventricles which are ballooned. Ventricular measurements are stable from prior. A ventriculostomy has been removed since prior. Cluster of small remote left cerebellar infarcts. Gliosis seen along the right frontal ventriculostomy site. There is low-density in the periventricular white matter, most notable in the bilateral frontal regions. Remote left thalamic infarct. Remote right caudate infarct. Vascular: No hyperdense vessel. Basilar terminus aneurysm coil mass. Skull: Unremarkable right frontal ventriculostomy site. Sinuses/Orbits: Bilateral cataract resection. IMPRESSION: 1. Hydrocephalus that is unchanged from 12/21/2017. 2. White matter low-density that was present prior to hydrocephalus and consistent with chronic small vessel ischemia. 3. Remote small vessel infarcts. Electronically Signed   By: Monte Fantasia M.D.   On: 01/16/2018 16:00    Procedures Procedures  (including critical care time)  Medications Ordered in ED Medications - No data to display   Initial Impression / Assessment and Plan / ED Course  I have reviewed the triage vital signs and the nursing notes.  Pertinent labs & imaging results that were available during my care of the patient were reviewed by me and considered in my medical decision making (see chart for details).     Pt presenting for evaluation of a short episode of altered mental status followed by one episode of vomiting before returning to baseline.  Upon arrival to the ER, physical exam is reassuring, no obvious neurologic deficits.  No signs of infection.  No abdominal pain or further nausea/vomiting.  Consider possible vasovagal event/syncopal event.  No seizure-like activity noted by family, possible seizure, although lower suspicion at this time.  Will obtain CT head due to recent aneurysm causing bleed, basic labs, EKG, and reassess.  CT head without acute findings.  No cranial bleeding.  Labs reassuring, no leukocytosis.  Potassium mildly low at 3.1, will replace orally at home.  Otherwise reassuring.  Creatinine stable.  Troponin negative.  EKG without STEMI.  Discussed with attending, Dr. Lacinda Axon evaluated the patient.  Discussed findings with patient and family.  Discussed that unknown cause for her syncopal event at this time, although consider vasovagal.  Discussed importance of hydration.  Discussed follow-up with PCP for further evaluation and management.  At this time, patient appears safe for discharge.  Return precautions given.  Patient and family state they understand and agree to plan.   Final Clinical Impressions(s) / ED Diagnoses   Final diagnoses:  Syncope, unspecified syncope type  Hypokalemia    ED Discharge Orders         Ordered    potassium chloride (K-DUR) 10 MEQ tablet  Daily     01/16/18 1732           Franchot Heidelberg, PA-C 01/16/18 1804    Nat Christen, MD 01/17/18 0025

## 2018-01-16 NOTE — ED Triage Notes (Signed)
Per EMS- pt was eating at [employer] when she had a possible seizure. Pt states she kind of remembers it but remembers feeling in and out. Family reports possibly some shaking. Pt was vomiting during the episode. Pt was here 1 week ago and had an aneurysm repaired.

## 2018-01-16 NOTE — Discharge Instructions (Addendum)
It is important that you follow-up with your primary care doctor for further evaluation of your symptoms. Take potassium pills as prescribed.  Make sure you are staying well hydrated with water. Return to the emergency room with any new, worsening, concerning symptoms.

## 2018-01-19 ENCOUNTER — Telehealth: Payer: Self-pay | Admitting: *Deleted

## 2018-01-19 NOTE — Telephone Encounter (Signed)
Patient should not continue with therapies if SBP>160 (given her recent ED visit for syncope).  She should also see Cardiology, if she has not already.   Regarding her potassium, she should follow with discharge instructions.  I was not the prescribing provider, however, if she had a rash, she can stop the medication, but she does need repeat labs from her PCP.  Thank you.

## 2018-01-19 NOTE — Telephone Encounter (Signed)
PT called to let us know Sheryl James's bp was 170/80 and to let family know parameters.  I have spoken with Sheryl James and he took BP @2 :50 pm and it was 122/66.  He also said it was 130/80's at 4 am and he thinks it goes up wihen the therapist is in the home (and she had only taken her bp meds 30 min before the therapist arrival)..    Sheryl James had called to report a problem with her medication.  She has broken out in rash and itching after taking the potassium.  He has stopped it as she was only to take for 5 days.  I discussed food selections like bananas and orange juice and green leafy vegetables which provide potassium. She is not on a diuretic and her K+was 3.1 at discharge.  Please advise if other instructions needed.

## 2018-01-19 NOTE — Telephone Encounter (Signed)
Information given to Mr Mun and the names of some PCP practices since she does not have a PCP. She has never been seen by a cardiologist that he is aware of.

## 2018-01-21 ENCOUNTER — Encounter: Payer: 59 | Attending: Physical Medicine & Rehabilitation | Admitting: Physical Medicine & Rehabilitation

## 2018-01-21 ENCOUNTER — Other Ambulatory Visit: Payer: Self-pay

## 2018-01-21 ENCOUNTER — Encounter: Payer: Self-pay | Admitting: Physical Medicine & Rehabilitation

## 2018-01-21 VITALS — BP 152/88 | HR 86 | Ht 60.5 in | Wt 143.0 lb

## 2018-01-21 DIAGNOSIS — R4189 Other symptoms and signs involving cognitive functions and awareness: Secondary | ICD-10-CM | POA: Diagnosis present

## 2018-01-21 DIAGNOSIS — I608 Other nontraumatic subarachnoid hemorrhage: Secondary | ICD-10-CM | POA: Diagnosis not present

## 2018-01-21 DIAGNOSIS — R569 Unspecified convulsions: Secondary | ICD-10-CM

## 2018-01-21 DIAGNOSIS — F17210 Nicotine dependence, cigarettes, uncomplicated: Secondary | ICD-10-CM

## 2018-01-21 DIAGNOSIS — I69098 Other sequelae following nontraumatic subarachnoid hemorrhage: Secondary | ICD-10-CM | POA: Diagnosis not present

## 2018-01-21 DIAGNOSIS — R339 Retention of urine, unspecified: Secondary | ICD-10-CM | POA: Diagnosis not present

## 2018-01-21 DIAGNOSIS — R269 Unspecified abnormalities of gait and mobility: Secondary | ICD-10-CM | POA: Diagnosis not present

## 2018-01-21 DIAGNOSIS — I10 Essential (primary) hypertension: Secondary | ICD-10-CM | POA: Insufficient documentation

## 2018-01-21 DIAGNOSIS — R55 Syncope and collapse: Secondary | ICD-10-CM

## 2018-01-21 DIAGNOSIS — Z7409 Other reduced mobility: Secondary | ICD-10-CM

## 2018-01-21 DIAGNOSIS — I69019 Unspecified symptoms and signs involving cognitive functions following nontraumatic subarachnoid hemorrhage: Secondary | ICD-10-CM | POA: Diagnosis not present

## 2018-01-21 NOTE — Progress Notes (Signed)
Subjective:    Patient ID: Sheryl James, female    DOB: 1955-03-01, 62 y.o.   MRN: [id number]  HPI 62 year old right-handed female with history of tobacco abuse presents for transitional care management after receiving CIR for bilateral SAH status post aneurysm coiling.  DATE OF ADMISSION: 12/26/2017 DATE OF DISCHARGE: 01/08/2018  Husband provides majority of history. Since discharge, patient went to the ED for suspected seizures and was noted to have a syncopal episode with hypokalemia, notes reviewed. Husband states it was not syncopal and she had seizure. She sees Neurosurg next month. She has not established with PCP. She is urinating without difficulty. Diarrhea has resolved. She is not smoking.  BP is elevated, records from home reviewed, elevated, but improving.   Therapies: 2/week. DME: Previously owned Mobility: Environmental consultant at all times   Pain Inventory Average Pain 0 Pain Right Now 0 My pain is no pain  In the last 24 hours, has pain interfered with the following? General activity 0 Relation with others 0 Enjoyment of life 0 What TIME of day is your pain at its worst? no pain Sleep (in general) Good  Pain is worse with: no pain Pain improves with: no pain Relief from Meds: no meds  Mobility walk with assistance ability to climb steps?  yes Do you have any goals in this area?  yes  Function not employed: date last employed Oct 2018 I need assistance with the following:  dressing, bathing, toileting, meal prep, household duties and shopping  Neuro/Psych weakness trouble walking confusion  Prior Studies Any changes since last visit?  no  Physicians involved in your care Any changes since last visit?  yes Neurosurgeon Dr. Ashok Pall   No family history on file. Social History   Socioeconomic History  . Marital status: Married    Spouse name: Not on file  . Number of children: Not on file  . Years of education: Not on file  . Highest education  level: Not on file  Occupational History  . Not on file  Social Needs  . Financial resource strain: Not on file  . Food insecurity:    Worry: Not on file    Inability: Not on file  . Transportation needs:    Medical: Not on file    Non-medical: Not on file  Tobacco Use  . Smoking status: Current Every Day Smoker    Packs/day: 0.50    Types: Cigarettes  . Smokeless tobacco: Never Used  Substance and Sexual Activity  . Alcohol use: Never    Frequency: Never  . Drug use: Never  . Sexual activity: Not on file  Lifestyle  . Physical activity:    Days per week: Not on file    Minutes per session: Not on file  . Stress: Not on file  Relationships  . Social connections:    Talks on phone: Not on file    Gets together: Not on file    Attends religious service: Not on file    Active member of club or organization: Not on file    Attends meetings of clubs or organizations: Not on file    Relationship status: Not on file  Other Topics Concern  . Not on file  Social History Narrative  . Not on file   Past Surgical History:  Procedure Laterality Date  . IR ANGIO INTRA EXTRACRAN SEL INTERNAL CAROTID BILAT MOD SED  11/27/2017  . IR ANGIO VERTEBRAL SEL VERTEBRAL UNI L MOD SED  11/27/2017  .  IR ANGIOGRAM FOLLOW UP STUDY  11/27/2017  . IR ANGIOGRAM FOLLOW UP STUDY  11/27/2017  . IR ANGIOGRAM FOLLOW UP STUDY  11/27/2017  . IR ANGIOGRAM FOLLOW UP STUDY  11/27/2017  . IR TRANSCATH/EMBOLIZ  11/27/2017  . IR US GUIDE VASC ACCESS RIGHT  11/27/2017  . RADIOLOGY WITH ANESTHESIA N/A 11/27/2017   Procedure: IR WITH ANESTHESIA;  Surgeon: Consuella Lose, MD;  Location: [facility];  Service: Radiology;  Laterality: N/A;   No past medical history on file. BP (!) 152/88   Pulse 86   Ht 5' 0.5" (1.537 m)   Wt 143 lb (64.9 kg)   SpO2 95%   BMI 27.47 kg/m   Opioid Risk Score:   Fall Risk Score:  `1  Depression screen PHQ 2/9  Depression screen PHQ 2/9 01/21/2018  Decreased Interest 0  Down,  Depressed, Hopeless 0  PHQ - 2 Score 0    Review of Systems  Constitutional: Positive for unexpected weight change.  HENT: Negative.   Eyes: Negative.   Respiratory: Negative.   Cardiovascular: Negative.   Gastrointestinal: Negative.   Endocrine: Negative.   Genitourinary: Negative.   Musculoskeletal: Positive for gait problem.  Skin: Negative.   Allergic/Immunologic: Negative.   Neurological: Positive for dizziness. Negative for headaches.  Hematological: Negative.   Psychiatric/Behavioral: Positive for confusion (Improved).  All other systems reviewed and are negative.     Objective:   Physical Exam Constitutional: No distress . Vital signs reviewed. HENT: Normocephalic.  Atraumatic. Eyes: EOMI. No discharge. Cardiovascular: RRR. No JVD. Respiratory: CTA bilaterally. Normal effort. GI: BS +. Non-distended. Musc: No edema or tenderness in extremities. Neurological: She is alert and oriented x3 Processing delays, improving.  Motor:  RUE/RLE: 4+/5 proximal distal, stable  LUE/LLE: 4+/5 proximal to distal, stable Skin: She is not diaphoretic.  Warm and dry. Psychiatric: Flat, improving    Assessment & Plan:  62 year old right-handed female with history of tobacco abuse presents for transitional care management after receiving CIR for bilateral SAH status post aneurysm coiling.  1. Decreased functional mobility with cognitive deficits secondary to bilateral medial frontal/temporal subarachnoid hemorrhage from aneurysmal bleed. Pt is status post basilar tip aneurysm coiling  Cont therapies  Follow up with Neurology  2.  ?Seizures  Husband believes seizure episode, diagnosed as syncope in ED  Will refer to Neurology  3.  Urinary retention.   Improving  Stopped Urecholine              Will decrease Flomax to 0.4 mg daily for 1 week, then d/c  3.  Tobacco abuse.    Cont to abstain  4.  Hypertension             Labile, but ?improving  Cont Lisinopril 2.5   Needs  follow up appointment with PCP  5. Gait abnormality  Cont therapies  Cont walker for safety  Meds reviewed Referrals reviewed All questions answered

## 2018-01-29 ENCOUNTER — Telehealth: Payer: Self-pay | Admitting: *Deleted

## 2018-01-29 ENCOUNTER — Ambulatory Visit: Payer: 59 | Admitting: Neurology

## 2018-01-29 NOTE — Telephone Encounter (Signed)
Iris Pert, PT, [facility] left a message for verbal orders to extend home health PT 1week1 followed by 2week2 followed by 1week2.   Medical record reviewed. Social work note reviewed.  Verbal orders given per office protocol.

## 2018-02-02 DIAGNOSIS — I609 Nontraumatic subarachnoid hemorrhage, unspecified: Secondary | ICD-10-CM | POA: Diagnosis not present

## 2018-02-02 DIAGNOSIS — Z6825 Body mass index (BMI) 25.0-25.9, adult: Secondary | ICD-10-CM | POA: Diagnosis not present

## 2018-02-02 DIAGNOSIS — R030 Elevated blood-pressure reading, without diagnosis of hypertension: Secondary | ICD-10-CM | POA: Diagnosis not present

## 2018-02-26 ENCOUNTER — Telehealth: Payer: Self-pay

## 2018-02-26 DIAGNOSIS — I609 Nontraumatic subarachnoid hemorrhage, unspecified: Secondary | ICD-10-CM

## 2018-02-26 NOTE — Telephone Encounter (Signed)
Iris Pert PT [facility] called in reguards to patient Sheryl James, stated patient is to be discharged from [facility] services and is recommending Out-Patient therapies at the [address] [facility].  If any questions please call [phone]

## 2018-02-26 NOTE — Telephone Encounter (Signed)
Thanks.  We can make a referral if they need one.

## 2018-02-27 NOTE — Telephone Encounter (Signed)
Order placed

## 2018-02-27 NOTE — Addendum Note (Signed)
Addended by: Geryl Rankins D on: 02/27/2018 11:39 AM   Modules accepted: Orders

## 2018-03-04 ENCOUNTER — Encounter: Payer: 59 | Admitting: Physical Medicine & Rehabilitation

## 2018-03-11 DIAGNOSIS — I671 Cerebral aneurysm, nonruptured: Secondary | ICD-10-CM | POA: Diagnosis not present

## 2018-03-11 DIAGNOSIS — I10 Essential (primary) hypertension: Secondary | ICD-10-CM | POA: Diagnosis not present

## 2018-03-11 DIAGNOSIS — R2689 Other abnormalities of gait and mobility: Secondary | ICD-10-CM | POA: Diagnosis not present

## 2018-03-12 ENCOUNTER — Encounter: Payer: 59 | Attending: Physical Medicine & Rehabilitation | Admitting: Physical Medicine & Rehabilitation

## 2018-03-12 ENCOUNTER — Other Ambulatory Visit: Payer: Self-pay

## 2018-03-12 ENCOUNTER — Encounter: Payer: Self-pay | Admitting: Physical Medicine & Rehabilitation

## 2018-03-12 VITALS — BP 138/83 | HR 68 | Ht 60.0 in | Wt 144.8 lb

## 2018-03-12 DIAGNOSIS — R339 Retention of urine, unspecified: Secondary | ICD-10-CM | POA: Diagnosis not present

## 2018-03-12 DIAGNOSIS — F17210 Nicotine dependence, cigarettes, uncomplicated: Secondary | ICD-10-CM | POA: Diagnosis not present

## 2018-03-12 DIAGNOSIS — R4189 Other symptoms and signs involving cognitive functions and awareness: Secondary | ICD-10-CM | POA: Insufficient documentation

## 2018-03-12 DIAGNOSIS — I608 Other nontraumatic subarachnoid hemorrhage: Secondary | ICD-10-CM

## 2018-03-12 DIAGNOSIS — I10 Essential (primary) hypertension: Secondary | ICD-10-CM | POA: Insufficient documentation

## 2018-03-12 DIAGNOSIS — R269 Unspecified abnormalities of gait and mobility: Secondary | ICD-10-CM

## 2018-03-12 DIAGNOSIS — R55 Syncope and collapse: Secondary | ICD-10-CM

## 2018-03-12 DIAGNOSIS — I609 Nontraumatic subarachnoid hemorrhage, unspecified: Secondary | ICD-10-CM | POA: Diagnosis not present

## 2018-03-12 NOTE — Progress Notes (Signed)
Subjective:    Patient ID: Sheryl James, female    DOB: 09-Oct-1955, 63 y.o.   MRN: [id number]  HPI 63 year old right-handed female with history of tobacco abuse presents for follow up for bilateral SAH status post aneurysm coiling.  Last clinic 01/21/18.  Husband supplements history. Since last visit, pt states she completed HH therapies and is now going to go to outpatient therapies. She has not followed up with Neurology, but is following up with Neurosurg. She has not scheduled for follow up for seizures. Urinary retention has resolved.  She is not smoking. BP relatively controlled, BP meds increased by PCP. Denies falls, using walker 50% at home. Patient with questions regarding mention of abnormal parotid gland by PCP, husband believes on last CT.  Last few head CT reports reviewed, no mention.  Pain Inventory Average Pain 0 Pain Right Now 0 My pain is no pain  In the last 24 hours, has pain interfered with the following? General activity 0 Relation with others 0 Enjoyment of life 0 What TIME of day is your pain at its worst? no pain Sleep (in general) Good  Pain is worse with: no pain Pain improves with: no pain Relief from Meds: no meds  Mobility walk with assistance how many minutes can you walk? 10 ability to climb steps?  yes do you drive?  no Do you have any goals in this area?  yes  Function not employed: date last employed Oct 2018 I need assistance with the following:  dressing, bathing, toileting, meal prep, household duties and shopping  Neuro/Psych No problems in this area  Prior Studies Any changes since last visit?  no  Physicians involved in your care Any changes since last visit?  yes Neurosurgeon Dr. Ashok Pall   No family history on file. Social History   Socioeconomic History  . Marital status: Married    Spouse name: Not on file  . Number of children: Not on file  . Years of education: Not on file  . Highest education level: Not on  file  Occupational History  . Not on file  Social Needs  . Financial resource strain: Not on file  . Food insecurity:    Worry: Not on file    Inability: Not on file  . Transportation needs:    Medical: Not on file    Non-medical: Not on file  Tobacco Use  . Smoking status: Current Every Day Smoker    Packs/day: 0.50    Types: Cigarettes  . Smokeless tobacco: Never Used  Substance and Sexual Activity  . Alcohol use: Never    Frequency: Never  . Drug use: Never  . Sexual activity: Not on file  Lifestyle  . Physical activity:    Days per week: Not on file    Minutes per session: Not on file  . Stress: Not on file  Relationships  . Social connections:    Talks on phone: Not on file    Gets together: Not on file    Attends religious service: Not on file    Active member of club or organization: Not on file    Attends meetings of clubs or organizations: Not on file    Relationship status: Not on file  Other Topics Concern  . Not on file  Social History Narrative  . Not on file   Past Surgical History:  Procedure Laterality Date  . IR ANGIO INTRA EXTRACRAN SEL INTERNAL CAROTID BILAT MOD SED  11/27/2017  .  IR ANGIO VERTEBRAL SEL VERTEBRAL UNI L MOD SED  11/27/2017  . IR ANGIOGRAM FOLLOW UP STUDY  11/27/2017  . IR ANGIOGRAM FOLLOW UP STUDY  11/27/2017  . IR ANGIOGRAM FOLLOW UP STUDY  11/27/2017  . IR ANGIOGRAM FOLLOW UP STUDY  11/27/2017  . IR TRANSCATH/EMBOLIZ  11/27/2017  . IR US GUIDE VASC ACCESS RIGHT  11/27/2017  . RADIOLOGY WITH ANESTHESIA N/A 11/27/2017   Procedure: IR WITH ANESTHESIA;  Surgeon: Consuella Lose, MD;  Location: [facility];  Service: Radiology;  Laterality: N/A;   No past medical history on file. BP 138/83   Pulse 68   Ht 5' (1.524 m)   Wt 144 lb 12.8 oz (65.7 kg)   SpO2 96%   BMI 28.28 kg/m   Opioid Risk Score:   Fall Risk Score:  `1  Depression screen PHQ 2/9  Depression screen [facility] 2/9 03/12/2018 01/21/2018  Decreased Interest 0 0  Down,  Depressed, Hopeless 0 0  PHQ - 2 Score 0 0    Review of Systems  Constitutional: Positive for unexpected weight change.  HENT: Negative.   Eyes: Negative.   Respiratory: Negative.   Cardiovascular: Negative.   Gastrointestinal: Negative.   Endocrine: Negative.   Genitourinary: Negative.   Musculoskeletal: Positive for gait problem.  Skin: Negative.   Allergic/Immunologic: Negative.   Neurological: Positive for dizziness. Negative for headaches.  Hematological: Negative.   Psychiatric/Behavioral: Positive for confusion (Improved).  All other systems reviewed and are negative.     Objective:   Physical Exam Constitutional: No distress . Vital signs reviewed. HENT: Normocephalic.  Atraumatic. Eyes: EOMI. No discharge. Cardiovascular: RRR. No JVD. Respiratory: CTA bilaterally. Normal effort. GI: BS +. Non-distended. Musc: No edema or tenderness in extremities. Neurological: She is alert and oriented x3. Processing delays, improved.  Motor:  RUE/RLE: 4+/5 proximal distal, unchanged LUE/LLE: 4+/5 proximal to distal, unchanged Skin: She is not diaphoretic.  Warm and dry. Psychiatric: Flat, improving    Assessment & Plan:  63 year old right-handed female with history of tobacco abuse presents for follow up for bilateral SAH status post aneurysm coiling.  1. Decreased functional mobility with cognitive deficits secondary to bilateral medial frontal/temporal subarachnoid hemorrhage from aneurysmal bleed. Pt is status post basilar tip aneurysm coiling  Cont therapies, now going to go to outpatient  Cont follow up with Neurosurg  2.  ?Seizures  Needs to schedule follow up with Neurology  3.  Urinary retention: Resolved   4.  Tobacco abuse.    Cont to abstain  5.  Hypertension             Relatively controlled today  Cont meds per PCP  6. Gait abnormality  Cont outpatient therapies  Cont walker for safety prn

## 2018-03-25 ENCOUNTER — Other Ambulatory Visit: Payer: Self-pay

## 2018-03-25 ENCOUNTER — Ambulatory Visit: Payer: 59 | Attending: Physical Medicine & Rehabilitation | Admitting: Physical Therapy

## 2018-03-25 ENCOUNTER — Encounter: Payer: Self-pay | Admitting: Physical Therapy

## 2018-03-25 DIAGNOSIS — M6281 Muscle weakness (generalized): Secondary | ICD-10-CM | POA: Diagnosis present

## 2018-03-25 DIAGNOSIS — R2689 Other abnormalities of gait and mobility: Secondary | ICD-10-CM | POA: Insufficient documentation

## 2018-03-25 DIAGNOSIS — R42 Dizziness and giddiness: Secondary | ICD-10-CM | POA: Diagnosis present

## 2018-03-25 DIAGNOSIS — I609 Nontraumatic subarachnoid hemorrhage, unspecified: Secondary | ICD-10-CM | POA: Insufficient documentation

## 2018-03-25 DIAGNOSIS — R2681 Unsteadiness on feet: Secondary | ICD-10-CM | POA: Diagnosis not present

## 2018-03-25 NOTE — Therapy (Signed)
[facility] [address] [place], [place], [address] Phone: [phone]   Fax:  [phone]  Physical Therapy Evaluation  Patient Details  Name: Sheryl James MRN: [id number] Date of Birth: 1955/08/28 Referring Provider (PT): Jamse Arn, MD   Encounter Date: 03/25/2018  PT End of Session - 03/25/18 0752    Visit Number  1    Number of Visits  17    Date for PT Re-Evaluation  05/24/18    Authorization Type  [employer]; $10 copay; VL=60 combined pt, ot, slp (zero used at time of PT eval)    Authorization - Visit Number  1    Authorization - Number of Visits  60    PT Start Time  6962    PT Stop Time  0840    PT Time Calculation (min)  45 min    Activity Tolerance  Patient tolerated treatment well    Behavior During Therapy  [facility] for tasks assessed/performed       History reviewed. No pertinent past medical history.  Past Surgical History:  Procedure Laterality Date  . IR ANGIO INTRA EXTRACRAN SEL INTERNAL CAROTID BILAT MOD SED  11/27/2017  . IR ANGIO VERTEBRAL SEL VERTEBRAL UNI L MOD SED  11/27/2017  . IR ANGIOGRAM FOLLOW UP STUDY  11/27/2017  . IR ANGIOGRAM FOLLOW UP STUDY  11/27/2017  . IR ANGIOGRAM FOLLOW UP STUDY  11/27/2017  . IR ANGIOGRAM FOLLOW UP STUDY  11/27/2017  . IR TRANSCATH/EMBOLIZ  11/27/2017  . IR US GUIDE VASC ACCESS RIGHT  11/27/2017  . RADIOLOGY WITH ANESTHESIA N/A 11/27/2017   Procedure: IR WITH ANESTHESIA;  Surgeon: Consuella Lose, MD;  Location: [facility];  Service: Radiology;  Laterality: N/A;    There were no vitals filed for this visit.   Subjective Assessment - 03/25/18 0801    Subjective  "Sheryl James" Still need to work on my balance. Has improved alot since she went home. She has begun to walk without her walker inside her home.     Patient is accompained by:  Family member   husband, Phil   Pertinent History  none    Patient Stated Goals  be able to walk safely without a device    Currently  in Pain?  No/denies         [facility] PT Assessment - 03/25/18 0803      Assessment   Medical Diagnosis  SAH    Referring Provider (PT)  Jamse Arn, MD    Onset Date/Surgical Date  11/26/17    Hand Dominance  Right    Prior Therapy  CIR, HH (finished a couple of weeks ago)      Precautions   Precautions  Fall      Restrictions   Weight Bearing Restrictions  No      Balance Screen   Has the patient fallen in the past 6 months  Yes    How many times?  2   1st week home; tried to wear boots with thick sole & toe cau   Has the patient had a decrease in activity level because of a fear of falling?   No    Is the patient reluctant to leave their home because of a fear of falling?   No      Home Environment   Living Environment  Private residence    Living Arrangements  Spouse/significant other    Available Help at Discharge  Family;Personal care attendant  caregiver 9:30-5; husband at work 6:00 a-7:30 p   Type of [place] to enter    Entrance Stairs-Number of Steps  4    Entrance Stairs-Rails  Right;Left;Cannot reach both    Home Layout  One level    [place] - 2 wheels;Shower seat;Grab bars - tub/shower;Bedside commode   BSC over toilet to elevate   Additional Comments  was using RW up until past week; now using 50% of the time      Prior Function   Level of Independence  Independent    Vocation  Retired    Leisure  sew, Training and development officer, Optician, dispensing   Overall Cognitive Status  Impaired/Different from baseline   husband reports decr safety awareness; does pay bills   Awareness  Impaired    Awareness Impairment  Anticipatory impairment   feels it is safe to walk without AD when alone     Observation/Other Assessments   Focus on Therapeutic Outcomes (FOTO)   NA      Sensation   Light Touch  Appears Intact      Coordination   Gross Motor Movements are Fluid and Coordinated  No    Fine Motor Movements are Fluid and  Coordinated  No   rapid toe taps asynchronous     Posture/Postural Control   Posture/Postural Control  Postural limitations    Postural Limitations  Rounded Shoulders;Forward head      Tone   Assessment Location  Right Lower Extremity;Left Lower Extremity      ROM / Strength   AROM / PROM / Strength  AROM;Strength      AROM   Overall AROM   Deficits    Overall AROM Comments  lt ankle DF to neutral (knee extended)      Strength   Overall Strength  Deficits    Overall Strength Comments  LLE Knee extension 3+, flexion 3, ankle DF 4; bil hip abdct 3+      Bed Mobility   Bed Mobility  Rolling Right;Rolling Left;Supine to Sit;Sit to Supine    Rolling Right  Independent    Rolling Left  Independent    Supine to Sit  Independent    Sit to Supine  Independent      Transfers   Transfers  Sit to Stand    Sit to Stand  7: Independent;Without upper extremity assist    Five time sit to stand comments   18.4 sec   no UEs     Ambulation/Gait   Ambulation/Gait  Yes    Ambulation/Gait Assistance  4: Min guard    Ambulation Distance (Feet)  60 Feet   75, 80, 40   Assistive device  None    Gait Pattern  Step-through pattern;Decreased step length - right;Decreased stance time - left    Ambulation Surface  Level;Indoor    Gait velocity  32.8/14.38=2.28 ft/sec (>2.68ft/sec for safe community ambulation)    Stairs  Yes    Stairs Assistance  6: Modified independent (Device/Increase time);4: Min guard    Stair Management Technique  One rail Right;Alternating pattern;Step to pattern;Forwards   ascend alternating; descend step-to; able to altern w/ mingu   Number of Stairs  8    Height of Stairs  6      Functional Gait  Assessment   Gait assessed   Yes    Gait Level Surface  Walks 20 ft in less than  7 sec but greater than 5.5 sec, uses assistive device, slower speed, mild gait deviations, or deviates 6-10 in outside of the 12 in walkway width.   8.9 sec   Change in Gait Speed  Able to  change speed, demonstrates mild gait deviations, deviates 6-10 in outside of the 12 in walkway width, or no gait deviations, unable to achieve a major change in velocity, or uses a change in velocity, or uses an assistive device.    Gait with Horizontal Head Turns  Performs head turns with moderate changes in gait velocity, slows down, deviates 10-15 in outside 12 in walkway width but recovers, can continue to walk.    Gait with Vertical Head Turns  Performs head turns with no change in gait. Deviates no more than 6 in outside 12 in walkway width.    Gait and Pivot Turn  Pivot turns safely in greater than 3 sec and stops with no loss of balance, or pivot turns safely within 3 sec and stops with mild imbalance, requires small steps to catch balance.    Step Over Obstacle  Is able to step over 2 stacked shoe boxes taped together (9 in total height) without changing gait speed. No evidence of imbalance.    Gait with Narrow Base of Support  Ambulates 4-7 steps.   5 steps   Gait with Eyes Closed  Cannot walk 20 ft without assistance, severe gait deviations or imbalance, deviates greater than 15 in outside 12 in walkway width or will not attempt task.    Ambulating Backwards  Walks 20 ft, uses assistive device, slower speed, mild gait deviations, deviates 6-10 in outside 12 in walkway width.    Steps  Alternating feet, must use rail.    Total Score  18    FGA comment:  <23 indicates incr fall risk      RLE Tone   RLE Tone  Within Functional Limits      LLE Tone   LLE Tone  Within Functional Limits           Vestibular Assessment - 03/25/18 0001      Vestibular Assessment   General Observation  pt reported "dizziness" occurs with rolling in bed and when goes to sit upright at side of bed      Symptom Behavior   Type of Dizziness  "Funny feeling in head"    Frequency of Dizziness  daily    Duration of Dizziness  seconds    Aggravating Factors  Supine to sit;Rolling to right;Rolling to left     Relieving Factors  Rest      Occulomotor Exam   Comment  time did not allow full vestibular evaluation; noted downbeating with ??rotational component when pt moved supine > sit; pt denies spinning sensation; during all other activities she denied dizziness          Objective measurements completed on examination: See above findings.              PT Education - 03/25/18 1427    Education Details  results of evaluation (compared to norms for age where appropriate); pt remains at increased risk of falls and strongly recommend she always use her walker if up by herself (and especially when home alone); PT POC and pt's goals    Person(s) Educated  Patient;Spouse    Methods  Explanation    Comprehension  Verbalized understanding       PT Short Term Goals - 03/25/18 1454  PT SHORT TERM GOAL #1   Title  Patient will be independent with strengthening and stretching exercises and supervision for balance exercises for HEP (Target for all STGs 04/24/2018)    Time  4    Period  Weeks    Status  New    Target Date  04/24/18      PT SHORT TERM GOAL #2   Title  Patient will ambulate 200 feet in simulated home environment (narrow spaces, doorways, etc) without a device with no LOB requiring physical assist to recover.     Time  4    Period  Weeks    Status  New      PT SHORT TERM GOAL #3   Title  Patient will improve gait velocity to >2.62 ft/sec for safe community ambulation    Baseline  03/25/18  2.28 ft/sec    Time  4    Period  Weeks    Status  New      PT SHORT TERM GOAL #4   Title  Patient will score >=23/30 on FGA to demonstrate lesser fall risk    Baseline  03/25/18 18/30    Time  4    Period  Weeks    Status  New        PT Long Term Goals - 03/25/18 1458      PT LONG TERM GOAL #1   Title  Patient will be independent with HEP (including safe with balance exercises) in preparation for discharge (Target for all LTGs 05/24/2018)    Time  8    Period  Weeks     Status  New    Target Date  05/24/18      PT LONG TERM GOAL #2   Title  Patient will ambulate 1000 feet in combination of simulated home environment (narrow spaces, doorways, etc) and outdoor unlevel, paved surfaces without a device independently    Time  8    Period  Weeks    Status  New      PT LONG TERM GOAL #3   Title  Patient will improve gait velocity to >=2.85 ft/sec (norm for age)    Time  8    Period  Weeks    Status  New      PT LONG TERM GOAL #4   Title  Patient will score >=25/30 on FGA to demonstrate lesser fall risk    Time  8    Period  Weeks    Status  New      PT LONG TERM GOAL #5   Title  Patient will decrease 5x STS to <12 seconds     Baseline  03/25/18 18.4 sec    Time  8    Period  Weeks    Status  New             Plan - 03/25/18 1439    Clinical Impression Statement  Patient referred for OPPT s/p bil SAH due to aneurysm bleed on 11/26/17. She underwent inpatient rehab and HHPT prior to referral to OPPT. She continues to have decreased balance and decreased safety with gait as evidenced by her outcome measures (5x STS 18.4 sec (>12 sec incr fall risk); gait velocity 2.28 ft/sec (>2.4ft/sec for safe community ambulation); FGA 18/30 (<23 indicates incr fall risk). Patient can benefit from the PT interventions listed below to address the deficits listed below.     History and Personal Factors relevant to plan of care:  PMH-none significant  prior to aneurysm bleed with bil SAH; Personal factors-very supportive husband+caregiver provide nearly 24/7 care (she does have ~5-6 hours per day when she is alone due to husband works 12 hour shifts)    Clinical Presentation  Stable    Clinical Presentation due to:  excellent progress since discharge home    Clinical Decision Making  Low    Rehab Potential  Good    PT Frequency  2x / week    PT Duration  8 weeks    PT Treatment/Interventions  ADLs/Self Care Home Management;[place] Repostioning;DME  Instruction;Balance training;Therapeutic exercise;Therapeutic activities;Functional mobility training;Stair training;Gait training;Neuromuscular re-education;Cognitive remediation;Patient/family education;Passive range of motion;Vestibular;Visual/perceptual remediation/compensation    PT Next Visit Plan  initiate HEP: bil gastroc and hamstring stretches; balance (allow pt to experience "planned failures" in her balance to reinforce why she should be using RW at home when alone); assess her ?safe use of RW ; ?assess for BPPV vs central nystagmus with rolling and/or supine to sit; NOTE-anticipate pt may decr to 1x/week or d/c prior to 8 weeks    Recommended Other Services  none (no OT needs identified; ?SLP for cognition although pt was discharged from [facility] SLP--will monitor    Consulted and Agree with Plan of Care  Patient;Family member/caregiver    Family Member Consulted  husband, Phil       Patient will benefit from skilled therapeutic intervention in order to improve the following deficits and impairments:  Abnormal gait, Decreased balance, Decreased coordination, Decreased knowledge of use of DME, Decreased safety awareness, Decreased strength, Dizziness, Impaired perceived functional ability, Postural dysfunction, Other (comment)(nystagmus noted with bed mobility--to be further assessed)  Visit Diagnosis: SAH (subarachnoid hemorrhage) ([place]) - Plan: PT plan of care cert/re-cert  Unsteadiness on feet - Plan: PT plan of care cert/re-cert  Other abnormalities of gait and mobility - Plan: PT plan of care cert/re-cert  Dizziness and giddiness - Plan: PT plan of care cert/re-cert  Muscle weakness (generalized) - Plan: PT plan of care cert/re-cert     Problem List Patient Active Problem List   Diagnosis Date Noted  . Essential hypertension 03/12/2018  . Cognitive deficit, post-stroke   . Hypertension   . Hypoalbuminemia due to protein-calorie malnutrition ([place])   . Urinary retention   .  Seizure prophylaxis   . SAH (subarachnoid hemorrhage) ([place]) 12/26/2017  . Subarachnoid hemorrhage due to ruptured aneurysm ([place]) 11/26/2017    Rexanne Mano, PT 03/25/2018, 3:09 PM  [facility] [address] [place], [place], [address] Phone: [phone]   Fax:  [phone]  Name: Sheryl James MRN: [id number] Date of Birth: [birth year]

## 2018-03-26 DIAGNOSIS — J343 Hypertrophy of nasal turbinates: Secondary | ICD-10-CM | POA: Diagnosis not present

## 2018-03-26 DIAGNOSIS — D37030 Neoplasm of uncertain behavior of the parotid salivary glands: Secondary | ICD-10-CM | POA: Diagnosis not present

## 2018-04-09 ENCOUNTER — Encounter: Payer: Self-pay | Admitting: Physical Therapy

## 2018-04-09 ENCOUNTER — Ambulatory Visit: Payer: 59 | Attending: Physical Medicine & Rehabilitation | Admitting: Physical Therapy

## 2018-04-09 DIAGNOSIS — R2681 Unsteadiness on feet: Secondary | ICD-10-CM | POA: Insufficient documentation

## 2018-04-09 DIAGNOSIS — R42 Dizziness and giddiness: Secondary | ICD-10-CM | POA: Diagnosis not present

## 2018-04-09 DIAGNOSIS — M6281 Muscle weakness (generalized): Secondary | ICD-10-CM | POA: Diagnosis not present

## 2018-04-09 DIAGNOSIS — R2689 Other abnormalities of gait and mobility: Secondary | ICD-10-CM | POA: Insufficient documentation

## 2018-04-09 NOTE — Patient Instructions (Addendum)
Access Code: [email or web address]  URL: [email or web address]  Date: 04/09/2018  Prepared by: Barry Brunner   Exercises  Seated Gastroc Stretch with Strap - 3 reps - 1 sets - 30 seconds hold - 2x daily - 7x weekly  Heel Toe Raises with Counter Support - 10 reps - 1 sets - 5 seconds hold - 2x daily - 7x weekly  Standing Hip Abduction with Counter Support - 10 reps - 1 sets - 5 seconds hold - 2x daily - 7x weekly    Sit to Stand Transfers:  1. Scoot out to the edge of the chair 2. Place your feet flat on the floor, shoulder width apart.  Make sure your feet are tucked just under your knees. 3. Lean forward (nose over toes) with momentum, and stand up tall with your best posture.  If you need to use your arms, use them as a quick boost up to stand. 4. If you are in a low or soft chair, you can lean back and then forward up to stand, in order to get more momentum. 5. Once you are standing, make sure you are looking ahead and standing tall.  To sit down:  1. Back up until you feel the chair behind your legs. 2. Bend at you hips, reaching  Back for you chair, if needed, then slowly squat to sit down on your chair.  HIP / KNEE: Extension - Sit to Stand    Sitting, lean chest forward, raise hips up from surface. Straighten hips and knees. Weight bear equally on left and right sides. Backs of legs should not push off surface. __10_ reps per set, _2__ sets per day, __7_ days per week Use assistive device as needed.  Copyright  VHI. All rights reserved.

## 2018-04-09 NOTE — Therapy (Addendum)
[facility] [address] [place], [place], [address] Phone: [phone]   Fax:  [phone]  Physical Therapy Treatment  Patient Details  Name: Sheryl James MRN: [id number] Date of Birth: [birth year] Referring Provider (PT): Jamse Arn, MD   Encounter Date: 04/09/2018  PT End of Session - 04/09/18 0756    Visit Number  2    Number of Visits  17    Date for PT Re-Evaluation  05/24/18    Authorization Type  [employer]; $10 copay; VL=60 combined pt, ot, slp (zero used at time of PT eval)    Authorization - Visit Number  2    Authorization - Number of Visits  60    PT Start Time  0800    PT Stop Time  0850    PT Time Calculation (min)  50 min    Activity Tolerance  Patient tolerated treatment well    Behavior During Therapy  [facility] for tasks assessed/performed       History reviewed. No pertinent past medical history.  Past Surgical History:  Procedure Laterality Date  . IR ANGIO INTRA EXTRACRAN SEL INTERNAL CAROTID BILAT MOD SED  11/27/2017  . IR ANGIO VERTEBRAL SEL VERTEBRAL UNI L MOD SED  11/27/2017  . IR ANGIOGRAM FOLLOW UP STUDY  11/27/2017  . IR ANGIOGRAM FOLLOW UP STUDY  11/27/2017  . IR ANGIOGRAM FOLLOW UP STUDY  11/27/2017  . IR ANGIOGRAM FOLLOW UP STUDY  11/27/2017  . IR TRANSCATH/EMBOLIZ  11/27/2017  . IR US GUIDE VASC ACCESS RIGHT  11/27/2017  . RADIOLOGY WITH ANESTHESIA N/A 11/27/2017   Procedure: IR WITH ANESTHESIA;  Surgeon: Consuella Lose, MD;  Location: [facility];  Service: Radiology;  Laterality: N/A;    There were no vitals filed for this visit.  Subjective Assessment - 04/09/18 0757    Subjective  No falls. One near fall going up steps--distracted and fell backwards into her husband. "You're not going to make me lie down? That's what makes me crazy dizzy")    Patient is accompained by:  Family member   husband, Phil   Pertinent History  none    Patient Stated Goals  be able to walk safely without a  device    Currently in Pain?  No/denies             Vestibular Assessment - 04/09/18 1350      Vestibular Assessment   General Observation  pt reported "dizziness" occurs with rolling in bed and when goes to sit upright at side of bed      Symptom Behavior   Type of Dizziness  "Funny feeling in head"    Frequency of Dizziness  daily    Duration of Dizziness  seconds    Aggravating Factors  Supine to sit;Rolling to right;Rolling to left    Relieving Factors  Rest      Positional Testing   Sidelying Test  Sidelying Right;Sidelying Left      Sidelying Right   Sidelying Right Duration  3    Sidelying Right Symptoms  --   right rotary, too brief to assess up or down-beating     Sidelying Left   Sidelying Left Duration  15    Sidelying Left Symptoms  Upbeat, left rotatory nystagmus               [facility] Adult PT Treatment/Exercise - 04/09/18 1342      Transfers   Transfers  Sit to Stand;Stand to Sit  Sit to Stand  4: Min guard;4: Min assist;With upper extremity assist;Without upper extremity assist;Multiple attempts   simulated sofa (low soft surface)   Sit to Stand Details (indicate cue type and reason)  instructed in scoot to edge, tuck feet, and anterior wt-shift; progressed from mat table height to ~16" soft surface (required min assist and hands on her knees) then ~18" soft surface with minguard    Stand to Sit  4: Min guard    Number of Reps  Other reps (comment)   5, 5, 10     Ambulation/Gait   Ambulation/Gait  Yes    Ambulation/Gait Assistance  4: Min guard;4: Min assist    Ambulation/Gait Assistance Details  with RW vc for proximity to RW and safety with turns; no device slightly unsteady with head turns, cannot vary speed reporting she feels too unsteady to attempt    Ambulation Distance (Feet)  120 Feet   120, 100   Assistive device  Rolling walker;None    Gait Pattern  Step-through pattern;Decreased step length - right;Decreased stance time - left     Ambulation Surface  Level;Indoor    Gait Comments  added horizontal and each diagonal head turns with instability and drift off path noted      Posture/Postural Control   Posture/Postural Control  Postural limitations    Postural Limitations  Rounded Shoulders;Forward head      Self-Care   Self-Care  Other Self-Care Comments   see education      Educated patient on the following HEP: Access Code: [email or web address]  URL: [email or web address]  Date: 04/09/2018  Prepared by: Barry Brunner   Exercises  Seated Gastroc Stretch with Strap - 3 reps - 1 sets - 30 seconds hold - 2x daily - 7x weekly  Heel Toe Raises with Counter Support - 10 reps - 1 sets - 5 seconds hold - 2x daily - 7x weekly  Standing Hip Abduction with Counter Support - 10 reps - 1 sets - 5 seconds hold - 2x daily - 7x weekly   Vestibular Treatment/Exercise - 04/09/18 1352      Vestibular Treatment/Exercise   Vestibular Treatment Provided  Canalith Repositioning    Canalith Repositioning  Epley Manuever Left       EPLEY MANUEVER LEFT   Number of Reps   1    Overall Response   Improved Symptoms     RESPONSE DETAILS LEFT  did not have time to re-check dix-hallpike; pt reported less dizzy when she went from sit to stand; husband reported she appeared more steady than when she stands from EOB at home            PT Education - 04/09/18 1354    Education Details  recommend using RW whenever husband is not at home; safe use of RW; she does not go up/down steps if he is away at work; educated in [employer]) Educated  Patient;Spouse    Methods  Explanation;Demonstration;Tactile cues;Verbal cues;Handout    Comprehension  Verbalized understanding;Returned demonstration;Verbal cues required;Tactile cues required;Need further instruction       PT Short Term Goals - 03/25/18 1454      PT SHORT TERM GOAL #1   Title  Patient will be independent with strengthening and stretching exercises and supervision for  balance exercises for HEP (Target for all STGs 04/24/2018)    Time  4    Period  Weeks    Status  New    Target Date  04/24/18  PT SHORT TERM GOAL #2   Title  Patient will ambulate 200 feet in simulated home environment (narrow spaces, doorways, etc) without a device with no LOB requiring physical assist to recover.     Time  4    Period  Weeks    Status  New      PT SHORT TERM GOAL #3   Title  Patient will improve gait velocity to >2.62 ft/sec for safe community ambulation    Baseline  03/25/18  2.28 ft/sec    Time  4    Period  Weeks    Status  New      PT SHORT TERM GOAL #4   Title  Patient will score >=23/30 on FGA to demonstrate lesser fall risk    Baseline  03/25/18 18/30    Time  4    Period  Weeks    Status  New        PT Long Term Goals - 03/25/18 1458      PT LONG TERM GOAL #1   Title  Patient will be independent with HEP (including safe with balance exercises) in preparation for discharge (Target for all LTGs 05/24/2018)    Time  8    Period  Weeks    Status  New    Target Date  05/24/18      PT LONG TERM GOAL #2   Title  Patient will ambulate 1000 feet in combination of simulated home environment (narrow spaces, doorways, etc) and outdoor unlevel, paved surfaces without a device independently    Time  8    Period  Weeks    Status  New      PT LONG TERM GOAL #3   Title  Patient will improve gait velocity to >=2.85 ft/sec (norm for age)    Time  8    Period  Weeks    Status  New      PT LONG TERM GOAL #4   Title  Patient will score >=25/30 on FGA to demonstrate lesser fall risk    Time  8    Period  Weeks    Status  New      PT LONG TERM GOAL #5   Title  Patient will decrease 5x STS to <12 seconds     Baseline  03/25/18 18.4 sec    Time  8    Period  Weeks    Status  New            Plan - 04/09/18 1356    Clinical Impression Statement  Patient continues with decr safety awareness (and memory as she did not recall near fall on the steps  that husband described). Session focused on safe use of RW, gait without RW with challenges that caused instability to attempt to improve her awareness of deficits (limited success with this approach); initiaiting HEP for balance and strengthening, and assessed and treated for BPPV. Pt can continue to benefit from PT to improve safety with mobility     Rehab Potential  Good    PT Frequency  2x / week    PT Duration  8 weeks    PT Treatment/Interventions  ADLs/Self Care Home Management;Canalith Repostioning;DME Instruction;Balance training;Therapeutic exercise;Therapeutic activities;Functional mobility training;Stair training;Gait training;Neuromuscular re-education;Cognitive remediation;Patient/family education;Passive range of motion;Vestibular;Visual/perceptual remediation/compensation    PT Next Visit Plan  chk understanding and did she do HEP: focus on estab HEP; chk for left posterior BPPV and ?horizontal canal??: balance (allow pt to experience "planned failures"  in her balance to reinforce why she should be using RW at home when alone); assess her ?safe use of RW ; hamstring stretches;  NOTE-anticipate pt may decr to 1x/week or d/c prior to 8 weeks    Consulted and Agree with Plan of Care  Patient;Family member/caregiver    Family Member Consulted  husband, Abbe [place]       Patient will benefit from skilled therapeutic intervention in order to improve the following deficits and impairments:  Abnormal gait, Decreased balance, Decreased coordination, Decreased knowledge of use of DME, Decreased safety awareness, Decreased strength, Dizziness, Impaired perceived functional ability, Postural dysfunction, Other (comment)(nystagmus noted with bed mobility--to be further assessed)  Visit Diagnosis: Unsteadiness on feet  Muscle weakness (generalized)  Dizziness and giddiness     Problem List Patient Active Problem List   Diagnosis Date Noted  . Essential hypertension 03/12/2018  . Cognitive  deficit, post-stroke   . Hypertension   . Hypoalbuminemia due to protein-calorie malnutrition ([place])   . Urinary retention   . Seizure prophylaxis   . SAH (subarachnoid hemorrhage) ([place]) 12/26/2017  . Subarachnoid hemorrhage due to ruptured aneurysm ([place]) 11/26/2017    Rexanne Mano, PT 04/09/2018, 7:20 PM  [facility] [address] [place], [place], [address] Phone: [phone]   Fax:  [phone]  Name: Sheryl James MRN: [id number] Date of Birth: December 28, 1955

## 2018-04-13 ENCOUNTER — Encounter: Payer: Self-pay | Admitting: Physical Therapy

## 2018-04-13 ENCOUNTER — Ambulatory Visit: Payer: 59 | Admitting: Physical Therapy

## 2018-04-13 DIAGNOSIS — R2689 Other abnormalities of gait and mobility: Secondary | ICD-10-CM

## 2018-04-13 DIAGNOSIS — R42 Dizziness and giddiness: Secondary | ICD-10-CM

## 2018-04-13 DIAGNOSIS — R2681 Unsteadiness on feet: Secondary | ICD-10-CM | POA: Diagnosis not present

## 2018-04-13 NOTE — Therapy (Signed)
[facility] [address] [place], [place], [address] Phone: [phone]   Fax:  [phone]  Physical Therapy Treatment  Patient Details  Name: Sheryl James MRN: [id number] Date of Birth: 28-Oct-1955 Referring Provider (PT): Jamse Arn, MD   Encounter Date: 04/13/2018  PT End of Session - 04/13/18 0847    Visit Number  3    Number of Visits  17    Date for PT Re-Evaluation  05/24/18    Authorization Type  [employer]; $10 copay; VL=60 combined pt, ot, slp (zero used at time of PT eval)    Authorization - Visit Number  3    Authorization - Number of Visits  60    PT Start Time  2706    PT Stop Time  0929    PT Time Calculation (min)  42 min    Activity Tolerance  Patient tolerated treatment well    Behavior During Therapy  [facility] for tasks assessed/performed       History reviewed. No pertinent past medical history.  Past Surgical History:  Procedure Laterality Date  . IR ANGIO INTRA EXTRACRAN SEL INTERNAL CAROTID BILAT MOD SED  11/27/2017  . IR ANGIO VERTEBRAL SEL VERTEBRAL UNI L MOD SED  11/27/2017  . IR ANGIOGRAM FOLLOW UP STUDY  11/27/2017  . IR ANGIOGRAM FOLLOW UP STUDY  11/27/2017  . IR ANGIOGRAM FOLLOW UP STUDY  11/27/2017  . IR ANGIOGRAM FOLLOW UP STUDY  11/27/2017  . IR TRANSCATH/EMBOLIZ  11/27/2017  . IR US GUIDE VASC ACCESS RIGHT  11/27/2017  . RADIOLOGY WITH ANESTHESIA N/A 11/27/2017   Procedure: IR WITH ANESTHESIA;  Surgeon: Consuella Lose, MD;  Location: [facility];  Service: Radiology;  Laterality: N/A;    There were no vitals filed for this visit.  Subjective Assessment - 04/13/18 0847    Subjective  No falls or near falls. Her thighs were very sore after last session. Has not had spinning dizziness since Epley maneuver.     Patient is accompained by:  Family member   husband, Phil   Pertinent History  none    Patient Stated Goals  be able to walk safely without a device    Currently in Pain?  No/denies              Vestibular Assessment - 04/13/18 0917      Positional Testing   Dix-Hallpike  Dix-Hallpike Right;Dix-Hallpike Left    Horizontal Canal Testing  Horizontal Canal Right;Horizontal Canal Left      Dix-Hallpike Right   Dix-Hallpike Right Duration  30    Dix-Hallpike Right Symptoms  Upbeat, right rotatory nystagmus      Dix-Hallpike Left   Dix-Hallpike Left Duration  0    Dix-Hallpike Left Symptoms  No nystagmus        Horizontal Canal Right   Horizontal Canal Right Duration  0    Horizontal Canal Right Symptoms  Normal      Horizontal Canal Left   Horizontal Canal Left Duration  0    Horizontal Canal Left Symptoms  Normal               [facility] Adult PT Treatment/Exercise - 04/13/18 0001      Transfers   Transfers  Sit to Stand;Stand to Sit    Sit to Stand  4: Min guard;Without upper extremity assist;From bed    Sit to Stand Details (indicate cue type and reason)  vc for scoot to edge and anterior wt-shift  Stand to Sit  5: Supervision      Ambulation/Gait   Ambulation/Gait Assistance  4: Min guard    Ambulation/Gait Assistance Details  with head turns, ball toss, moving ball in front of her eyes horizontally, vertically with no imbalance    Ambulation Distance (Feet)  200 Feet    Assistive device  None    Gait Pattern  Step-through pattern;Decreased stride length;Decreased arm swing - right;Decreased arm swing - left    Ambulation Surface  Level;Indoor      Vestibular Treatment/Exercise - 04/13/18 0917      Vestibular Treatment/Exercise   Vestibular Treatment Provided  Canalith Repositioning    Canalith Repositioning  Epley Manuever Right       EPLEY MANUEVER RIGHT   Number of Reps   1    Overall Response  Symptoms Resolved         Balance Exercises - 04/13/18 1309      Balance Exercises: Standing   Standing Eyes Opened  Narrow base of support (BOS);Head turns;Foam/compliant surface;Solid surface    Standing Eyes Closed  Narrow base  of support (BOS);Foam/compliant surface;Solid surface    Tandem Gait  Forward;Upper extremity support;4 reps    Retro Gait  Upper extremity support;4 reps    Heel Raises Limitations  light UE support x 10 alternating with    Toe Raise Limitations  light UE support x 10 alternating with          PT Short Term Goals - 03/25/18 1454      PT SHORT TERM GOAL #1   Title  Patient will be independent with strengthening and stretching exercises and supervision for balance exercises for HEP (Target for all STGs 04/24/2018)    Time  4    Period  Weeks    Status  New    Target Date  04/24/18      PT SHORT TERM GOAL #2   Title  Patient will ambulate 200 feet in simulated home environment (narrow spaces, doorways, etc) without a device with no LOB requiring physical assist to recover.     Time  4    Period  Weeks    Status  New      PT SHORT TERM GOAL #3   Title  Patient will improve gait velocity to >2.62 ft/sec for safe community ambulation    Baseline  03/25/18  2.28 ft/sec    Time  4    Period  Weeks    Status  New      PT SHORT TERM GOAL #4   Title  Patient will score >=23/30 on FGA to demonstrate lesser fall risk    Baseline  03/25/18 18/30    Time  4    Period  Weeks    Status  New        PT Long Term Goals - 03/25/18 1458      PT LONG TERM GOAL #1   Title  Patient will be independent with HEP (including safe with balance exercises) in preparation for discharge (Target for all LTGs 05/24/2018)    Time  8    Period  Weeks    Status  New    Target Date  05/24/18      PT LONG TERM GOAL #2   Title  Patient will ambulate 1000 feet in combination of simulated home environment (narrow spaces, doorways, etc) and outdoor unlevel, paved surfaces without a device independently    Time  8    Period  Weeks    Status  New      PT LONG TERM GOAL #3   Title  Patient will improve gait velocity to >=2.85 ft/sec (norm for age)    Time  8    Period  Weeks    Status  New      PT LONG  TERM GOAL #4   Title  Patient will score >=25/30 on FGA to demonstrate lesser fall risk    Time  8    Period  Weeks    Status  New      PT LONG TERM GOAL #5   Title  Patient will decrease 5x STS to <12 seconds     Baseline  03/25/18 18.4 sec    Time  8    Period  Weeks    Status  New            Plan - 04/13/18 1431    Clinical Impression Statement  Patient/husband report patient's dizziness has been nearly resolved after Epley maneuver last visit. She still feels slightly off when she first gets up in the morning, however her balance is much better. Reassessed for lt posterior canal BPPV with lt Hallpike-Dix negative. Rt Hallpike-Dix positive and completed rt Epley with follow-up Hallpike-Dix negative. Session also focused on gait training with head turns and visual stimuli and balance training. Patient did well with all balance activities and hopeful her balance will continue to improve with resolution of BPPV.    Rehab Potential  Good    PT Frequency  2x / week    PT Duration  8 weeks    PT Treatment/Interventions  ADLs/Self Care Home Management;Canalith Repostioning;DME Instruction;Balance training;Therapeutic exercise;Therapeutic activities;Functional mobility training;Stair training;Gait training;Neuromuscular re-education;Cognitive remediation;Patient/family education;Passive range of motion;Vestibular;Visual/perceptual remediation/compensation    PT Next Visit Plan  balance and gait training; LE strengthening; update HEP and focus on establishing walking or other aerobic exercise NOTE-anticipate pt may decr to 1x/week or d/c prior to 8 weeks    PT Home Exercise Plan  [email or web address]; also sit to stand from [facility]    Consulted and Agree with Plan of Care  Patient;Family member/caregiver    Family Member Consulted  husband, Abbe [place]       Patient will benefit from skilled therapeutic intervention in order to improve the following deficits and impairments:  Abnormal gait, Decreased balance,  Decreased coordination, Decreased knowledge of use of DME, Decreased safety awareness, Decreased strength, Dizziness, Impaired perceived functional ability, Postural dysfunction, Other (comment)(nystagmus noted with bed mobility--to be further assessed)  Visit Diagnosis: Unsteadiness on feet  Dizziness and giddiness  Other abnormalities of gait and mobility     Problem List Patient Active Problem List   Diagnosis Date Noted  . Essential hypertension 03/12/2018  . Cognitive deficit, post-stroke   . Hypertension   . Hypoalbuminemia due to protein-calorie malnutrition ([place])   . Urinary retention   . Seizure prophylaxis   . SAH (subarachnoid hemorrhage) ([place]) 12/26/2017  . Subarachnoid hemorrhage due to ruptured aneurysm ([place]) 11/26/2017    Rexanne Mano, PT 04/13/2018, 2:39 PM  [facility] [address] [place], [place], [address] Phone: [phone]   Fax:  [phone]  Name: Sheryl James MRN: [id number] Date of Birth: August 10, 1955

## 2018-04-17 ENCOUNTER — Ambulatory Visit: Payer: 59 | Admitting: Physical Therapy

## 2018-04-22 ENCOUNTER — Encounter: Payer: Self-pay | Admitting: Physical Therapy

## 2018-04-22 ENCOUNTER — Ambulatory Visit: Payer: 59 | Admitting: Physical Therapy

## 2018-04-22 DIAGNOSIS — R2681 Unsteadiness on feet: Secondary | ICD-10-CM | POA: Diagnosis not present

## 2018-04-22 DIAGNOSIS — R2689 Other abnormalities of gait and mobility: Secondary | ICD-10-CM
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
## 2018-04-28 ENCOUNTER — Ambulatory Visit: Payer: 59 | Attending: Physical Medicine & Rehabilitation | Admitting: Physical Therapy

## 2018-04-28 DIAGNOSIS — R2681 Unsteadiness on feet: Secondary | ICD-10-CM | POA: Diagnosis not present

## 2018-04-28 DIAGNOSIS — R2689 Other abnormalities of gait and mobility: Secondary | ICD-10-CM | POA: Diagnosis not present

## 2018-04-28 DIAGNOSIS — M6281 Muscle weakness (generalized): Secondary | ICD-10-CM | POA: Insufficient documentation

## 2018-04-29 NOTE — Therapy (Signed)
[facility] [address] [place], [place], [address] Phone: [phone]   Fax:  [phone]  Physical Therapy Treatment  Patient Details  Name: Sheryl James MRN: [id number] Date of Birth: 01/19/1956 Referring Provider (PT): Jamse Arn, MD   Encounter Date: 04/28/2018  PT End of Session - 04/29/18 0814    Visit Number  5    Number of Visits  17    Date for PT Re-Evaluation  05/24/18    Authorization Type  [employer]; $10 copay; VL=60 combined pt, ot, slp (zero used at time of PT eval)    Authorization - Visit Number  5    Authorization - Number of Visits  60    PT Start Time  [phone]    PT Stop Time  0930    PT Time Calculation (min)  44 min    Equipment Utilized During Treatment  Gait belt    Activity Tolerance  Patient tolerated treatment well    Behavior During Therapy  [facility] for tasks assessed/performed       No past medical history on file.  Past Surgical History:  Procedure Laterality Date  . IR ANGIO INTRA EXTRACRAN SEL INTERNAL CAROTID BILAT MOD SED  11/27/2017  . IR ANGIO VERTEBRAL SEL VERTEBRAL UNI L MOD SED  11/27/2017  . IR ANGIOGRAM FOLLOW UP STUDY  11/27/2017  . IR ANGIOGRAM FOLLOW UP STUDY  11/27/2017  . IR ANGIOGRAM FOLLOW UP STUDY  11/27/2017  . IR ANGIOGRAM FOLLOW UP STUDY  11/27/2017  . IR TRANSCATH/EMBOLIZ  11/27/2017  . IR US GUIDE VASC ACCESS RIGHT  11/27/2017  . RADIOLOGY WITH ANESTHESIA N/A 11/27/2017   Procedure: IR WITH ANESTHESIA;  Surgeon: Consuella Lose, MD;  Location: [facility];  Service: Radiology;  Laterality: N/A;    There were no vitals filed for this visit.  Subjective Assessment - 04/29/18 0754    Subjective  Pt reports she is doing better but says balance is still a problem for her    Patient is accompained by:  Family member   son   Pertinent History  none    Patient Stated Goals  be able to walk safely without a device    Currently in Pain?  No/denies                        [facility] Adult PT Treatment/Exercise - 04/29/18 0001      Transfers   Transfers  Sit to Stand    Sit to Stand  5: Supervision    Number of Reps  10 reps    Comments  feet on blue Airex - from high low mat table; no UE support used      Ambulation/Gait   Ambulation/Gait  Yes    Ambulation/Gait Assistance  5: Supervision    Ambulation/Gait Assistance Details  2 laps around track with cues to attempt to increase speed and to increase step length    Ambulation Distance (Feet)  230 Feet    Assistive device  None    Gait Pattern  Step-through pattern    Ambulation Surface  Level;Indoor      Exercises   Exercises  Knee/Hip;Ankle      Knee/Hip Exercises: Standing   Forward Step Up  Right;Left;1 set;10 reps;Hand Hold: 1;Step Height: 6"   10 reps each leg - initial 3 reps with bil. UE support         Balance Exercises - 04/29/18 [phone]  Balance Exercises: Standing   Tandem Stance  Eyes open;1 rep;10 secs    Rockerboard  Anterior/posterior;EO;Other reps (comment)   30 reps - 3 sets 10 with decreasing UE support    Tandem Gait  Forward;Intermittent upper extremity support;2 reps   inside // bars   Other Standing Exercises  Marching on blue Airex 10 reps with cues to perform slowly to challenge SLS on each stance leg      Pt performed standing forward kicks on Airex pad 10 reps each leg with mod to max assist for recovery of LOB Alternate tap downs from AIrex to floor for improved SLS on compliant surface  Tap ups from floor to 2nd step 5 reps each leg without UE support with CGA  Pt performed marching on incline/decline with no head turns initially, then adding horizontal head turns 10 reps each leg with mod  Assist for recovery of LOB  Pt amb. 115' around track tossing ball for multi-tasking with gait - CGA to min assist needed for safety      PT Short Term Goals - 04/22/18 1311      PT SHORT TERM GOAL #1   Title  Patient will be  independent with strengthening and stretching exercises and supervision for balance exercises for HEP (Target for all STGs 04/24/2018)    Time  4    Period  Weeks    Status  Achieved    Target Date  04/24/18      PT SHORT TERM GOAL #2   Title  Patient will ambulate 200 feet in simulated home environment (narrow spaces, doorways, etc) without a device with no LOB requiring physical assist to recover.     Time  4    Period  Weeks    Status  Achieved      PT SHORT TERM GOAL #3   Title  Patient will improve gait velocity to >2.62 ft/sec for safe community ambulation    Baseline  03/25/18  2.28 ft/sec; 04/22/18 2.78 ft/sec    Time  4    Period  Weeks    Status  Achieved      PT SHORT TERM GOAL #4   Title  Patient will score >=23/30 on FGA to demonstrate lesser fall risk    Baseline  03/25/18 18/30; 04/22/18  20/30    Time  4    Period  Weeks    Status  Partially Met        PT Long Term Goals - 03/25/18 1458      PT LONG TERM GOAL #1   Title  Patient will be independent with HEP (including safe with balance exercises) in preparation for discharge (Target for all LTGs 05/24/2018)    Time  8    Period  Weeks    Status  New    Target Date  05/24/18      PT LONG TERM GOAL #2   Title  Patient will ambulate 1000 feet in combination of simulated home environment (narrow spaces, doorways, etc) and outdoor unlevel, paved surfaces without a device independently    Time  8    Period  Weeks    Status  New      PT LONG TERM GOAL #3   Title  Patient will improve gait velocity to >=2.85 ft/sec (norm for age)    Time  8    Period  Weeks    Status  New      PT LONG TERM GOAL #4  Title  Patient will score >=25/30 on FGA to demonstrate lesser fall risk    Time  8    Period  Weeks    Status  New      PT LONG TERM GOAL #5   Title  Patient will decrease 5x STS to <12 seconds     Baseline  03/25/18 18.4 sec    Time  8    Period  Weeks    Status  New            Plan - 04/29/18 0815     Clinical Impression Statement  Pt had moderate difficulty maintaining balance on compliant surfaces, leaning posteriorly and at times needed max assist for recovery of LOB.  Pt's gait speed is decreased with pt minimally increasing speed with cues to attempt to walk faster and to increase step length.  Basic balance skills on non-compliant surfaces are good.      Rehab Potential  Good    PT Frequency  2x / week    PT Duration  8 weeks    PT Treatment/Interventions  ADLs/Self Care Home Management;Canalith Repostioning;DME Instruction;Balance training;Therapeutic exercise;Therapeutic activities;Functional mobility training;Stair training;Gait training;Neuromuscular re-education;Cognitive remediation;Patient/family education;Passive range of motion;Vestibular;Visual/perceptual remediation/compensation    PT Next Visit Plan  update HEP and focus on establishing walking or other aerobic exercise plan (NOTE - Jeani Hawking (primary PT) states she will take care of this when she sees her next):  balance and multi-tasking with gait:  LE strengthening    PT Home Exercise Plan  [id number]; also sit to stand from [facility]    Consulted and Agree with Plan of Care  Patient;Family member/caregiver    Family Member Consulted  son       Patient will benefit from skilled therapeutic intervention in order to improve the following deficits and impairments:  Abnormal gait, Decreased balance, Decreased coordination, Decreased knowledge of use of DME, Decreased safety awareness, Decreased strength, Dizziness, Impaired perceived functional ability, Postural dysfunction, Other (comment)  Visit Diagnosis: Unsteadiness on feet  Other abnormalities of gait and mobility  Muscle weakness (generalized)     Problem List Patient Active Problem List   Diagnosis Date Noted  . Essential hypertension 03/12/2018  . Cognitive deficit, post-stroke   . Hypertension   . Hypoalbuminemia due to protein-calorie malnutrition ([place])   . Urinary  retention   . Seizure prophylaxis   . SAH (subarachnoid hemorrhage) ([place]) 12/26/2017  . Subarachnoid hemorrhage due to ruptured aneurysm ([place]) 11/26/2017    [place], [place], PT 04/29/2018, 8:22 AM  [facility] [address] [place], [place], [address] Phone: [phone]   Fax:  [phone]  Name: SANI LOISEAU MRN: [id number] Date of Birth: 1955-10-25

## 2018-05-01 ENCOUNTER — Ambulatory Visit: Payer: 59 | Admitting: Physical Therapy

## 2018-05-01 ENCOUNTER — Encounter: Payer: Self-pay | Admitting: Physical Therapy

## 2018-05-01 DIAGNOSIS — R2681 Unsteadiness on feet: Secondary | ICD-10-CM | POA: Diagnosis not present

## 2018-05-01 DIAGNOSIS — R2689 Other abnormalities of gait and mobility: Secondary | ICD-10-CM

## 2018-05-01 DIAGNOSIS — M6281 Muscle weakness (generalized): Secondary | ICD-10-CM

## 2018-05-01 NOTE — Therapy (Signed)
[facility] [address] [place], [place], [address] Phone: [phone]   Fax:  [phone]  Physical Therapy Treatment  Patient Details  Name: Sheryl James MRN: [id number] Date of Birth: August 03, 1955 Referring Provider (PT): Jamse Arn, MD   Encounter Date: 05/01/2018  PT End of Session - 05/01/18 1134    Visit Number  6    Number of Visits  17    Date for PT Re-Evaluation  05/24/18    Authorization Type  [employer]; $10 copay; VL=60 combined pt, ot, slp (zero used at time of PT eval)    Authorization - Visit Number  6    Authorization - Number of Visits  60    PT Start Time  0848    PT Stop Time  0932    PT Time Calculation (min)  44 min    Activity Tolerance  Patient tolerated treatment well    Behavior During Therapy  [facility] for tasks assessed/performed       History reviewed. No pertinent past medical history.  Past Surgical History:  Procedure Laterality Date  . IR ANGIO INTRA EXTRACRAN SEL INTERNAL CAROTID BILAT MOD SED  11/27/2017  . IR ANGIO VERTEBRAL SEL VERTEBRAL UNI L MOD SED  11/27/2017  . IR ANGIOGRAM FOLLOW UP STUDY  11/27/2017  . IR ANGIOGRAM FOLLOW UP STUDY  11/27/2017  . IR ANGIOGRAM FOLLOW UP STUDY  11/27/2017  . IR ANGIOGRAM FOLLOW UP STUDY  11/27/2017  . IR TRANSCATH/EMBOLIZ  11/27/2017  . IR US GUIDE VASC ACCESS RIGHT  11/27/2017  . RADIOLOGY WITH ANESTHESIA N/A 11/27/2017   Procedure: IR WITH ANESTHESIA;  Surgeon: Consuella Lose, MD;  Location: [facility];  Service: Radiology;  Laterality: N/A;    There were no vitals filed for this visit.  Subjective Assessment - 05/01/18 1132    Subjective  husband and patient asking why her schedule has changed. Feels as if she was guarded too much last session and states "If I'm going to fall let me fall"    Patient is accompained by:  Family member    Pertinent History  none    Patient Stated Goals  be able to walk safely without a device    Currently in  Pain?  No/denies                            Balance Exercises - 05/01/18 1138      Balance Exercises: Standing   Tandem Stance  Eyes open;Intermittent upper extremity support   1.1# ball: overhead press, chest press - up to 6 reps   Rockerboard  Anterior/posterior;EO;Head turns   multiple reps for up to ~15-20 sec; int UE support   Cone Rotation Limitations  cone taps standing on AirEx: 3 cones in semicircle focusing on crossing midline; 2 cones with crossbody double tap - intermittent posterior lean    Other Standing Exercises  high knee marching on AirEx - mild LOB; gait with dual task: naming of fruits/vegetables - considerable decrease in gait speed and step length; standing with posterior lean into PT hands working on stepping strategy - heavy use of UE at // bars        PT Education - 05/01/18 1133    Education Details  education that it s not our goal to have patient fall but rather to challenge balance in various ways; patient asking PT clearance to begin driving - discussion that it is not  within my scope of practice to grant her this permission    Person(s) Educated  Patient    Methods  Explanation    Comprehension  Verbalized understanding       PT Short Term Goals - 04/22/18 1311      PT SHORT TERM GOAL #1   Title  Patient will be independent with strengthening and stretching exercises and supervision for balance exercises for HEP (Target for all STGs 04/24/2018)    Time  4    Period  Weeks    Status  Achieved    Target Date  04/24/18      PT SHORT TERM GOAL #2   Title  Patient will ambulate 200 feet in simulated home environment (narrow spaces, doorways, etc) without a device with no LOB requiring physical assist to recover.     Time  4    Period  Weeks    Status  Achieved      PT SHORT TERM GOAL #3   Title  Patient will improve gait velocity to >2.62 ft/sec for safe community ambulation    Baseline  03/25/18  2.28 ft/sec; 04/22/18 2.78 ft/sec     Time  4    Period  Weeks    Status  Achieved      PT SHORT TERM GOAL #4   Title  Patient will score >=23/30 on FGA to demonstrate lesser fall risk    Baseline  03/25/18 18/30; 04/22/18  20/30    Time  4    Period  Weeks    Status  Partially Met        PT Long Term Goals - 03/25/18 1458      PT LONG TERM GOAL #1   Title  Patient will be independent with HEP (including safe with balance exercises) in preparation for discharge (Target for all LTGs 05/24/2018)    Time  8    Period  Weeks    Status  New    Target Date  05/24/18      PT LONG TERM GOAL #2   Title  Patient will ambulate 1000 feet in combination of simulated home environment (narrow spaces, doorways, etc) and outdoor unlevel, paved surfaces without a device independently    Time  8    Period  Weeks    Status  New      PT LONG TERM GOAL #3   Title  Patient will improve gait velocity to >=2.85 ft/sec (norm for age)    Time  8    Period  Weeks    Status  New      PT LONG TERM GOAL #4   Title  Patient will score >=25/30 on FGA to demonstrate lesser fall risk    Time  8    Period  Weeks    Status  New      PT LONG TERM GOAL #5   Title  Patient will decrease 5x STS to <12 seconds     Baseline  03/25/18 18.4 sec    Time  8    Period  Weeks    Status  New            Plan - 05/01/18 1134    Clinical Impression Statement  Patient doing well - seemingly initially frustrated regarding change in schedule. Session today focusing on challenging balance on compliant surfaces, working with narrow BOS, as well as dual task with gait. Requires consistent verbal cueing to reduce UE support at // bars. Noted  posterior lean with most activities. Min guard to Min A at times in session to maintain upright balance. Will continue to progress towards goals.     Rehab Potential  Good    PT Frequency  2x / week    PT Duration  8 weeks    PT Treatment/Interventions  ADLs/Self Care Home Management;Canalith Repostioning;DME  Instruction;Balance training;Therapeutic exercise;Therapeutic activities;Functional mobility training;Stair training;Gait training;Neuromuscular re-education;Cognitive remediation;Patient/family education;Passive range of motion;Vestibular;Visual/perceptual remediation/compensation    PT Next Visit Plan  update HEP and focus on establishing walking or other aerobic exercise plan (NOTE - Jeani Hawking (primary PT) states she will take care of this when she sees her next):  balance and multi-tasking with gait:  LE strengthening    PT Home Exercise Plan  [email or web address]; also sit to stand from [facility]    Consulted and Agree with Plan of Care  Patient;Family member/caregiver       Patient will benefit from skilled therapeutic intervention in order to improve the following deficits and impairments:  Abnormal gait, Decreased balance, Decreased coordination, Decreased knowledge of use of DME, Decreased safety awareness, Decreased strength, Dizziness, Impaired perceived functional ability, Postural dysfunction, Other (comment)  Visit Diagnosis: Unsteadiness on feet  Other abnormalities of gait and mobility  Muscle weakness (generalized)     Problem List Patient Active Problem List   Diagnosis Date Noted  . Essential hypertension 03/12/2018  . Cognitive deficit, post-stroke   . Hypertension   . Hypoalbuminemia due to protein-calorie malnutrition ([place])   . Urinary retention   . Seizure prophylaxis   . SAH (subarachnoid hemorrhage) ([place]) 12/26/2017  . Subarachnoid hemorrhage due to ruptured aneurysm ([place]) 11/26/2017    Lanney Gins, PT, DPT Supplemental Physical Therapist 05/01/18 11:41 AM Pager: [phone] Office: [facility] [address] [place], [place], [address] Phone: [phone]   Fax:  [phone]  Name: Sheryl James MRN: [id number] Date of Birth: Nov 25, 1955

## 2018-05-06 ENCOUNTER — Other Ambulatory Visit: Payer: Self-pay

## 2018-05-06 ENCOUNTER — Ambulatory Visit: Payer: 59 | Admitting: Physical Therapy

## 2018-05-06 ENCOUNTER — Encounter: Payer: Self-pay | Admitting: Physical Therapy

## 2018-05-06 DIAGNOSIS — R2681 Unsteadiness on feet: Secondary | ICD-10-CM

## 2018-05-06 DIAGNOSIS — M6281 Muscle weakness (generalized): Secondary | ICD-10-CM

## 2018-05-06 DIAGNOSIS — R2689 Other abnormalities of gait and mobility: Secondary | ICD-10-CM

## 2018-05-06 NOTE — Therapy (Signed)
[facility] [address] [place], [place], [address] Phone: [phone]   Fax:  [phone]  Physical Therapy Treatment  Patient Details  Name: Sheryl James MRN: [id number] Date of Birth: 11/23/1955 Referring Provider (PT): Jamse Arn, MD   Encounter Date: 05/06/2018  PT End of Session - 05/06/18 0803    Visit Number  7    Number of Visits  17    Date for PT Re-Evaluation  05/24/18    Authorization Type  [employer]; $10 copay; VL=60 combined pt, ot, slp (zero used at time of PT eval)    Authorization - Visit Number  7    Authorization - Number of Visits  60    PT Start Time  0802    PT Stop Time  0845    PT Time Calculation (min)  43 min    Activity Tolerance  Patient tolerated treatment well    Behavior During Therapy  [facility] for tasks assessed/performed       History reviewed. No pertinent past medical history.  Past Surgical History:  Procedure Laterality Date  . IR ANGIO INTRA EXTRACRAN SEL INTERNAL CAROTID BILAT MOD SED  11/27/2017  . IR ANGIO VERTEBRAL SEL VERTEBRAL UNI L MOD SED  11/27/2017  . IR ANGIOGRAM FOLLOW UP STUDY  11/27/2017  . IR ANGIOGRAM FOLLOW UP STUDY  11/27/2017  . IR ANGIOGRAM FOLLOW UP STUDY  11/27/2017  . IR ANGIOGRAM FOLLOW UP STUDY  11/27/2017  . IR TRANSCATH/EMBOLIZ  11/27/2017  . IR US GUIDE VASC ACCESS RIGHT  11/27/2017  . RADIOLOGY WITH ANESTHESIA N/A 11/27/2017   Procedure: IR WITH ANESTHESIA;  Surgeon: Consuella Lose, MD;  Location: [facility];  Service: Radiology;  Laterality: N/A;    There were no vitals filed for this visit.  Subjective Assessment - 05/06/18 0803    Subjective  No changes. Thinks some of her exercises could be upgraded.    Patient is accompained by:  Family member    Pertinent History  none    Patient Stated Goals  be able to walk safely without a device    Currently in Pain?  No/denies                       [facility] Adult PT Treatment/Exercise -  05/06/18 0839      Knee/Hip Exercises: Machines for Strengthening   Total Gym Leg Press  80# 10 reps x 3 sets      Knee/Hip Exercises: Standing   Heel Raises  Right;Left;1 set;10 reps;3 seconds   single leg at counter   Hip Abduction  Stengthening;Both;1 set;20 reps;Knee straight    Abduction Limitations  green band; pt sat to don/doff band       Sit to stand x 10 reps from simulated low sofa (14 inch box with 4 inch soft cushion on top); vc for scoot forward farther and setting feet under her and was able to do without use of UEs; vc for slow, controlled descent for strengthening   Balance Exercises - 05/06/18 1709      Balance Exercises: Standing   Standing Eyes Opened  Narrow base of support (BOS);Head turns;Solid surface   initially tandem (too hard); 1/2 tandem approp   Standing Eyes Closed  Narrow base of support (BOS);30 secs;Solid surface   mild incr sway   Gait with Head Turns  Forward    Retro Gait  5 reps    Marching Limitations  2 sec hold;  intermittent use of UEs      OTAGO PROGRAM   Heel Walking  Support    Toe Walk  Support    Heel Toe Walking Backward  --   in // bars, intermittent support; 5 lengths       PT Education - 05/06/18 1712    Education Details  updated HEP    Person(s) Educated  Patient    Methods  Explanation;Demonstration;Verbal cues;Handout    Comprehension  Verbalized understanding;Returned demonstration;Verbal cues required;Need further instruction       PT Short Term Goals - 04/22/18 1311      PT SHORT TERM GOAL #1   Title  Patient will be independent with strengthening and stretching exercises and supervision for balance exercises for HEP (Target for all STGs 04/24/2018)    Time  4    Period  Weeks    Status  Achieved    Target Date  04/24/18      PT SHORT TERM GOAL #2   Title  Patient will ambulate 200 feet in simulated home environment (narrow spaces, doorways, etc) without a device with no LOB requiring physical assist to  recover.     Time  4    Period  Weeks    Status  Achieved      PT SHORT TERM GOAL #3   Title  Patient will improve gait velocity to >2.62 ft/sec for safe community ambulation    Baseline  03/25/18  2.28 ft/sec; 04/22/18 2.78 ft/sec    Time  4    Period  Weeks    Status  Achieved      PT SHORT TERM GOAL #4   Title  Patient will score >=23/30 on FGA to demonstrate lesser fall risk    Baseline  03/25/18 18/30; 04/22/18  20/30    Time  4    Period  Weeks    Status  Partially Met        PT Long Term Goals - 03/25/18 1458      PT LONG TERM GOAL #1   Title  Patient will be independent with HEP (including safe with balance exercises) in preparation for discharge (Target for all LTGs 05/24/2018)    Time  8    Period  Weeks    Status  New    Target Date  05/24/18      PT LONG TERM GOAL #2   Title  Patient will ambulate 1000 feet in combination of simulated home environment (narrow spaces, doorways, etc) and outdoor unlevel, paved surfaces without a device independently    Time  8    Period  Weeks    Status  New      PT LONG TERM GOAL #3   Title  Patient will improve gait velocity to >=2.85 ft/sec (norm for age)    Time  8    Period  Weeks    Status  New      PT LONG TERM GOAL #4   Title  Patient will score >=25/30 on FGA to demonstrate lesser fall risk    Time  8    Period  Weeks    Status  New      PT LONG TERM GOAL #5   Title  Patient will decrease 5x STS to <12 seconds     Baseline  03/25/18 18.4 sec    Time  8    Period  Weeks    Status  New  Plan - 05/06/18 1714    Clinical Impression Statement  Skilled session included updating HEP to further challenge/improve her balance and strength. Patient consistently with difficulty with balance with narrow BOS (partial tandem or tandem) and eyes closed. She can continue to benefit from PT to reduce her risk of falling and injury.     Rehab Potential  Good    PT Frequency  2x / week    PT Duration  8 weeks     PT Treatment/Interventions  ADLs/Self Care Home Management;Canalith Repostioning;DME Instruction;Balance training;Therapeutic exercise;Therapeutic activities;Functional mobility training;Stair training;Gait training;Neuromuscular re-education;Cognitive remediation;Patient/family education;Passive range of motion;Vestibular;Visual/perceptual remediation/compensation    PT Next Visit Plan  establish walking or other aerobic exercise plan; walk outdoors;  balance and multi-tasking with gait:  LE strengthening    PT Home Exercise Plan  [email or web address]; also sit to stand from [facility]    Consulted and Agree with Plan of Care  Patient;Family member/caregiver       Patient will benefit from skilled therapeutic intervention in order to improve the following deficits and impairments:  Abnormal gait, Decreased balance, Decreased coordination, Decreased knowledge of use of DME, Decreased safety awareness, Decreased strength, Dizziness, Impaired perceived functional ability, Postural dysfunction, Other (comment)  Visit Diagnosis: Unsteadiness on feet  Other abnormalities of gait and mobility  Muscle weakness (generalized)     Problem List Patient Active Problem List   Diagnosis Date Noted  . Essential hypertension 03/12/2018  . Cognitive deficit, post-stroke   . Hypertension   . Hypoalbuminemia due to protein-calorie malnutrition ([place])   . Urinary retention   . Seizure prophylaxis   . SAH (subarachnoid hemorrhage) ([place]) 12/26/2017  . Subarachnoid hemorrhage due to ruptured aneurysm ([place]) 11/26/2017    Rexanne Mano, PT 05/06/2018, 5:18 PM  [facility] [address] [place], [place], [address] Phone: [phone]   Fax:  [phone]  Name: Sheryl James MRN: [id number] Date of Birth: 10-Nov-1955

## 2018-05-06 NOTE — Patient Instructions (Signed)
Access Code: [email or web address]  URL: [email or web address]  Date: 05/06/2018  Prepared by: Barry Brunner   Exercises  Seated Gastroc Stretch with Strap - 3 reps - 1 sets - 30 seconds hold - 2x daily - 7x weekly  Standing Hip Abduction with Resistance at Ankles and Counter Support - 10 reps - 1 sets - 3 hold - 2x daily - 5x weekly  Single Leg Heel Raise - 10 reps - 1 sets - 3 hold - 2x daily - 5x weekly  Standing Toe Raises at Chair - 20 reps - 1 sets - 3 seconds hold - 2x daily - 5x weekly  Backward Tandem Walking with Counter Support - 5 reps - 1 sets - - hold - 2x daily - 5x weekly  Heel Walking - 5 reps - 1 sets - - hold - 2x daily - 5x weekly

## 2018-05-10 ENCOUNTER — Inpatient Hospital Stay (HOSPITAL_COMMUNITY): Payer: 59 | Admitting: Anesthesiology

## 2018-05-10 ENCOUNTER — Emergency Department (HOSPITAL_COMMUNITY): Payer: 59

## 2018-05-10 ENCOUNTER — Encounter (HOSPITAL_COMMUNITY)
Admission: EM | Disposition: A | Discharge status: Skilled Nursing Facility | Payer: Self-pay | Source: Home / Self Care | Attending: Internal Medicine

## 2018-05-10 ENCOUNTER — Other Ambulatory Visit: Payer: Self-pay

## 2018-05-10 ENCOUNTER — Inpatient Hospital Stay (HOSPITAL_COMMUNITY)
Admission: EM | Admit: 2018-05-10 | Discharge: 2018-05-12 | DRG: 470 | Disposition: A | Discharge status: Skilled Nursing Facility | Payer: 59 | Attending: Internal Medicine | Admitting: Internal Medicine

## 2018-05-10 ENCOUNTER — Encounter (HOSPITAL_COMMUNITY): Payer: Self-pay

## 2018-05-10 ENCOUNTER — Inpatient Hospital Stay (HOSPITAL_COMMUNITY): Payer: 59

## 2018-05-10 DIAGNOSIS — Y92009 Unspecified place in unspecified non-institutional (private) residence as the place of occurrence of the external cause: Secondary | ICD-10-CM | POA: Diagnosis not present

## 2018-05-10 DIAGNOSIS — M2550 Pain in unspecified joint: Secondary | ICD-10-CM | POA: Diagnosis not present

## 2018-05-10 DIAGNOSIS — W010XXA Fall on same level from slipping, tripping and stumbling without subsequent striking against object, initial encounter: Secondary | ICD-10-CM | POA: Diagnosis present

## 2018-05-10 DIAGNOSIS — S93402A Sprain of unspecified ligament of left ankle, initial encounter: Secondary | ICD-10-CM | POA: Diagnosis not present

## 2018-05-10 DIAGNOSIS — Z8673 Personal history of transient ischemic attack (TIA), and cerebral infarction without residual deficits: Secondary | ICD-10-CM

## 2018-05-10 DIAGNOSIS — Z8781 Personal history of (healed) traumatic fracture: Secondary | ICD-10-CM

## 2018-05-10 DIAGNOSIS — R52 Pain, unspecified: Secondary | ICD-10-CM | POA: Diagnosis not present

## 2018-05-10 DIAGNOSIS — Z79899 Other long term (current) drug therapy: Secondary | ICD-10-CM

## 2018-05-10 DIAGNOSIS — W19XXXD Unspecified fall, subsequent encounter: Secondary | ICD-10-CM | POA: Diagnosis not present

## 2018-05-10 DIAGNOSIS — K219 Gastro-esophageal reflux disease without esophagitis: Secondary | ICD-10-CM | POA: Diagnosis present

## 2018-05-10 DIAGNOSIS — S72002D Fracture of unspecified part of neck of left femur, subsequent encounter for closed fracture with routine healing: Secondary | ICD-10-CM | POA: Diagnosis not present

## 2018-05-10 DIAGNOSIS — S3289XA Fracture of other parts of pelvis, initial encounter for closed fracture: Secondary | ICD-10-CM | POA: Diagnosis not present

## 2018-05-10 DIAGNOSIS — S72002A Fracture of unspecified part of neck of left femur, initial encounter for closed fracture: Secondary | ICD-10-CM | POA: Diagnosis not present

## 2018-05-10 DIAGNOSIS — Z471 Aftercare following joint replacement surgery: Secondary | ICD-10-CM | POA: Diagnosis not present

## 2018-05-10 DIAGNOSIS — R339 Retention of urine, unspecified: Secondary | ICD-10-CM | POA: Diagnosis not present

## 2018-05-10 DIAGNOSIS — F17210 Nicotine dependence, cigarettes, uncomplicated: Secondary | ICD-10-CM | POA: Diagnosis not present

## 2018-05-10 DIAGNOSIS — R9431 Abnormal electrocardiogram [ECG] [EKG]: Secondary | ICD-10-CM | POA: Diagnosis not present

## 2018-05-10 DIAGNOSIS — S93401A Sprain of unspecified ligament of right ankle, initial encounter: Secondary | ICD-10-CM

## 2018-05-10 DIAGNOSIS — M25571 Pain in right ankle and joints of right foot: Secondary | ICD-10-CM | POA: Diagnosis not present

## 2018-05-10 DIAGNOSIS — Z9889 Other specified postprocedural states: Secondary | ICD-10-CM

## 2018-05-10 DIAGNOSIS — Z7401 Bed confinement status: Secondary | ICD-10-CM | POA: Diagnosis not present

## 2018-05-10 DIAGNOSIS — I10 Essential (primary) hypertension: Secondary | ICD-10-CM | POA: Diagnosis present

## 2018-05-10 DIAGNOSIS — Z96642 Presence of left artificial hip joint: Secondary | ICD-10-CM | POA: Diagnosis not present

## 2018-05-10 HISTORY — PX: HIP ARTHROPLASTY: SHX981

## 2018-05-10 LAB — CBC WITH DIFFERENTIAL/PLATELET
Abs Immature Granulocytes: 0.07 10*3/uL (ref 0.00–0.07)
BASOS PCT: 0 %
Basophils Absolute: 0 10*3/uL (ref 0.0–0.1)
Eosinophils Absolute: 0 10*3/uL (ref 0.0–0.5)
Eosinophils Relative: 0 %
HCT: 40.5 % (ref 36.0–46.0)
Hemoglobin: 13.4 g/dL (ref 12.0–15.0)
IMMATURE GRANULOCYTES: 1 %
LYMPHS ABS: 0.8 10*3/uL (ref 0.7–4.0)
Lymphocytes Relative: 6 %
MCH: 30 pg (ref 26.0–34.0)
MCHC: 33.1 g/dL (ref 30.0–36.0)
MCV: 90.6 fL (ref 80.0–100.0)
MONOS PCT: 6 %
Monocytes Absolute: 0.8 10*3/uL (ref 0.1–1.0)
Neutro Abs: 12.8 10*3/uL — ABNORMAL HIGH (ref 1.7–7.7)
Neutrophils Relative %: 87 %
Platelets: 274 10*3/uL (ref 150–400)
RBC: 4.47 MIL/uL (ref 3.87–5.11)
RDW: 13.3 % (ref 11.5–15.5)
WBC: 14.6 10*3/uL — ABNORMAL HIGH (ref 4.0–10.5)
nRBC: 0 % (ref 0.0–0.2)

## 2018-05-10 LAB — BASIC METABOLIC PANEL
Anion gap: 9 (ref 5–15)
BUN: 16 mg/dL (ref 8–23)
CALCIUM: 8.1 mg/dL — AB (ref 8.9–10.3)
CO2: 21 mmol/L — AB (ref 22–32)
Chloride: 107 mmol/L (ref 98–111)
Creatinine, Ser: 0.56 mg/dL (ref 0.44–1.00)
GFR calc Af Amer: >60 mL/min (ref >60)
GLUCOSE: 117 mg/dL — AB (ref 70–99)
Potassium: 3.3 mmol/L — ABNORMAL LOW (ref 3.5–5.1)
Sodium: 137 mmol/L (ref 135–145)

## 2018-05-10 LAB — PROTIME-INR
INR: 0.9 (ref 0.8–1.2)
Prothrombin Time: 12.3 seconds (ref 11.4–15.2)

## 2018-05-10 LAB — TYPE AND SCREEN
ABO/RH(D): A NEG
Antibody Screen: NEGATIVE

## 2018-05-10 LAB — ABO/RH: ABO/RH(D): A NEG

## 2018-05-10 SURGERY — HEMIARTHROPLASTY, HIP, DIRECT ANTERIOR APPROACH, FOR FRACTURE
Anesthesia: General | Site: Hip | Laterality: Left

## 2018-05-10 MED ORDER — FENTANYL CITRATE (PF) 100 MCG/2ML IJ SOLN
INTRAMUSCULAR | Status: AC
  Filled 2018-05-10: qty 2

## 2018-05-10 MED ORDER — DOCUSATE SODIUM 100 MG PO CAPS
100.0000 mg | ORAL_CAPSULE | Freq: Two times a day (BID) | ORAL | Status: DC
  Administered 2018-05-11 – 2018-05-12 (×3): 100 mg via ORAL
  Filled 2018-05-10 (×4): qty 1

## 2018-05-10 MED ORDER — PHENYLEPHRINE 40 MCG/ML (10ML) SYRINGE FOR IV PUSH (FOR BLOOD PRESSURE SUPPORT)
PREFILLED_SYRINGE | INTRAVENOUS | Status: DC | PRN
  Administered 2018-05-10: 160 ug via INTRAVENOUS
  Administered 2018-05-10: 120 ug via INTRAVENOUS
  Administered 2018-05-10: 80 ug via INTRAVENOUS
  Administered 2018-05-10: 120 ug via INTRAVENOUS

## 2018-05-10 MED ORDER — FERROUS SULFATE 325 (65 FE) MG PO TABS
325.0000 mg | ORAL_TABLET | Freq: Three times a day (TID) | ORAL | Status: DC
  Administered 2018-05-11 – 2018-05-12 (×5): 325 mg via ORAL
  Filled 2018-05-10 (×5): qty 1

## 2018-05-10 MED ORDER — POLYETHYLENE GLYCOL 3350 17 G PO PACK: 17.0000 g | PACK | Freq: Every day | ORAL | Status: DC | PRN

## 2018-05-10 MED ORDER — LISINOPRIL 10 MG PO TABS
10.0000 mg | ORAL_TABLET | Freq: Every day | ORAL | Status: DC
  Administered 2018-05-11 – 2018-05-12 (×2): 10 mg via ORAL
  Filled 2018-05-10 (×2): qty 1

## 2018-05-10 MED ORDER — PROPOFOL 10 MG/ML IV BOLUS
INTRAVENOUS | Status: DC | PRN
  Administered 2018-05-10: 160 mg via INTRAVENOUS

## 2018-05-10 MED ORDER — PANTOPRAZOLE SODIUM 40 MG PO TBEC
40.0000 mg | DELAYED_RELEASE_TABLET | Freq: Every day | ORAL | Status: DC
  Administered 2018-05-11 – 2018-05-12 (×2): 40 mg via ORAL
  Filled 2018-05-10 (×2): qty 1

## 2018-05-10 MED ORDER — METOCLOPRAMIDE HCL 5 MG PO TABS: 5.0000 mg | ORAL_TABLET | Freq: Three times a day (TID) | ORAL | Status: DC | PRN

## 2018-05-10 MED ORDER — KETOROLAC TROMETHAMINE 15 MG/ML IJ SOLN
7.5000 mg | Freq: Four times a day (QID) | INTRAMUSCULAR | Status: AC
  Administered 2018-05-10 – 2018-05-11 (×4): 7.5 mg via INTRAVENOUS
  Filled 2018-05-10 (×4): qty 1

## 2018-05-10 MED ORDER — MENTHOL 3 MG MT LOZG: 1.0000 | LOZENGE | OROMUCOSAL | Status: DC | PRN

## 2018-05-10 MED ORDER — ACETAMINOPHEN 500 MG PO TABS
500.0000 mg | ORAL_TABLET | Freq: Four times a day (QID) | ORAL | Status: AC
  Administered 2018-05-10 – 2018-05-11 (×4): 500 mg via ORAL
  Filled 2018-05-10 (×4): qty 1

## 2018-05-10 MED ORDER — PHENOL 1.4 % MT LIQD: 1.0000 | OROMUCOSAL | Status: DC | PRN

## 2018-05-10 MED ORDER — MAGNESIUM CITRATE PO SOLN: 1.0000 | Freq: Once | ORAL | Status: DC | PRN

## 2018-05-10 MED ORDER — SODIUM CHLORIDE 0.9 % IV SOLN
Freq: Once | INTRAVENOUS | Status: AC
  Administered 2018-05-10: 15:00:00 via INTRAVENOUS

## 2018-05-10 MED ORDER — ENOXAPARIN SODIUM 40 MG/0.4ML ~~LOC~~ SOLN
40.0000 mg | SUBCUTANEOUS | Status: DC
  Administered 2018-05-11 – 2018-05-12 (×2): 40 mg via SUBCUTANEOUS
  Filled 2018-05-10 (×2): qty 0.4

## 2018-05-10 MED ORDER — METOCLOPRAMIDE HCL 5 MG/ML IJ SOLN: 5.0000 mg | Freq: Three times a day (TID) | INTRAMUSCULAR | Status: DC | PRN

## 2018-05-10 MED ORDER — CEFAZOLIN SODIUM-DEXTROSE 2-4 GM/100ML-% IV SOLN
2.0000 g | Freq: Four times a day (QID) | INTRAVENOUS | Status: AC
  Administered 2018-05-10 – 2018-05-11 (×2): 2 g via INTRAVENOUS
  Filled 2018-05-10 (×2): qty 100

## 2018-05-10 MED ORDER — CEFAZOLIN SODIUM-DEXTROSE 2-4 GM/100ML-% IV SOLN
INTRAVENOUS | Status: AC
  Filled 2018-05-10: qty 100

## 2018-05-10 MED ORDER — CHLORHEXIDINE GLUCONATE 4 % EX LIQD: 60.0000 mL | Freq: Once | CUTANEOUS | Status: DC

## 2018-05-10 MED ORDER — LIDOCAINE 2% (20 MG/ML) 5 ML SYRINGE
INTRAMUSCULAR | Status: DC | PRN
  Administered 2018-05-10: 60 mg via INTRAVENOUS

## 2018-05-10 MED ORDER — SACCHAROMYCES BOULARDII 250 MG PO CAPS
250.0000 mg | ORAL_CAPSULE | Freq: Two times a day (BID) | ORAL | Status: DC
  Administered 2018-05-10 – 2018-05-12 (×4): 250 mg via ORAL
  Filled 2018-05-10 (×4): qty 1

## 2018-05-10 MED ORDER — ACETAMINOPHEN 325 MG PO TABS: 325.0000 mg | ORAL_TABLET | Freq: Four times a day (QID) | ORAL | Status: DC | PRN

## 2018-05-10 MED ORDER — LACTATED RINGERS IV SOLN
INTRAVENOUS | Status: DC | PRN
  Administered 2018-05-10 (×2): via INTRAVENOUS

## 2018-05-10 MED ORDER — ENOXAPARIN SODIUM 40 MG/0.4ML ~~LOC~~ SOLN: 40.0000 mg | SUBCUTANEOUS | 0 refills | Status: DC

## 2018-05-10 MED ORDER — ONDANSETRON HCL 4 MG/2ML IJ SOLN: 4.0000 mg | Freq: Four times a day (QID) | INTRAMUSCULAR | Status: DC | PRN

## 2018-05-10 MED ORDER — PHENYLEPHRINE 40 MCG/ML (10ML) SYRINGE FOR IV PUSH (FOR BLOOD PRESSURE SUPPORT)
PREFILLED_SYRINGE | INTRAVENOUS | Status: AC
  Filled 2018-05-10: qty 10

## 2018-05-10 MED ORDER — PROPOFOL 10 MG/ML IV BOLUS
INTRAVENOUS | Status: AC
  Filled 2018-05-10: qty 20

## 2018-05-10 MED ORDER — FENTANYL CITRATE (PF) 250 MCG/5ML IJ SOLN
INTRAMUSCULAR | Status: AC
  Filled 2018-05-10: qty 5

## 2018-05-10 MED ORDER — FENTANYL CITRATE (PF) 100 MCG/2ML IJ SOLN: 25.0000 ug | INTRAMUSCULAR | Status: DC | PRN

## 2018-05-10 MED ORDER — CEFAZOLIN SODIUM-DEXTROSE 2-4 GM/100ML-% IV SOLN
2.0000 g | INTRAVENOUS | Status: AC
  Administered 2018-05-10: 2 g via INTRAVENOUS

## 2018-05-10 MED ORDER — BETHANECHOL CHLORIDE 10 MG PO TABS
10.0000 mg | ORAL_TABLET | Freq: Three times a day (TID) | ORAL | Status: DC
  Administered 2018-05-10 – 2018-05-12 (×5): 10 mg via ORAL
  Filled 2018-05-10 (×6): qty 1

## 2018-05-10 MED ORDER — HYDROCODONE-ACETAMINOPHEN 7.5-325 MG PO TABS: 1.0000 | ORAL_TABLET | ORAL | Status: DC | PRN

## 2018-05-10 MED ORDER — ONDANSETRON HCL 4 MG PO TABS: 4.0000 mg | ORAL_TABLET | Freq: Four times a day (QID) | ORAL | Status: DC | PRN

## 2018-05-10 MED ORDER — POTASSIUM CHLORIDE 10 MEQ/100ML IV SOLN
10.0000 meq | Freq: Once | INTRAVENOUS | Status: AC
  Administered 2018-05-10: 10 meq via INTRAVENOUS
  Filled 2018-05-10: qty 100

## 2018-05-10 MED ORDER — MORPHINE SULFATE (PF) 2 MG/ML IV SOLN: 0.5000 mg | INTRAVENOUS | Status: DC | PRN

## 2018-05-10 MED ORDER — MIDAZOLAM HCL 2 MG/2ML IJ SOLN
INTRAMUSCULAR | Status: AC
  Filled 2018-05-10: qty 2

## 2018-05-10 MED ORDER — FENTANYL CITRATE (PF) 100 MCG/2ML IJ SOLN
INTRAMUSCULAR | Status: DC | PRN
  Administered 2018-05-10: 100 ug via INTRAVENOUS
  Administered 2018-05-10 (×3): 50 ug via INTRAVENOUS
  Administered 2018-05-10 (×2): 100 ug via INTRAVENOUS

## 2018-05-10 MED ORDER — MIDAZOLAM HCL 5 MG/5ML IJ SOLN
INTRAMUSCULAR | Status: DC | PRN
  Administered 2018-05-10: 2 mg via INTRAVENOUS

## 2018-05-10 MED ORDER — SUGAMMADEX SODIUM 200 MG/2ML IV SOLN
INTRAVENOUS | Status: DC | PRN
  Administered 2018-05-10: 200 mg via INTRAVENOUS

## 2018-05-10 MED ORDER — POVIDONE-IODINE 10 % EX SWAB: 2.0000 "application " | Freq: Once | CUTANEOUS | Status: DC

## 2018-05-10 MED ORDER — ALUM & MAG HYDROXIDE-SIMETH 200-200-20 MG/5ML PO SUSP: 30.0000 mL | ORAL | Status: DC | PRN

## 2018-05-10 MED ORDER — FENTANYL CITRATE (PF) 100 MCG/2ML IJ SOLN
50.0000 ug | INTRAMUSCULAR | Status: DC | PRN
  Administered 2018-05-10: 50 ug via INTRAVENOUS
  Filled 2018-05-10: qty 2

## 2018-05-10 MED ORDER — TRANEXAMIC ACID-NACL 1000-0.7 MG/100ML-% IV SOLN
1000.0000 mg | INTRAVENOUS | Status: AC
  Administered 2018-05-10: 1000 mg via INTRAVENOUS
  Filled 2018-05-10: qty 100

## 2018-05-10 MED ORDER — ACETAMINOPHEN 500 MG PO TABS: 1000.0000 mg | ORAL_TABLET | Freq: Once | ORAL | Status: DC

## 2018-05-10 MED ORDER — OXYCODONE HCL 5 MG PO TABS: 5.0000 mg | ORAL_TABLET | Freq: Once | ORAL | Status: DC | PRN

## 2018-05-10 MED ORDER — 0.9 % SODIUM CHLORIDE (POUR BTL) OPTIME
TOPICAL | Status: DC | PRN
  Administered 2018-05-10: 1000 mL

## 2018-05-10 MED ORDER — MAGNESIUM SULFATE 2 GM/50ML IV SOLN
2.0000 g | Freq: Once | INTRAVENOUS | Status: AC
  Administered 2018-05-10: 2 g via INTRAVENOUS
  Filled 2018-05-10: qty 50

## 2018-05-10 MED ORDER — HYDROCODONE-ACETAMINOPHEN 5-325 MG PO TABS
1.0000 | ORAL_TABLET | ORAL | Status: DC | PRN
  Administered 2018-05-11 – 2018-05-12 (×3): 2 via ORAL
  Filled 2018-05-10: qty 2
  Filled 2018-05-10 (×3): qty 1
  Filled 2018-05-10: qty 2

## 2018-05-10 MED ORDER — TAMSULOSIN HCL 0.4 MG PO CAPS
0.8000 mg | ORAL_CAPSULE | Freq: Every day | ORAL | Status: DC
  Administered 2018-05-11 – 2018-05-12 (×2): 0.8 mg via ORAL
  Filled 2018-05-10 (×2): qty 2

## 2018-05-10 MED ORDER — BISACODYL 10 MG RE SUPP: 10.0000 mg | Freq: Every day | RECTAL | Status: DC | PRN

## 2018-05-10 MED ORDER — SODIUM CHLORIDE 0.9 % IV SOLN
INTRAVENOUS | Status: DC | PRN
  Administered 2018-05-10: 16:00:00 via INTRAVENOUS

## 2018-05-10 MED ORDER — POTASSIUM CHLORIDE 10 MEQ/100ML IV SOLN
10.0000 meq | INTRAVENOUS | Status: AC
  Administered 2018-05-10 – 2018-05-11 (×3): 10 meq via INTRAVENOUS
  Filled 2018-05-10 (×3): qty 100

## 2018-05-10 MED ORDER — POTASSIUM CHLORIDE IN NACL 20-0.9 MEQ/L-% IV SOLN
INTRAVENOUS | Status: DC
  Administered 2018-05-10 – 2018-05-11 (×2): via INTRAVENOUS
  Filled 2018-05-10 (×4): qty 1000

## 2018-05-10 MED ORDER — OXYCODONE HCL 5 MG/5ML PO SOLN: 5.0000 mg | Freq: Once | ORAL | Status: DC | PRN

## 2018-05-10 MED ORDER — ROCURONIUM BROMIDE 10 MG/ML (PF) SYRINGE
PREFILLED_SYRINGE | INTRAVENOUS | Status: DC | PRN
  Administered 2018-05-10: 50 mg via INTRAVENOUS
  Administered 2018-05-10 (×2): 10 mg via INTRAVENOUS

## 2018-05-10 MED ORDER — HYDROCODONE-ACETAMINOPHEN 5-325 MG PO TABS: 1.0000 | ORAL_TABLET | Freq: Four times a day (QID) | ORAL | 0 refills | Status: DC | PRN

## 2018-05-10 MED ORDER — LACTATED RINGERS IV SOLN: INTRAVENOUS | Status: DC

## 2018-05-10 SURGICAL SUPPLY — 47 items
BIT DRILL 2.0X128 (BIT) ×2 IMPLANT
BLADE SAW SGTL 73X25 THK (BLADE) ×2 IMPLANT
BRUSH FEMORAL CANAL (MISCELLANEOUS) IMPLANT
CHLORAPREP W/TINT 26 (MISCELLANEOUS) ×2 IMPLANT
COVER SURGICAL LIGHT HANDLE (MISCELLANEOUS) ×2 IMPLANT
COVER WAND RF STERILE (DRAPES) IMPLANT
DRAPE INCISE IOBAN 66X45 STRL (DRAPES) ×2 IMPLANT
DRAPE ORTHO SPLIT 77X108 STRL (DRAPES) ×2
DRAPE SURG ORHT 6 SPLT 77X108 (DRAPES) ×2 IMPLANT
DRAPE U-SHAPE 47X51 STRL (DRAPES) ×2 IMPLANT
DRSG MEPILEX BORDER 4X8 (GAUZE/BANDAGES/DRESSINGS) ×2 IMPLANT
ELECT REM PT RETURN 15FT ADLT (MISCELLANEOUS) ×2 IMPLANT
GLOVE BIOGEL PI IND STRL 8 (GLOVE) ×1 IMPLANT
GLOVE BIOGEL PI INDICATOR 8 (GLOVE) ×1
GLOVE ECLIPSE 7.5 STRL STRAW (GLOVE) ×2 IMPLANT
GLOVE ORTHO TXT STRL SZ7.5 (GLOVE) ×2 IMPLANT
GOWN STRL REUS W/TWL LRG LVL3 (GOWN DISPOSABLE) ×2 IMPLANT
HANDPIECE INTERPULSE COAX TIP (DISPOSABLE) ×1
HEAD FEM UNIPOLAR 46 OD STRL (Hips) ×2 IMPLANT
KIT BASIN OR (CUSTOM PROCEDURE TRAY) ×2 IMPLANT
KIT TURNOVER KIT A (KITS) IMPLANT
NEEDLE MA TROC 1/2 (NEEDLE) ×2 IMPLANT
NS IRRIG 1000ML POUR BTL (IV SOLUTION) ×2 IMPLANT
PACK TOTAL JOINT (CUSTOM PROCEDURE TRAY) ×2 IMPLANT
PACK TOTAL KNEE CUSTOM (KITS) IMPLANT
PASSER SUT SWANSON 36MM LOOP (INSTRUMENTS) ×2 IMPLANT
PRESSURIZER FEMORAL UNIV (MISCELLANEOUS) IMPLANT
PROTECTOR NERVE ULNAR (MISCELLANEOUS) ×2 IMPLANT
SET HNDPC FAN SPRY TIP SCT (DISPOSABLE) ×1 IMPLANT
SPACER DEPUY (Hips) ×2 IMPLANT
SPONGE LAP 4X18 RFD (DISPOSABLE) ×2 IMPLANT
STEM SUMMIT BASIC PRESSFIT SZ5 (Hips) ×2 IMPLANT
STRIP CLOSURE SKIN 1/4X4 (GAUZE/BANDAGES/DRESSINGS) ×4 IMPLANT
SUT ETHIBOND NAB CT1 #1 30IN (SUTURE) ×4 IMPLANT
SUT FIBERWIRE #2 38 T-5 BLUE (SUTURE)
SUT MNCRL AB 4-0 PS2 18 (SUTURE) ×2 IMPLANT
SUT VIC AB 1 CT1 27 (SUTURE)
SUT VIC AB 1 CT1 27XBRD ANTBC (SUTURE) IMPLANT
SUT VIC AB 2-0 CT1 27 (SUTURE) ×2
SUT VIC AB 2-0 CT1 TAPERPNT 27 (SUTURE) ×2 IMPLANT
SUT VIC AB 3-0 SH 8-18 (SUTURE) ×2 IMPLANT
SUTURE FIBERWR #2 38 T-5 BLUE (SUTURE) IMPLANT
TAPE STRIPS DRAPE STRL (GAUZE/BANDAGES/DRESSINGS) ×2 IMPLANT
TOWEL OR 17X26 10 PK STRL BLUE (TOWEL DISPOSABLE) ×6 IMPLANT
TOWER CARTRIDGE SMART MIX (DISPOSABLE) IMPLANT
TRAY FOLEY MTR SLVR 16FR STAT (SET/KITS/TRAYS/PACK) IMPLANT
WATER STERILE IRR 1000ML POUR (IV SOLUTION) ×2 IMPLANT

## 2018-05-10 NOTE — H&P (Signed)
History and Physical  Sheryl James NWG:[id number] DOB: 03-Nov-1955 DOA: 05/10/2018  Referring physician: Dr Melina Copa PCP: Patient, No Pcp Per  Outpatient Specialists: None Patient coming from: Home  Chief Complaint: Left hip pain   HPI: Sheryl James is a 63 y.o. female with medical history significant for hypertension, subarachnoid hemorrhage, urinary retention, and GERD who presented from home to [facility] ED with complaints of severe left hip pain post mechanical fall earlier this afternoon.  States she was sitting in a chair with her legs crossed, her left leg fell asleep and when she stood up she fell.  Was in her normal state of health prior to this.  Denies any dizziness, chest pain, palpitations, or history of falls.  No recent changes.  ED Course: Upon presentation to the ED, vital signs remarkable for elevated blood pressure possibly exacerbated by pain.  Afebrile.  Lab studies remarkable for leukocytosis with WBC 14 K.  Left hip x-ray positive for fracture.  TRH asked to admit.  Review of Systems: Review of systems as noted in the HPI. All other systems reviewed and are negative.   History reviewed. No pertinent past medical history. Past Surgical History:  Procedure Laterality Date  . IR ANGIO INTRA EXTRACRAN SEL INTERNAL CAROTID BILAT MOD SED  11/27/2017  . IR ANGIO VERTEBRAL SEL VERTEBRAL UNI L MOD SED  11/27/2017  . IR ANGIOGRAM FOLLOW UP STUDY  11/27/2017  . IR ANGIOGRAM FOLLOW UP STUDY  11/27/2017  . IR ANGIOGRAM FOLLOW UP STUDY  11/27/2017  . IR ANGIOGRAM FOLLOW UP STUDY  11/27/2017  . IR TRANSCATH/EMBOLIZ  11/27/2017  . IR US GUIDE VASC ACCESS RIGHT  11/27/2017  . RADIOLOGY WITH ANESTHESIA N/A 11/27/2017   Procedure: IR WITH ANESTHESIA;  Surgeon: Consuella Lose, MD;  Location: [place];  Service: Radiology;  Laterality: N/A;    Social History:  reports that she has been smoking cigarettes. She has been smoking about 0.50 packs per day. She has never used smokeless tobacco. She  reports that she does not drink alcohol or use drugs.   No Known Allergies  History reviewed. No pertinent family history.  Self-reported no family history of heart disease.  Prior to Admission medications   Medication Sig Start Date End Date Taking? Authorizing Provider  lisinopril (PRINIVIL,ZESTRIL) 10 MG tablet Take 10 mg by mouth daily.   Yes [provider]  acetaminophen (TYLENOL) 325 MG tablet Take 2 tablets (650 mg total) by mouth every 4 (four) hours as needed for mild pain (or temp > 37.5 C (99.5 F)). Patient not taking: Reported on 03/25/2018 01/08/18   Angiulli, Lavon Paganini, PA-C  bethanechol (URECHOLINE) 10 MG tablet Take 1 tablet (10 mg total) by mouth 3 (three) times daily. Patient not taking: Reported on 03/25/2018 01/08/18   Angiulli, Lavon Paganini, PA-C  pantoprazole (PROTONIX) 40 MG tablet Take 1 tablet (40 mg total) by mouth daily. Patient not taking: Reported on 03/25/2018 01/08/18   Angiulli, Lavon Paganini, PA-C  potassium chloride (K-DUR) 10 MEQ tablet Take 1 tablet (10 mEq total) by mouth daily for 5 days. Patient not taking: Reported on 01/21/2018 01/16/18 01/21/18  Caccavale, Sophia, PA-C  saccharomyces boulardii (FLORASTOR) 250 MG capsule Take 1 capsule (250 mg total) by mouth 2 (two) times daily. Patient not taking: Reported on 03/25/2018 01/08/18   Angiulli, Lavon Paganini, PA-C  tamsulosin (FLOMAX) 0.4 MG CAPS capsule Take 2 capsules (0.8 mg total) by mouth daily. Patient not taking: Reported on 03/25/2018 01/08/18   Lauraine Rinne  J, PA-C    Physical Exam: BP 135/71   Pulse 86   Temp 98.1 F (36.7 C) (Oral)   Resp 13   Ht 5' (1.524 m)   Wt 68 kg   SpO2 98%   BMI 29.29 kg/m   . General: 63 y.o. year-old female well developed well nourished in no acute distress.  Alert and oriented x3. . Cardiovascular: Regular rate and rhythm with no rubs or gallops.  No thyromegaly or JVD noted.  No lower extremity edema. 2/4 pulses in all 4 extremities. Marland Kitchen Respiratory: Clear  to auscultation with no wheezes or rales. Good inspiratory effort. . Abdomen: Soft nontender nondistended with normal bowel sounds x4 quadrants. . Muskuloskeletal: No cyanosis, clubbing or edema noted bilaterally.  Left lower extremity externally rotated with shorter length. . Neuro: CN II-XII intact, strength, sensation, reflexes . Skin: No ulcerative lesions noted or rashes . Psychiatry: Judgement and insight appear normal. Mood is appropriate for condition and setting          Labs on Admission:  Basic Metabolic Panel: Recent Labs  Lab 05/10/18 1259  NA 137  K 3.3*  CL 107  CO2 21*  GLUCOSE 117*  BUN 16  CREATININE 0.56  CALCIUM 8.1*   Liver Function Tests: No results for input(s): AST, ALT, ALKPHOS, BILITOT, PROT, ALBUMIN in the last 168 hours. No results for input(s): LIPASE, AMYLASE in the last 168 hours. No results for input(s): AMMONIA in the last 168 hours. CBC: Recent Labs  Lab 05/10/18 1259  WBC 14.6*  NEUTROABS 12.8*  HGB 13.4  HCT 40.5  MCV 90.6  PLT 274   Cardiac Enzymes: No results for input(s): CKTOTAL, CKMB, CKMBINDEX, TROPONINI in the last 168 hours.  BNP (last 3 results) No results for input(s): BNP in the last 8760 hours.  ProBNP (last 3 results) No results for input(s): PROBNP in the last 8760 hours.  CBG: No results for input(s): GLUCAP in the last 168 hours.  Radiological Exams on Admission: Dg Chest 1 View  Result Date: 05/10/2018 CLINICAL DATA:  Fall and LEFT hip fracture. EXAM: CHEST  1 VIEW COMPARISON:  12/06/2017 and prior radiographs FINDINGS: The cardiomediastinal silhouette is unremarkable. Mild chronic peribronchial thickening again noted. There is no evidence of focal airspace disease, pulmonary edema, suspicious pulmonary nodule/mass, pleural effusion, or pneumothorax. No acute bony abnormalities are identified. IMPRESSION: No acute abnormality. Electronically Signed   By: Margarette Canada M.D.   On: 05/10/2018 14:04   Dg Ankle  Complete Right  Result Date: 05/10/2018 CLINICAL DATA:  Acute RIGHT ankle pain following fall. Initial encounter. EXAM: RIGHT ANKLE - COMPLETE 3+ VIEW COMPARISON:  None. FINDINGS: No acute fracture, subluxation or dislocation. LATERAL soft tissue swelling is present. The joint space is unremarkable. No focal bony lesions are present. IMPRESSION: Soft tissue swelling without acute bony abnormality. Electronically Signed   By: Margarette Canada M.D.   On: 05/10/2018 14:06   Dg Hip Unilat With Pelvis 2-3 Views Left  Result Date: 05/10/2018 CLINICAL DATA:  Acute LEFT hip pain following fall. Initial encounter. EXAM: DG HIP (WITH OR WITHOUT PELVIS) 2-3V LEFT COMPARISON:  None. FINDINGS: A LEFT femoral neck fracture is identified with varus angulation. No dislocation. No definite focal bony lesions are present. IMPRESSION: LEFT femoral neck fracture with varus angulation. Electronically Signed   By: Margarette Canada M.D.   On: 05/10/2018 14:03    EKG: I independently viewed the EKG done and my findings are as followed: Sinus rhythm with rate  of 84 with nonspecific ST-T changes.  QTC prolongation 606.  Assessment/Plan Present on Admission: . Closed left hip fracture (HCC)  Active Problems:   Closed left hip fracture (HCC)  Left hip fracture post mechanical fall Left hip x-ray revealed left femoral neck fracture with varus angulation Last oral intake around 7 AM Orthopedic surgery Dr. Mardelle Matte has been consulted by ED physician and will take to the OR today Continue close monitoring of vital signs Start remote telemetry  QTC prolongation Twelve-lead EKG revealed QTC 606 Avoid QTC prolonging agents Repeat twelve-lead EKG tonight post surgery and tomorrow morning  Start remote telemetry  Leukocytosis Possibly reactive No urine analysis at time of this presentation Denies any urinary symptoms Chest x-ray unremarkable for any acute findings Afebrile Repeat CBC in the morning  Hypertension Uncontrolled  possibly exacerbated by pain Control pain Pain management in place N.p.o. due to planned surgery IV hydralazine for uncontrolled hypertension Resume home medications when no longer n.p.o.  GERD Stable Resume home medication when no longer n.p.o.  History of subarachnoid hemorrhage Appears stable  Urinary retention Resume tamsulosin and bethanechol when no longer n.p.o. Monitor urine output  Risks: High risk for decompensation due to acute left hip fracture requiring surgical intervention, prolonged QTC, multiple comorbidities and advanced age.  Patient will require at least 2 midnights for further evaluation and treatment of present condition.     DVT prophylaxis: SCDs due to planned surgery; after surgery, chemical DVT prophylaxis as recommended by orthopedic surgery.  Code Status: Full code as confirmed by patient herself  Family Communication: Granddaughter at bedside.  Disposition Plan: Admit to [place] with remote telemetry  Consults called: Orthopedic surgery consulted by ED physician  Admission status: Inpatient status    Kayleen Memos MD [facility] Pager [phone]  If 7PM-7AM, please contact night-coverage [email or web address] Password [facility]  05/10/2018, 3:41 PM

## 2018-05-10 NOTE — Progress Notes (Signed)
Left hip fracture.  Npo since 7a.  Plan hemiarthroplasty later today.  Full consult to follow.   Johnny Bridge, MD

## 2018-05-10 NOTE — ED Notes (Signed)
Bed: MV67 Expected date:  Expected time:  Means of arrival:  Comments: Hip and ankle deformity post fall

## 2018-05-10 NOTE — ED Notes (Signed)
ED TO INPATIENT HANDOFF REPORT  ED Nurse Name and Phone #: Tana Coast Name/Age/Gender Sheryl James 63 y.o. female Room/Bed: WA11/WA11  Code Status   Code Status: Prior  Home/SNF/Other Home Patient oriented to: self, place, time and situation Is this baseline? Yes   Triage Complete: Triage complete  Chief Complaint ankle and hip pain  Triage Note EMS reports from home, Pt states while moving from sitting to standing heard pop in left hip then fell onto back and also twisted right ankle. No LOC, no blood thinners. Obvious shortening and rotation to left leg and obvious deformity and swelling to right ankle.  BP 230/100 HR 80 RR 20 Sp02 90 RA (100 4ltrs O2)  18 Left AC 142mcg Fentanyl enroute   Allergies No Known Allergies  Level of Care/Admitting Diagnosis ED Disposition    None      B Medical/Surgery History History reviewed. No pertinent past medical history. Past Surgical History:  Procedure Laterality Date  . IR ANGIO INTRA EXTRACRAN SEL INTERNAL CAROTID BILAT MOD SED  11/27/2017  . IR ANGIO VERTEBRAL SEL VERTEBRAL UNI L MOD SED  11/27/2017  . IR ANGIOGRAM FOLLOW UP STUDY  11/27/2017  . IR ANGIOGRAM FOLLOW UP STUDY  11/27/2017  . IR ANGIOGRAM FOLLOW UP STUDY  11/27/2017  . IR ANGIOGRAM FOLLOW UP STUDY  11/27/2017  . IR TRANSCATH/EMBOLIZ  11/27/2017  . IR US GUIDE VASC ACCESS RIGHT  11/27/2017  . RADIOLOGY WITH ANESTHESIA N/A 11/27/2017   Procedure: IR WITH ANESTHESIA;  Surgeon: Consuella Lose, MD;  Location: [place];  Service: Radiology;  Laterality: N/A;     A IV Location/Drains/Wounds Patient Lines/Drains/Airways Status   Active Line/Drains/Airways    Name:   Placement date:   Placement time:   Site:   Days:   Peripheral IV 05/10/18 Left Antecubital   05/10/18    1238    Antecubital   less than 1   Incision (Closed) 11/26/17 Head   11/26/17    -     165          Intake/Output Last 24 hours No intake or output data in the 24 hours ending 05/10/18  1530  Labs/Imaging Results for orders placed or performed during the hospital encounter of 05/10/18 (from the past 48 hour(s))  Basic metabolic panel     Status: Abnormal   Collection Time: 05/10/18 12:59 PM  Result Value Ref Range   Sodium 137 135 - 145 mmol/L   Potassium 3.3 (L) 3.5 - 5.1 mmol/L   Chloride 107 98 - 111 mmol/L   CO2 21 (L) 22 - 32 mmol/L   Glucose, Bld 117 (H) 70 - 99 mg/dL   BUN 16 8 - 23 mg/dL   Creatinine, Ser 0.56 0.44 - 1.00 mg/dL   Calcium 8.1 (L) 8.9 - 10.3 mg/dL   GFR calc non Af Amer >60 >60 mL/min   GFR calc Af Amer >60 >60 mL/min   Anion gap 9 5 - 15    Comment: Performed at [facility], [place] [address], [place], [place] [address]  CBC WITH DIFFERENTIAL     Status: Abnormal   Collection Time: 05/10/18 12:59 PM  Result Value Ref Range   WBC 14.6 (H) 4.0 - 10.5 K/uL   RBC 4.47 3.87 - 5.11 MIL/uL   Hemoglobin 13.4 12.0 - 15.0 g/dL   HCT 40.5 36.0 - 46.0 %   MCV 90.6 80.0 - 100.0 fL   MCH 30.0 26.0 - 34.0 pg  MCHC 33.1 30.0 - 36.0 g/dL   RDW 13.3 11.5 - 15.5 %   Platelets 274 150 - 400 K/uL   nRBC 0.0 0.0 - 0.2 %   Neutrophils Relative % 87 %   Neutro Abs 12.8 (H) 1.7 - 7.7 K/uL   Lymphocytes Relative 6 %   Lymphs Abs 0.8 0.7 - 4.0 K/uL   Monocytes Relative 6 %   Monocytes Absolute 0.8 0.1 - 1.0 K/uL   Eosinophils Relative 0 %   Eosinophils Absolute 0.0 0.0 - 0.5 K/uL   Basophils Relative 0 %   Basophils Absolute 0.0 0.0 - 0.1 K/uL   Immature Granulocytes 1 %   Abs Immature Granulocytes 0.07 0.00 - 0.07 K/uL    Comment: Performed at [facility], [place] [address], [place], [place] [address]  Protime-INR     Status: None   Collection Time: 05/10/18 12:59 PM  Result Value Ref Range   Prothrombin Time 12.3 11.4 - 15.2 seconds   INR 0.9 0.8 - 1.2    Comment: (NOTE) INR goal varies based on device and disease states. Performed at [facility], [place] [address] [place], [place] [address]     Dg Chest 1 View  Result Date: 05/10/2018 CLINICAL DATA:  Fall and LEFT hip fracture. EXAM: CHEST  1 VIEW COMPARISON:  12/06/2017 and prior radiographs FINDINGS: The cardiomediastinal silhouette is unremarkable. Mild chronic peribronchial thickening again noted. There is no evidence of focal airspace disease, pulmonary edema, suspicious pulmonary nodule/mass, pleural effusion, or pneumothorax. No acute bony abnormalities are identified. IMPRESSION: No acute abnormality. Electronically Signed   By: Margarette Canada M.D.   On: 05/10/2018 14:04   Dg Ankle Complete Right  Result Date: 05/10/2018 CLINICAL DATA:  Acute RIGHT ankle pain following fall. Initial encounter. EXAM: RIGHT ANKLE - COMPLETE 3+ VIEW COMPARISON:  None. FINDINGS: No acute fracture, subluxation or dislocation. LATERAL soft tissue swelling is present. The joint space is unremarkable. No focal bony lesions are present. IMPRESSION: Soft tissue swelling without acute bony abnormality. Electronically Signed   By: Margarette Canada M.D.   On: 05/10/2018 14:06   Dg Hip Unilat With Pelvis 2-3 Views Left  Result Date: 05/10/2018 CLINICAL DATA:  Acute LEFT hip pain following fall. Initial encounter. EXAM: DG HIP (WITH OR WITHOUT PELVIS) 2-3V LEFT COMPARISON:  None. FINDINGS: A LEFT femoral neck fracture is identified with varus angulation. No dislocation. No definite focal bony lesions are present. IMPRESSION: LEFT femoral neck fracture with varus angulation. Electronically Signed   By: Margarette Canada M.D.   On: 05/10/2018 14:03    Pending Labs Unresulted Labs (From admission, onward)    Start     Ordered   05/10/18 1259  Type and screen [place],   STAT    Comments:  [place]    05/10/18 1259          Vitals/Pain Today's Vitals   05/10/18 1337 05/10/18 1400 05/10/18 1430 05/10/18 1509  BP: (!) 156/59 130/63 135/71   Pulse: 87 86 86   Resp: 16 13 13    Temp:      TempSrc:      SpO2:  94% 97% 98%   Weight:      Height:      PainSc:    2     Isolation Precautions No active isolations  Medications Medications  fentaNYL (SUBLIMAZE) injection 50 mcg (50 mcg Intravenous Given 05/10/18 1337)  potassium chloride  10 mEq in 100 mL IVPB (10 mEq Intravenous New Bag/Given 05/10/18 1509)  0.9 %  sodium chloride infusion ( Intravenous New Bag/Given 05/10/18 1508)    Mobility walks     Focused Assessments Hip Fx   R Recommendations: See Admitting Provider Note  Report given to:   Additional Notes: .

## 2018-05-10 NOTE — Op Note (Signed)
05/10/2018  6:41 PM  PATIENT:  Sheryl James   MRN: [id number]  PRE-OPERATIVE DIAGNOSIS:  left hip fracture, displaced femoral neck  POST-OPERATIVE DIAGNOSIS: left hip fracture, displaced femoral neck  PROCEDURE:  Procedure(s): LEFT HIP HEMIARTHROPLASTY  PREOPERATIVE INDICATIONS:  Sheryl James is an 63 y.o. female who was admitted 05/10/2018 with a diagnosis of LEFT femoral neck fracture and elected for surgical management.  The risks benefits and alternatives were discussed with the patient including but not limited to the risks of nonoperative treatment, versus surgical intervention including infection, bleeding, nerve injury, periprosthetic fracture, the need for revision surgery, dislocation, leg length discrepancy, blood clots, cardiopulmonary complications, morbidity, mortality, among others, and they were willing to proceed.  Predicted outcome is good, although there will be at least a six to nine month expected recovery.   OPERATIVE REPORT     SURGEON:  Marchia Bond, MD    ASSISTANT:  Joya Gaskins, OPA-C  (Present throughout the entire procedure,  necessary for completion of procedure in a timely manner, assisting with retraction, instrumentation, and closure)     ANESTHESIA:  general  ESTIMATED BLOOD LOSS: 846 ml    COMPLICATIONS:  None.   UNIQUE ASPECTS OF THE CASE: She had a very small calcar crack, but did not look structurally significant.  The stem was extremely stable.  Her trochanter was fairly short, the neck was also fairly short, soft tissue tension was appropriate.  The metaphysis felt tighter than the diaphysis.  Having recognized the small crack, I did not push the trial reduction, and in fact elected to proceed with the real implantation based on soft tissue tension and my assessment of the overall length.     COMPONENTS:  Chemical engineer Femoral Fracture stem size 5, with a -1.5 spacer and a size 45 fracture head unipolar hip ball.    PROCEDURE  IN DETAIL: The patient was met in the holding area and identified.  The appropriate hip  was marked at the operative site. The patient was then transported to the OR and  placed under anesthesia.  At that point, the patient was  placed in the lateral decubitus position with the operative side up and  secured to the operating room table and all bony prominences padded.     The operative lower extremity was prepped from the iliac crest to the toes.  Sterile draping was performed.  Time out was performed prior to incision.      A routine posterolateral approach was utilized via sharp dissection  carried down to the subcutaneous tissue.  Gross bleeders were Bovie  coagulated.  The iliotibial band was identified and incised  along the length of the skin incision.  Self-retaining retractors were  inserted.  With the hip internally rotated, the short external rotators  were identified. The piriformis was tagged with FiberWire, and the hip capsule released in a T-type fashion.  The femoral neck was exposed, and I resected the femoral neck using the appropriate jig. This was performed at approximately a thumb's breadth above the lesser trochanter.    I then exposed the deep acetabulum, cleared out any tissue including the ligamentum teres, and included the hip capsule in the FiberWire used above and below the T.    I then prepared the proximal femur using the cookie-cutter, the lateralizing reamer, and then sequentially broached.  Initially I placed a size 5 broach, and attempted with a 0 neck length, but this was too tight and would not  fully reduce.  Upon removing it I recognized a very small calcar crack that remained above the lesser tuberosity.  This was almost like a bend in the very most proximal portion of the bone.  Given the tightness, the position, and the appearance, I elected to proceed with the final implantation of both the implant and the head.  I then place the real implant and I impacted the  real head ball into place. The hip was then reduced and taken through functional range of motion and found to have excellent stability. Leg lengths were restored.  The stem appeared rotationally stable, and the small bend in the bone moved only very slightly in the most extreme amounts of flexion and internal rotation.  I then used a 2 mm drill bits to pass the FiberWire suture from the capsule and piriformis through the greater trochanter, and secured this. Excellent posterior capsular repair was achieved. I also closed the T in the capsule.  I then irrigated the hip copiously again with pulse lavage, and repaired the fascia with Vicryl, followed by Vicryl for the subcutaneous tissue, Monocryl for the skin, Steri-Strips and sterile gauze. The wounds were injected. The patient was then awakened and returned to PACU in stable and satisfactory condition. There were no complications.  Plan for 1 month of lovenox, WBAT.  Marchia Bond, MD Orthopedic Surgeon [phone]   05/10/2018 6:41 PM

## 2018-05-10 NOTE — Discharge Instructions (Signed)
INSTRUCTIONS AFTER JOINT REPLACEMENT   o Remove items at home which could result in a fall. This includes throw rugs or furniture in walking pathways o ICE to the affected joint every three hours while awake for 30 minutes at a time, for at least the first 3-5 days, and then as needed for pain and swelling.  Continue to use ice for pain and swelling. You may notice swelling that will progress down to the foot and ankle.  This is normal after surgery.  Elevate your leg when you are not up walking on it.   o Continue to use the breathing machine you got in the hospital (incentive spirometer) which will help keep your temperature down.  It is common for your temperature to cycle up and down following surgery, especially at night when you are not up moving around and exerting yourself.  The breathing machine keeps your lungs expanded and your temperature down.   DIET:  As you were doing prior to hospitalization, we recommend a well-balanced diet.  DRESSING / WOUND CARE / SHOWERING  You may change your dressing 3-5 days after surgery.  Then change the dressing every day with sterile gauze.  Please use good hand washing techniques before changing the dressing.  Do not use any lotions or creams on the incision until instructed by your surgeon.  ACTIVITY  o Increase activity slowly as tolerated, but follow the weight bearing instructions below.   o No driving for 6 weeks or until further direction given by your physician.  You cannot drive while taking narcotics.  o No lifting or carrying greater than 10 lbs. until further directed by your surgeon. o Avoid periods of inactivity such as sitting longer than an hour when not asleep. This helps prevent blood clots.  o You may return to work once you are authorized by your doctor.     WEIGHT BEARING   Partial weight bearing with assist device as directed.  50%   EXERCISES  Results after joint replacement surgery are often greatly improved when you  follow the exercise, range of motion and muscle strengthening exercises prescribed by your doctor. Safety measures are also important to protect the joint from further injury. Any time any of these exercises cause you to have increased pain or swelling, decrease what you are doing until you are comfortable again and then slowly increase them. If you have problems or questions, call your caregiver or physical therapist for advice.   Rehabilitation is important following a joint replacement. After just a few days of immobilization, the muscles of the leg can become weakened and shrink (atrophy).  These exercises are designed to build up the tone and strength of the thigh and leg muscles and to improve motion. Often times heat used for twenty to thirty minutes before working out will loosen up your tissues and help with improving the range of motion but do not use heat for the first two weeks following surgery (sometimes heat can increase post-operative swelling).   These exercises can be done on a training (exercise) mat, on the floor, on a table or on a bed. Use whatever works the best and is most comfortable for you.    Use music or television while you are exercising so that the exercises are a pleasant break in your day. This will make your life better with the exercises acting as a break in your routine that you can look forward to.   Perform all exercises about fifteen times, three  times per day or as directed.  You should exercise both the operative leg and the other leg as well.  Exercises include:    Quad Sets - Tighten up the muscle on the front of the thigh (Quad) and hold for 5-10 seconds.    Straight Leg Raises - With your knee straight (if you were given a brace, keep it on), lift the leg to 60 degrees, hold for 3 seconds, and slowly lower the leg.  Perform this exercise against resistance later as your leg gets stronger.   Leg Slides: Lying on your back, slowly slide your foot toward your  buttocks, bending your knee up off the floor (only go as far as is comfortable). Then slowly slide your foot back down until your leg is flat on the floor again.   Angel Wings: Lying on your back spread your legs to the side as far apart as you can without causing discomfort.   Hamstring Strength:  Lying on your back, push your heel against the floor with your leg straight by tightening up the muscles of your buttocks.  Repeat, but this time bend your knee to a comfortable angle, and push your heel against the floor.  You may put a pillow under the heel to make it more comfortable if necessary.   A rehabilitation program following joint replacement surgery can speed recovery and prevent re-injury in the future due to weakened muscles. Contact your doctor or a physical therapist for more information on knee rehabilitation.    CONSTIPATION  Constipation is defined medically as fewer than three stools per week and severe constipation as less than one stool per week.  Even if you have a regular bowel pattern at home, your normal regimen is likely to be disrupted due to multiple reasons following surgery.  Combination of anesthesia, postoperative narcotics, change in appetite and fluid intake all can affect your bowels.   YOU MUST use at least one of the following options; they are listed in order of increasing strength to get the job done.  They are all available over the counter, and you may need to use some, POSSIBLY even all of these options:    Drink plenty of fluids (prune juice may be helpful) and high fiber foods Colace 100 mg by mouth twice a day  Senokot for constipation as directed and as needed Dulcolax (bisacodyl), take with full glass of water  Miralax (polyethylene glycol) once or twice a day as needed.  If you have tried all these things and are unable to have a bowel movement in the first 3-4 days after surgery call either your surgeon or your primary doctor.    If you experience  loose stools or diarrhea, hold the medications until you stool forms back up.  If your symptoms do not get better within 1 week or if they get worse, check with your doctor.  If you experience "the worst abdominal pain ever" or develop nausea or vomiting, please contact the office immediately for further recommendations for treatment.   ITCHING:  If you experience itching with your medications, try taking only a single pain pill, or even half a pain pill at a time.  You can also use Benadryl over the counter for itching or also to help with sleep.   TED HOSE STOCKINGS:  Use stockings on both legs until for at least 2 weeks or as directed by physician office. They may be removed at night for sleeping.  MEDICATIONS:  See your medication  summary on the After Visit Summary that nursing will review with you.  You may have some home medications which will be placed on hold until you complete the course of blood thinner medication.  It is important for you to complete the blood thinner medication as prescribed.  PRECAUTIONS:  If you experience chest pain or shortness of breath - call 911 immediately for transfer to the hospital emergency department.   If you develop a fever greater that 101 F, purulent drainage from wound, increased redness or drainage from wound, foul odor from the wound/dressing, or calf pain - CONTACT YOUR SURGEON.                                                   FOLLOW-UP APPOINTMENTS:  If you do not already have a post-op appointment, please call the office for an appointment to be seen by your surgeon.  Guidelines for how soon to be seen are listed in your After Visit Summary, but are typically between 1-4 weeks after surgery.  OTHER INSTRUCTIONS:   Knee Replacement:  Do not place pillow under knee, focus on keeping the knee straight while resting. CPM instructions: 0-90 degrees, 2 hours in the morning, 2 hours in the afternoon, and 2 hours in the evening. Place foam block,  curve side up under heel at all times except when in CPM or when walking.  DO NOT modify, tear, cut, or change the foam block in any way.  MAKE SURE YOU:   Understand these instructions.   Get help right away if you are not doing well or get worse.    Thank you for letting us be a part of your medical care team.  It is a privilege we respect greatly.  We hope these instructions will help you stay on track for a fast and full recovery!

## 2018-05-10 NOTE — Anesthesia Preprocedure Evaluation (Signed)
Anesthesia Evaluation  Patient identified by MRN, date of birth, ID band Patient awake    Reviewed: Allergy & Precautions, H&P , NPO status , Patient's Chart, lab work & pertinent test results  Airway Mallampati: II   Neck ROM: full    Dental   Pulmonary Current Smoker,    breath sounds clear to auscultation       Cardiovascular hypertension,  Rhythm:regular Rate:Normal     Neuro/Psych    GI/Hepatic   Endo/Other    Renal/GU      Musculoskeletal Left hip fx   Abdominal   Peds  Hematology   Anesthesia Other Findings   Reproductive/Obstetrics                             Anesthesia Physical Anesthesia Plan  ASA: II  Anesthesia Plan: General   Post-op Pain Management:    Induction: Intravenous  PONV Risk Score and Plan: 2 and Ondansetron, Dexamethasone, Midazolam and Treatment may vary due to age or medical condition  Airway Management Planned: Oral ETT  Additional Equipment:   Intra-op Plan:   Post-operative Plan: Extubation in OR  Informed Consent: I have reviewed the patients History and Physical, chart, labs and discussed the procedure including the risks, benefits and alternatives for the proposed anesthesia with the patient or authorized representative who has indicated his/her understanding and acceptance.       Plan Discussed with: CRNA, Anesthesiologist and Surgeon  Anesthesia Plan Comments:         Anesthesia Quick Evaluation

## 2018-05-10 NOTE — Anesthesia Procedure Notes (Signed)
Procedure Name: Intubation Performed by: Orvin Netter J, CRNA Pre-anesthesia Checklist: Patient identified, Emergency Drugs available, Suction available, Patient being monitored and Timeout performed Patient Re-evaluated:Patient Re-evaluated prior to induction Oxygen Delivery Method: Circle system utilized Preoxygenation: Pre-oxygenation with 100% oxygen Induction Type: IV induction Ventilation: Mask ventilation without difficulty Laryngoscope Size: Mac and 4 Grade View: Grade I Tube type: Oral Tube size: 7.0 mm Number of attempts: 1 Airway Equipment and Method: Stylet Placement Confirmation: ETT inserted through vocal cords under direct vision,  positive ETCO2 and breath sounds checked- equal and bilateral Secured at: 21 cm Tube secured with: Tape Dental Injury: Teeth and Oropharynx as per pre-operative assessment        

## 2018-05-10 NOTE — Consult Note (Signed)
ORTHOPAEDIC CONSULTATION  REQUESTING PHYSICIAN: Kayleen Memos, DO  Chief Complaint: Left hip pain  HPI: Sheryl James is a 63 y.o. female who complains of acute left hip pain before mechanical fall.  She says she was actually pivoting on the leg, after standing up from sitting on her foot, her foot was asleep, and she developed acute pain in her hip, and then fell to the ground.  She was unable to ambulate, movement makes it worse, pain is rated as moderate to severe with movement, rest makes it better.  History reviewed. No pertinent past medical history. Past Surgical History:  Procedure Laterality Date  . IR ANGIO INTRA EXTRACRAN SEL INTERNAL CAROTID BILAT MOD SED  11/27/2017  . IR ANGIO VERTEBRAL SEL VERTEBRAL UNI L MOD SED  11/27/2017  . IR ANGIOGRAM FOLLOW UP STUDY  11/27/2017  . IR ANGIOGRAM FOLLOW UP STUDY  11/27/2017  . IR ANGIOGRAM FOLLOW UP STUDY  11/27/2017  . IR ANGIOGRAM FOLLOW UP STUDY  11/27/2017  . IR TRANSCATH/EMBOLIZ  11/27/2017  . IR US GUIDE VASC ACCESS RIGHT  11/27/2017  . RADIOLOGY WITH ANESTHESIA N/A 11/27/2017   Procedure: IR WITH ANESTHESIA;  Surgeon: Consuella Lose, MD;  Location: [facility];  Service: Radiology;  Laterality: N/A;   Social History   Socioeconomic History  . Marital status: Married    Spouse name: Not on file  . Number of children: Not on file  . Years of education: Not on file  . Highest education level: Not on file  Occupational History  . Not on file  Social Needs  . Financial resource strain: Not on file  . Food insecurity:    Worry: Not on file    Inability: Not on file  . Transportation needs:    Medical: Not on file    Non-medical: Not on file  Tobacco Use  . Smoking status: Current Every Day Smoker    Packs/day: 0.50    Types: Cigarettes  . Smokeless tobacco: Never Used  Substance and Sexual Activity  . Alcohol use: Never    Frequency: Never  . Drug use: Never  . Sexual activity: Not on file  Lifestyle  . Physical  activity:    Days per week: Not on file    Minutes per session: Not on file  . Stress: Not on file  Relationships  . Social connections:    Talks on phone: Not on file    Gets together: Not on file    Attends religious service: Not on file    Active member of club or organization: Not on file    Attends meetings of clubs or organizations: Not on file    Relationship status: Not on file  Other Topics Concern  . Not on file  Social History Narrative  . Not on file   History reviewed. No pertinent family history. No Known Allergies   Positive ROS: All other systems have been reviewed and were otherwise negative with the exception of those mentioned in the HPI and as above.  Physical Exam: General: Alert, no acute distress Cardiovascular: No pedal edema Respiratory: No cyanosis, no use of accessory musculature GI: No organomegaly, abdomen is soft and non-tender Skin: No lesions in the area of chief complaint Neurologic: Sensation intact distally Psychiatric: Patient is competent for consent with normal mood and affect Lymphatic: No axillary or cervical lymphadenopathy  MUSCULOSKELETAL: Left leg is shortened with external rotation, EHL and FHL are intact, sensation grossly intact throughout the foot.  Assessment:  Active Problems:   Closed left hip fracture (HCC)  Coexisting history of intracranial bleed  Plan: This is an acute severe injury and she has risk factors including her history of intracranial bleed.  Not sure what the best anticoagulation will be, I may consult pharmacy regarding this.  For the treatment of her femoral neck fracture I recommended hemiarthroplasty.  The risks benefits and alternatives were discussed with the patient including but not limited to the risks of nonoperative treatment, versus surgical intervention including infection, bleeding, nerve injury, periprosthetic fracture, the need for revision surgery, dislocation, leg length discrepancy, blood  clots, cardiopulmonary complications, morbidity, mortality, among others, and they were willing to proceed.       Johnny Bridge, MD Cell [phone]   05/10/2018 4:04 PM

## 2018-05-10 NOTE — ED Triage Notes (Signed)
EMS reports from home, Pt states while moving from sitting to standing heard pop in left hip then fell onto back and also twisted right ankle. No LOC, no blood thinners. Obvious shortening and rotation to left leg and obvious deformity and swelling to right ankle.  BP 230/100 HR 80 RR 20 Sp02 90 RA (100 4ltrs O2)  18 Left AC 123mcg Fentanyl enroute

## 2018-05-10 NOTE — Transfer of Care (Signed)
Immediate Anesthesia Transfer of Care Note  Patient: Sheryl James  Procedure(s) Performed: ARTHROPLASTY BIPOLAR HIP (HEMIARTHROPLASTY) (Left Hip)  Patient Location: PACU  Anesthesia Type:General  Level of Consciousness: awake and patient cooperative  Airway & Oxygen Therapy: Patient Spontanous Breathing and Patient connected to face mask  Post-op Assessment: Report given to RN and Post -op Vital signs reviewed and stable  Post vital signs: Reviewed and stable  Last Vitals:  Vitals Value Taken Time  BP    Temp    Pulse    Resp    SpO2      Last Pain:  Vitals:   05/10/18 1509  TempSrc:   PainSc: 2          Complications: No apparent anesthesia complications

## 2018-05-10 NOTE — ED Provider Notes (Signed)
[facility] DEPT Provider Note   CSN: [id number] Arrival date & time: 05/10/18  1224    History   Chief Complaint Chief Complaint  Patient presents with  . Fall  . Hip Injury  . Ankle Injury    HPI Sheryl James is a 63 y.o. female.  She has a history of subarachnoid that is left her with some cognitive difficulties.  She said she was sitting at the computer today and stood up and had acute pain in her left hip that caused her to fall.  She also injured her right ankle at the time.  She is complaining of 7 out of 10 pain which is better than prior when she was given some fentanyl.  She is not on any blood thinners.  She denies striking her head and no loss of consciousness.  No neck or back pain no chest pain no abdominal pain no numbness no weakness.     The history is provided by the patient and a relative.  Fall  This is a new problem. The current episode started 1 to 2 hours ago. The problem occurs constantly. The problem has not changed since onset.Pertinent negatives include no chest pain, no abdominal pain, no headaches and no shortness of breath. The symptoms are aggravated by bending, twisting and standing. The symptoms are relieved by medications. She has tried nothing for the symptoms. The treatment provided no relief.    History reviewed. No pertinent past medical history.  Patient Active Problem List   Diagnosis Date Noted  . Essential hypertension 03/12/2018  . Cognitive deficit, post-stroke   . Hypertension   . Hypoalbuminemia due to protein-calorie malnutrition ([place])   . Urinary retention   . Seizure prophylaxis   . SAH (subarachnoid hemorrhage) ([place]) 12/26/2017  . Subarachnoid hemorrhage due to ruptured aneurysm ([place]) 11/26/2017    Past Surgical History:  Procedure Laterality Date  . IR ANGIO INTRA EXTRACRAN SEL INTERNAL CAROTID BILAT MOD SED  11/27/2017  . IR ANGIO VERTEBRAL SEL VERTEBRAL UNI L MOD SED  11/27/2017  . IR  ANGIOGRAM FOLLOW UP STUDY  11/27/2017  . IR ANGIOGRAM FOLLOW UP STUDY  11/27/2017  . IR ANGIOGRAM FOLLOW UP STUDY  11/27/2017  . IR ANGIOGRAM FOLLOW UP STUDY  11/27/2017  . IR TRANSCATH/EMBOLIZ  11/27/2017  . IR US GUIDE VASC ACCESS RIGHT  11/27/2017  . RADIOLOGY WITH ANESTHESIA N/A 11/27/2017   Procedure: IR WITH ANESTHESIA;  Surgeon: Consuella Lose, MD;  Location: [facility];  Service: Radiology;  Laterality: N/A;     OB History   No obstetric history on file.      Home Medications    Prior to Admission medications   Medication Sig Start Date End Date Taking? Authorizing Provider  acetaminophen (TYLENOL) 325 MG tablet Take 2 tablets (650 mg total) by mouth every 4 (four) hours as needed for mild pain (or temp > 37.5 C (99.5 F)). Patient not taking: Reported on 03/25/2018 01/08/18   Angiulli, Lavon Paganini, PA-C  bethanechol (URECHOLINE) 10 MG tablet Take 1 tablet (10 mg total) by mouth 3 (three) times daily. Patient not taking: Reported on 03/25/2018 01/08/18   Angiulli, Lavon Paganini, PA-C  lisinopril (PRINIVIL,ZESTRIL) 10 MG tablet Take 10 mg by mouth daily.    [provider]  pantoprazole (PROTONIX) 40 MG tablet Take 1 tablet (40 mg total) by mouth daily. Patient not taking: Reported on 03/25/2018 01/08/18   Angiulli, Lavon Paganini, PA-C  potassium chloride (K-DUR) 10 MEQ tablet  Take 1 tablet (10 mEq total) by mouth daily for 5 days. Patient not taking: Reported on 01/21/2018 01/16/18 01/21/18  Caccavale, Sophia, PA-C  saccharomyces boulardii (FLORASTOR) 250 MG capsule Take 1 capsule (250 mg total) by mouth 2 (two) times daily. Patient not taking: Reported on 03/25/2018 01/08/18   Angiulli, Lavon Paganini, PA-C  tamsulosin (FLOMAX) 0.4 MG CAPS capsule Take 2 capsules (0.8 mg total) by mouth daily. Patient not taking: Reported on 03/25/2018 01/08/18   [place], Lavon Paganini, PA-C    Family History History reviewed. No pertinent family history.  Social History Social History   Tobacco Use  . Smoking  status: Current Every Day Smoker    Packs/day: 0.50    Types: Cigarettes  . Smokeless tobacco: Never Used  Substance Use Topics  . Alcohol use: Never    Frequency: Never  . Drug use: Never     Allergies   Patient has no known allergies.   Review of Systems Review of Systems  Constitutional: Negative for fever.  HENT: Negative for sore throat.   Eyes: Negative for visual disturbance.  Respiratory: Negative for shortness of breath.   Cardiovascular: Negative for chest pain.  Gastrointestinal: Negative for abdominal pain.  Genitourinary: Negative for dysuria.  Musculoskeletal: Negative for neck pain.  Skin: Negative for rash.  Neurological: Negative for headaches.     Physical Exam Updated Vital Signs BP (!) 97/48   Pulse 73   Temp 98.1 F (36.7 C) (Oral)   Resp 18   Ht 5' (1.524 m)   Wt 68 kg   SpO2 90%   BMI 29.29 kg/m   Physical Exam Vitals signs and nursing note reviewed.  Constitutional:      General: She is not in acute distress.    Appearance: She is well-developed.  HENT:     Head: Normocephalic and atraumatic.  Eyes:     Conjunctiva/sclera: Conjunctivae normal.  Neck:     Musculoskeletal: Neck supple.  Cardiovascular:     Rate and Rhythm: Normal rate and regular rhythm.     Heart sounds: No murmur.  Pulmonary:     Effort: Pulmonary effort is normal. No respiratory distress.     Breath sounds: Normal breath sounds.  Abdominal:     Palpations: Abdomen is soft.     Tenderness: There is no abdominal tenderness.  Musculoskeletal:        General: Swelling, tenderness, deformity and signs of injury present.     Right lower leg: No edema.     Left lower leg: No edema.     Comments: Full range of motion of her bilateral upper extremities with no pain or limitation.  Nontender midline C-spine T-spine L-spine.  Right lower extremity tenderness of the lateral malleolus with some overlying swelling.  No hip or knee pain no fifth metatarsal tenderness.  Left  lower extremity shortened and externally rotated.  Tenderness throughout the left hip.  Left knee and ankle nontender.  Distal neurovascular and pulses intact.  Skin:    General: Skin is warm and dry.     Capillary Refill: Capillary refill takes less than 2 seconds.  Neurological:     General: No focal deficit present.     Mental Status: She is alert. Mental status is at baseline.      ED Treatments / Results  Labs (all labs ordered are listed, but only abnormal results are displayed) Labs Reviewed  BASIC METABOLIC PANEL - Abnormal; Notable for the following components:  Result Value   Potassium 3.3 (*)    CO2 21 (*)    Glucose, Bld 117 (*)    Calcium 8.1 (*)    All other components within normal limits  CBC WITH DIFFERENTIAL/PLATELET - Abnormal; Notable for the following components:   WBC 14.6 (*)    Neutro Abs 12.8 (*)    All other components within normal limits  CBC - Abnormal; Notable for the following components:   Hemoglobin 11.8 (*)    All other components within normal limits  BASIC METABOLIC PANEL - Abnormal; Notable for the following components:   Glucose, Bld 151 (*)    Calcium 8.3 (*)    All other components within normal limits  PROTIME-INR  MAGNESIUM  TYPE AND SCREEN  ABO/RH    EKG EKG Interpretation  Date/Time:  Sunday May 10 2018 13:53:19 EDT Ventricular Rate:  84 PR Interval:    QRS Duration: 96 QT Interval:  512 QTC Calculation: 606 R Axis:   81 Text Interpretation:  Sinus rhythm Prominent P waves, nondiagnostic Borderline right axis deviation Borderline T wave abnormalities Prolonged QT interval similar to prior 11/19 Confirmed by Aletta Edouard [phone]) on 05/10/2018 2:28:02 PM   Radiology Dg Chest 1 View  Result Date: 05/10/2018 CLINICAL DATA:  Fall and LEFT hip fracture. EXAM: CHEST  1 VIEW COMPARISON:  12/06/2017 and prior radiographs FINDINGS: The cardiomediastinal silhouette is unremarkable. Mild chronic peribronchial thickening  again noted. There is no evidence of focal airspace disease, pulmonary edema, suspicious pulmonary nodule/mass, pleural effusion, or pneumothorax. No acute bony abnormalities are identified. IMPRESSION: No acute abnormality. Electronically Signed   By: Margarette Canada M.D.   On: 05/10/2018 14:04   Dg Ankle Complete Right  Result Date: 05/10/2018 CLINICAL DATA:  Acute RIGHT ankle pain following fall. Initial encounter. EXAM: RIGHT ANKLE - COMPLETE 3+ VIEW COMPARISON:  None. FINDINGS: No acute fracture, subluxation or dislocation. LATERAL soft tissue swelling is present. The joint space is unremarkable. No focal bony lesions are present. IMPRESSION: Soft tissue swelling without acute bony abnormality. Electronically Signed   By: Margarette Canada M.D.   On: 05/10/2018 14:06   Pelvis Portable  Result Date: 05/10/2018 CLINICAL DATA:  Status post left hip replacement. EXAM: PORTABLE PELVIS 1-2 VIEWS COMPARISON:  Abdomen and pelvis CT dated 08/29/2007. FINDINGS: Interval left hip bipolar prosthesis. No fracture or dislocation. IMPRESSION: Satisfactory postoperative appearance of a left hip bipolar prosthesis. Electronically Signed   By: Claudie Revering M.D.   On: 05/10/2018 19:34   Dg Hip Unilat With Pelvis 2-3 Views Left  Result Date: 05/10/2018 CLINICAL DATA:  Acute LEFT hip pain following fall. Initial encounter. EXAM: DG HIP (WITH OR WITHOUT PELVIS) 2-3V LEFT COMPARISON:  None. FINDINGS: A LEFT femoral neck fracture is identified with varus angulation. No dislocation. No definite focal bony lesions are present. IMPRESSION: LEFT femoral neck fracture with varus angulation. Electronically Signed   By: Margarette Canada M.D.   On: 05/10/2018 14:03    Procedures Procedures (including critical care time)  Medications Ordered in ED Medications  fentaNYL (SUBLIMAZE) injection 50 mcg (has no administration in time range)     Initial Impression / Assessment and Plan / ED Course  I have reviewed the triage vital signs and  the nursing notes.  Pertinent labs & imaging results that were available during my care of the patient were reviewed by me and considered in my medical decision making (see chart for details).  Clinical Course as of May 10 637  Sun  May 10, 2018  1445 With left hip fracture.  I talked to Dr. Mardelle Matte from orthopedics.  He has the patient be kept n.p.o. and contact medicine for admission.  He is anticipating she may go to the OR today.   [MB]  1533 Discussed with Dr. Nevada Crane from [facility] hospitalist service who will evaluate the patient for admission.   [MB]    Clinical Course User Index [MB] Hayden Rasmussen, MD        Final Clinical Impressions(s) / ED Diagnoses   Final diagnoses:  Closed left hip fracture, initial encounter [facility])  Sprain of right ankle, unspecified ligament, initial encounter    ED Discharge Orders    None       Hayden Rasmussen, MD 05/11/18 [phone]

## 2018-05-11 LAB — CBC
HCT: 36.3 % (ref 36.0–46.0)
Hemoglobin: 11.8 g/dL — ABNORMAL LOW (ref 12.0–15.0)
MCH: 29.9 pg (ref 26.0–34.0)
MCHC: 32.5 g/dL (ref 30.0–36.0)
MCV: 91.9 fL (ref 80.0–100.0)
Platelets: 260 10*3/uL (ref 150–400)
RBC: 3.95 MIL/uL (ref 3.87–5.11)
RDW: 13.6 % (ref 11.5–15.5)
WBC: 9.4 10*3/uL (ref 4.0–10.5)
nRBC: 0 % (ref 0.0–0.2)

## 2018-05-11 LAB — BASIC METABOLIC PANEL
Anion gap: 6 (ref 5–15)
BUN: 12 mg/dL (ref 8–23)
CO2: 23 mmol/L (ref 22–32)
Calcium: 8.3 mg/dL — ABNORMAL LOW (ref 8.9–10.3)
Chloride: 108 mmol/L (ref 98–111)
Creatinine, Ser: 0.58 mg/dL (ref 0.44–1.00)
GFR calc Af Amer: >60 mL/min (ref >60)
GLUCOSE: 151 mg/dL — AB (ref 70–99)
Potassium: 4.8 mmol/L (ref 3.5–5.1)
Sodium: 137 mmol/L (ref 135–145)

## 2018-05-11 LAB — MAGNESIUM: Magnesium: 2.4 mg/dL (ref 1.7–2.4)

## 2018-05-11 NOTE — Plan of Care (Signed)
?  Problem: Education: ?Goal: Knowledge of General Education information will improve ?Description: Including pain rating scale, medication(s)/side effects and non-pharmacologic comfort measures ?Outcome: Progressing ?  ?Problem: Health Behavior/Discharge Planning: ?Goal: Ability to manage health-related needs will improve ?Outcome: Progressing ?  ?Problem: Clinical Measurements: ?Goal: Ability to maintain clinical measurements within normal limits will improve ?Outcome: Progressing ?Goal: Will remain free from infection ?Outcome: Progressing ?Goal: Diagnostic test results will improve ?Outcome: Progressing ?Goal: Respiratory complications will improve ?Outcome: Progressing ?Goal: Cardiovascular complication will be avoided ?Outcome: Progressing ?  ?Problem: Coping: ?Goal: Level of anxiety will decrease ?Outcome: Progressing ?  ?Problem: Pain Managment: ?Goal: General experience of comfort will improve ?Outcome: Progressing ?  ?Problem: Safety: ?Goal: Ability to remain free from injury will improve ?Outcome: Progressing ?  ?Problem: Skin Integrity: ?Goal: Risk for impaired skin integrity will decrease ?Outcome: Progressing ?  ?

## 2018-05-11 NOTE — Progress Notes (Signed)
OT Cancellation Note  Patient Details Name: Sheryl James MRN: [id number] DOB: 01-02-1956   Cancelled Treatment:    Reason Eval/Treat Not Completed: Other (comment)  Noted plan for SNF- will defer OT eval to SNF   Kari Baars, [place] [phone] Office- [phone], [place] 05/11/2018, 3:34 PM

## 2018-05-11 NOTE — Evaluation (Signed)
Physical Therapy Evaluation Patient Details Name: Sheryl James MRN: [id number] DOB: 15-Apr-1955 Today's Date: 05/11/2018   History of Present Illness  63 year old female with history of hypertension, spontaneous subarachnoid hemorrhage status post coil embolization 11/2017 with prolonged hospitalization and rehab, GERD who was admitted on 05/10/2018 after mechanical fall and left hip fracture.  Pt s/p left hip hemi-arthroplasty on 05/10/18.  Clinical Impression  Patient is s/p above surgery resulting in functional limitations due to the deficits listed below (see PT Problem List).  Patient will benefit from skilled PT to increase their independence and safety with mobility to allow discharge to the venue listed below.  Pt requiring assist for mobility at this time and frequent cues for technique and maintaining posterior hip precautions.  Pt high fall risk.  Spouse agreeable to SNF upon d/c as he will not be home during the day to assist pt.  Pt would benefit from SNF upon d/c to improve mobility, balance, and independence prior to d/c home.     Follow Up Recommendations SNF    Equipment Recommendations  None recommended by PT    Recommendations for Other Services       Precautions / Restrictions Precautions Precautions: Posterior Hip;Fall Precaution Comments: educated on PWB and posterior hip precautions Restrictions Weight Bearing Restrictions: Yes LLE Weight Bearing: Partial weight bearing LLE Partial Weight Bearing Percentage or Pounds: 50%      Mobility  Bed Mobility Overal bed mobility: Needs Assistance Bed Mobility: Supine to Sit     Supine to sit: Mod assist;HOB elevated     General bed mobility comments: verbal cues for maintaining precautions, pt did not initially follow instructions so L LE supported to maintain precautions as pt somewhat rolled and sat up on right EOB  Transfers Overall transfer level: Needs assistance Equipment used: Rolling walker (2  wheeled) Transfers: Sit to/from Stand Sit to Stand: Mod assist;+2 safety/equipment         General transfer comment: verbal and tactile cues for UE and LE positioning and maintaining precautions, assist to rise and steady, pt with posterior lean and had difficulty achieving good BOS  Ambulation/Gait Ambulation/Gait assistance: Min assist;+2 safety/equipment Gait Distance (Feet): 8 Feet(x2) Assistive device: Rolling walker (2 wheeled) Gait Pattern/deviations: Step-to pattern;Decreased stance time - left;Antalgic;Decreased weight shift to left     General Gait Details: verbal cues for sequence, PWB, step length, RW positioning; ambulated to/from bathroom within room, pt fatigued quickly  Stairs            Wheelchair Mobility    Modified Rankin (Stroke Patients Only)       Balance Overall balance assessment: History of Falls;Needs assistance         Standing balance support: Bilateral upper extremity supported Standing balance-Leahy Scale: Poor Standing balance comment: requires external assist and UE support               High Level Balance Comments: pt was receiving OP PT for balance activites prior to this admission             Pertinent Vitals/Pain Pain Assessment: No/denies pain    Home Living   Living Arrangements: Spouse/significant other     Home Access: Stairs to enter   Entrance Stairs-Number of Steps: 4 Home Layout: One level Home Equipment: Walker - 2 wheels      Prior Function Level of Independence: Independent         Comments: was independent prior to subarachnoid hemorrhage; has been working with OP PT currently  Hand Dominance        Extremity/Trunk Assessment        Lower Extremity Assessment Lower Extremity Assessment: Generalized weakness;LLE deficits/detail LLE Deficits / Details: maintained precautions, required assist for bed mobility, anticipated L hip post op weakness, able to perform ankle pumps        Communication   Communication: No difficulties  Cognition Arousal/Alertness: Awake/alert Behavior During Therapy: WFL for tasks assessed/performed                                          General Comments      Exercises     Assessment/Plan    PT Assessment Patient needs continued PT services  PT Problem List Decreased strength;Decreased mobility;Decreased balance;Decreased knowledge of use of DME;Decreased knowledge of precautions;Decreased activity tolerance;Decreased safety awareness       PT Treatment Interventions DME instruction;Functional mobility training;Gait training;Therapeutic activities;Balance training;Patient/family education;Neuromuscular re-education;Therapeutic exercise    PT Goals (Current goals can be found in the Care Plan section)  Acute Rehab PT Goals PT Goal Formulation: With patient/family Time For Goal Achievement: 05/25/18 Potential to Achieve Goals: Good    Frequency Min 3X/week   Barriers to discharge        Co-evaluation               AM-PAC PT "6 Clicks" Mobility  Outcome Measure Help needed turning from your back to your side while in a flat bed without using bedrails?: A Lot Help needed moving from lying on your back to sitting on the side of a flat bed without using bedrails?: A Lot Help needed moving to and from a bed to a chair (including a wheelchair)?: A Lot Help needed standing up from a chair using your arms (e.g., wheelchair or bedside chair)?: A Lot Help needed to walk in hospital room?: A Little Help needed climbing 3-5 steps with a railing? : Total 6 Click Score: 12    End of Session Equipment Utilized During Treatment: Gait belt Activity Tolerance: Patient tolerated treatment well Patient left: in chair;with chair alarm set;with call bell/phone within reach;with family/visitor present Nurse Communication: Mobility status;Precautions;Weight bearing status PT Visit Diagnosis: History of falling  (Z91.81);Unsteadiness on feet (R26.81)    Time: 2122-4825 PT Time Calculation (min) (ACUTE ONLY): 28 min   Charges:   PT Evaluation $PT Eval Low Complexity: 1 Low PT Treatments $Gait Training: 8-22 mins       Carmelia Bake, PT, DPT Acute Rehabilitation Services Office: [phone] Pager: [phone]  Trena Platt 05/11/2018, 12:13 PM

## 2018-05-11 NOTE — Progress Notes (Signed)
Assumed care of pt at 1200. Agree with previous RN assessment. Pt up in chair c/o pain 8/10 in left hip. Norco given.   Kalysta Kneisley, Bing Neighbors, RN

## 2018-05-11 NOTE — Progress Notes (Signed)
PROGRESS NOTE    Sheryl James  UQJ:[id number] DOB: [birth year] DOA: 05/10/2018 PCP: Patient, No Pcp Per    Brief Narrative:  62 year old female with history of hypertension, spontaneous subarachnoid hemorrhage status post coil embolization 11/2017 with prolonged hospitalization and rehab, GERD who was admitted on 05/10/2018 after mechanical fall and left hip fracture.  She underwent left hemi-arthroplasty.   Assessment & Plan:   Active Problems:   Closed left hip fracture, initial encounter [facility])  Close traumatic left hip fracture:  Status post left hip hemiarthroplasty.  Day 1 postop.  Adequate pain medications.  PT OT today. Lovenox for DVT prophylaxis. Case management consult, anticipating discharge to inpatient rehab versus SNF.  Patient is still doing outpatient therapy from previous subarachnoid hemorrhage.  QTC prolongation: Suspected.  Machine read QTC prolongation due to abnormal T waves.  Repeat EKG with normal QTC.  Leukocytosis: Reactive.  No evidence of infection.  Normalized.  Hypertension:Fairly stable.  Will resume all home medications.  GERD: On PPI.  Continue.  Subarachnoid hemorrhage:Currently stable.  Follows with neurosurgery.  Started on Lovenox for DVT prophylaxis.   DVT prophylaxis: Lovenox Code Status: Full code Family Communication: Husband at the bedside Disposition Plan: SNF versus acute rehab   Consultants:   Orthopedics  Procedures:   Left hip hemiarthroplasty  Antimicrobials:   None   Subjective: Patient was seen and examined at the bedside.  She was without any complaints.  Did not complain of any pain on movement in the bed.  No other overnight events.  She has not worked with PT OT yet.  Objective: Vitals:   05/10/18 2335 05/11/18 0450 05/11/18 0635 05/11/18 0956  BP: (!) 145/71 127/75  (!) 134/59  Pulse: 90 85  91  Resp: 14 12  18   Temp: 98.2 F (36.8 C) 97.9 F (36.6 C)  97.9 F (36.6 C)  TempSrc: Oral Oral  Oral   SpO2: 98% 96% 93% 91%  Weight:      Height:        Intake/Output Summary (Last 24 hours) at 05/11/2018 1043 Last data filed at 05/11/2018 3893 Gross per 24 hour  Intake 3560.8 ml  Output 1500 ml  Net 2060.8 ml   Filed Weights   05/10/18 1237 05/10/18 2007  Weight: 68 kg 69.7 kg    Examination:  General exam: Appears calm and comfortable  Respiratory system: Clear to auscultation. Respiratory effort normal. Cardiovascular system: S1 & S2 heard, RRR. No JVD, murmurs, rubs, gallops or clicks. No pedal edema. Gastrointestinal system: Abdomen is nondistended, soft and nontender. No organomegaly or masses felt. Normal bowel sounds heard. Central nervous system: Alert and oriented. No focal neurological deficits. Extremities: Symmetric 5 x 5 power. Skin: No rashes, lesions or ulcers Psychiatry: Judgement and insight appear normal. Mood & affect appropriate.  Left lateral hip surgical incision clean and dry, minimal swelling around the surgical dressing.  Distal neurovascular status intact.   Data Reviewed: I have personally reviewed following labs and imaging studies  CBC: Recent Labs  Lab 05/10/18 1259 05/11/18 0431  WBC 14.6* 9.4  NEUTROABS 12.8*  --   HGB 13.4 11.8*  HCT 40.5 36.3  MCV 90.6 91.9  PLT 274 734   Basic Metabolic Panel: Recent Labs  Lab 05/10/18 1259 05/11/18 0431  NA 137 137  K 3.3* 4.8  CL 107 108  CO2 21* 23  GLUCOSE 117* 151*  BUN 16 12  CREATININE 0.56 0.58  CALCIUM 8.1* 8.3*  MG  --  2.4  GFR: Estimated Creatinine Clearance: 62.7 mL/min (by C-G formula based on SCr of 0.58 mg/dL). Liver Function Tests: No results for input(s): AST, ALT, ALKPHOS, BILITOT, PROT, ALBUMIN in the last 168 hours. No results for input(s): LIPASE, AMYLASE in the last 168 hours. No results for input(s): AMMONIA in the last 168 hours. Coagulation Profile: Recent Labs  Lab 05/10/18 1259  INR 0.9   Cardiac Enzymes: No results for input(s): CKTOTAL, CKMB,  CKMBINDEX, TROPONINI in the last 168 hours. BNP (last 3 results) No results for input(s): PROBNP in the last 8760 hours. HbA1C: No results for input(s): HGBA1C in the last 72 hours. CBG: No results for input(s): GLUCAP in the last 168 hours. Lipid Profile: No results for input(s): CHOL, HDL, LDLCALC, TRIG, CHOLHDL, LDLDIRECT in the last 72 hours. Thyroid Function Tests: No results for input(s): TSH, T4TOTAL, FREET4, T3FREE, THYROIDAB in the last 72 hours. Anemia Panel: No results for input(s): VITAMINB12, FOLATE, FERRITIN, TIBC, IRON, RETICCTPCT in the last 72 hours. Sepsis Labs: No results for input(s): PROCALCITON, LATICACIDVEN in the last 168 hours.  No results found for this or any previous visit (from the past 240 hour(s)).       Radiology Studies: Dg Chest 1 View  Result Date: 05/10/2018 CLINICAL DATA:  Fall and LEFT hip fracture. EXAM: CHEST  1 VIEW COMPARISON:  12/06/2017 and prior radiographs FINDINGS: The cardiomediastinal silhouette is unremarkable. Mild chronic peribronchial thickening again noted. There is no evidence of focal airspace disease, pulmonary edema, suspicious pulmonary nodule/mass, pleural effusion, or pneumothorax. No acute bony abnormalities are identified. IMPRESSION: No acute abnormality. Electronically Signed   By: Margarette Canada M.D.   On: 05/10/2018 14:04   Dg Ankle Complete Right  Result Date: 05/10/2018 CLINICAL DATA:  Acute RIGHT ankle pain following fall. Initial encounter. EXAM: RIGHT ANKLE - COMPLETE 3+ VIEW COMPARISON:  None. FINDINGS: No acute fracture, subluxation or dislocation. LATERAL soft tissue swelling is present. The joint space is unremarkable. No focal bony lesions are present. IMPRESSION: Soft tissue swelling without acute bony abnormality. Electronically Signed   By: Margarette Canada M.D.   On: 05/10/2018 14:06   Pelvis Portable  Result Date: 05/10/2018 CLINICAL DATA:  Status post left hip replacement. EXAM: PORTABLE PELVIS 1-2 VIEWS  COMPARISON:  Abdomen and pelvis CT dated 08/29/2007. FINDINGS: Interval left hip bipolar prosthesis. No fracture or dislocation. IMPRESSION: Satisfactory postoperative appearance of a left hip bipolar prosthesis. Electronically Signed   By: Claudie Revering M.D.   On: 05/10/2018 19:34   Dg Hip Unilat With Pelvis 2-3 Views Left  Result Date: 05/10/2018 CLINICAL DATA:  Acute LEFT hip pain following fall. Initial encounter. EXAM: DG HIP (WITH OR WITHOUT PELVIS) 2-3V LEFT COMPARISON:  None. FINDINGS: A LEFT femoral neck fracture is identified with varus angulation. No dislocation. No definite focal bony lesions are present. IMPRESSION: LEFT femoral neck fracture with varus angulation. Electronically Signed   By: Margarette Canada M.D.   On: 05/10/2018 14:03        Scheduled Meds: . acetaminophen  500 mg Oral Q6H  . bethanechol  10 mg Oral TID  . docusate sodium  100 mg Oral BID  . enoxaparin (LOVENOX) injection  40 mg Subcutaneous Q24H  . ferrous sulfate  325 mg Oral TID PC  . ketorolac  7.5 mg Intravenous Q6H  . lisinopril  10 mg Oral Daily  . pantoprazole  40 mg Oral Daily  . saccharomyces boulardii  250 mg Oral BID  . tamsulosin  0.8 mg Oral Daily  Continuous Infusions: . 0.9 % NaCl with KCl 20 mEq / L 75 mL/hr at 05/11/18 0600  . lactated ringers Stopped (05/10/18 2045)     LOS: 1 day    Time spent: 25 minutes    Barb Merino, MD [facility] Pager [phone]  If 7PM-7AM, please contact night-coverage [email or web address] Password [facility] 05/11/2018, 10:43 AM

## 2018-05-11 NOTE — Progress Notes (Addendum)
Patient ID: Sheryl James, female   DOB: [birth year], 63 y.o.   MRN: [id number]     Subjective:  Patient reports pain as mild to moderate.  Patient in bed and in no acute distress she denies any CP or SOB  Objective:   VITALS:   Vitals:   05/11/18 0450 05/11/18 0635 05/11/18 0956 05/11/18 1416  BP: 127/75  (!) 134/59 (!) 103/49  Pulse: 85  91 80  Resp: 12  18 20   Temp: 97.9 F (36.6 C)  97.9 F (36.6 C) 98 F (36.7 C)  TempSrc: Oral  Oral Oral  SpO2: 96% 93% 91% 95%  Weight:      Height:        ABD soft Sensation intact distally Dorsiflexion/Plantar flexion intact Incision: dressing C/D/I and no drainage   Lab Results  Component Value Date   WBC 9.4 05/11/2018   HGB 11.8 (L) 05/11/2018   HCT 36.3 05/11/2018   MCV 91.9 05/11/2018   PLT 260 05/11/2018   BMET    Component Value Date/Time   NA 137 05/11/2018 0431   K 4.8 05/11/2018 0431   CL 108 05/11/2018 0431   CO2 23 05/11/2018 0431   GLUCOSE 151 (H) 05/11/2018 0431   BUN 12 05/11/2018 0431   CREATININE 0.58 05/11/2018 0431   CALCIUM 8.3 (L) 05/11/2018 0431   GFRNONAA >60 05/11/2018 0431   GFRAA >60 05/11/2018 0431     Assessment/Plan: 1 Day Post-Op   Active Problems:   Closed left hip fracture, initial encounter (HCC)   Advance diet Up with therapy Continue plan per medicine WBAT Dry dressing PRN   Lunette Stands 05/11/2018, 4:09 PM  Discussed and agree with above.  Marchia Bond, MD Cell [phone]

## 2018-05-11 NOTE — TOC Initial Note (Addendum)
Transition of Care [facility]) - Initial/Assessment Note    Patient Details  Name: Sheryl James MRN: [id number] Date of Birth: [birth year]  Transition of Care [facility]) CM/SW Contact:    Lia Hopping, [place] Phone Number: 05/11/2018, 4:21 PM  Clinical Narrative:                 CSW discussed discharge planning with the patient and her spouse at bedside. Patient reports she prefers to return home with home health but the spouse cannot care for her at this time. Patient reports prior to falls he was fairly independent with ADL's.  Patient reports she has been going to outpatient therapy at neurological center.  Patient is agreeable to SNF rehab. CSW explain SNF process and provided bed offers to patient and spouse.  Patient and spouse agreeable to [place], [place]. SNF started the [facility] insurance process.   Expected Discharge Plan: Skilled Nursing Facility Barriers to Discharge: Insurance Authorization   Patient Goals and CMS Choice Patient states their goals for this hospitalization and ongoing recovery are:: Rehab, regain strength.  CMS Medicare.gov Compare Post Acute Care list provided to:: Patient Choice offered to / list presented to : Spouse  Expected Discharge Plan and Services Expected Discharge Plan: [place] Acute Care Choice: [place] Living arrangements for the past 2 months: Single Family Home                         Prior Living Arrangements/Services Living arrangements for the past 2 months: Single Family Home Lives with:: Spouse Patient language and need for interpreter reviewed:: No Do you feel safe going back to the place where you live?: Yes      Need for Family Participation in Patient Care: Yes (Comment) Care giver support system in place?: Yes (comment)   Criminal Activity/Legal Involvement Pertinent to Current Situation/Hospitalization: No - Comment as needed  Activities of Daily Living Home Assistive  Devices/Equipment: Eyeglasses ADL Screening (condition at time of admission) Patient's cognitive ability adequate to safely complete daily activities?: Yes Is the patient deaf or have difficulty hearing?: No Does the patient have difficulty seeing, even when wearing glasses/contacts?: No Does the patient have difficulty concentrating, remembering, or making decisions?: No Patient able to express need for assistance with ADLs?: Yes Does the patient have difficulty dressing or bathing?: No Independently performs ADLs?: Yes (appropriate for developmental age) Does the patient have difficulty walking or climbing stairs?: No Weakness of Legs: Left Weakness of Arms/Hands: None  Permission Sought/Granted Permission sought to share information with : Facility Sport and exercise psychologist, Family Supports Permission granted to share information with : Yes, Verbal Permission Granted     Permission granted to share info w AGENCY: SNF  Permission granted to share info w Relationship: Spouse     Emotional Assessment Appearance:: Appears stated age   Affect (typically observed): Calm Orientation: : Oriented to Self, Oriented to Place, Oriented to  Time, Oriented to Situation   Psych Involvement: No (comment)  Admission diagnosis:  Closed left hip fracture, initial encounter ([place]) [S72.002A] Sprain of right ankle, unspecified ligament, initial encounter [S93.401A] Patient Active Problem List   Diagnosis Date Noted  . Closed left hip fracture, initial encounter ([place]) 05/10/2018  . Essential hypertension 03/12/2018  . Cognitive deficit, post-stroke   . Hypertension   . Hypoalbuminemia due to protein-calorie malnutrition ([place])   . Urinary retention   . Seizure prophylaxis   . SAH (subarachnoid hemorrhage) ([place])  12/26/2017  . Subarachnoid hemorrhage due to ruptured aneurysm ([place]) 11/26/2017   PCP:  Patient, No Pcp Per Pharmacy:   [facility] [address] [place] [address] Phone: [phone] Fax: [phone]     Social Determinants of Health (SDOH) Interventions    Readmission Risk Interventions 30 Day Unplanned Readmission Risk Score     ED to Hosp-Admission (Current) from 05/10/2018 in [place]  30 Day Unplanned Readmission Risk Score (%)  16 Filed at 05/11/2018 1600     This score is the patient's risk of an unplanned readmission within 30 days of being discharged (0 -100%). The score is based on dignosis, age, lab data, medications, orders, and past utilization.   Low:  0-14.9   Medium: 15-21.9   High: 22-29.9   Extreme: 30 and above       No flowsheet data found.

## 2018-05-11 NOTE — Plan of Care (Signed)

## 2018-05-11 NOTE — NC FL2 (Signed)
[place] LEVEL OF CARE SCREENING TOOL     IDENTIFICATION  Patient Name: Sheryl James Birthdate: January 05, 1956 Sex: female Admission Date (Current Location): 05/10/2018  [facility] and [place] Number:  Herbalist and Address:  [facility],  [place] [address] [place]      Provider Number: [id number]  Attending Physician Name and Address:  Barb Merino, MD  Relative Name and Phone Number:       Current Level of Care: Hospital Recommended Level of Care: [place] Prior Approval Number:    Date Approved/Denied:   PASRR Number:    Discharge Plan: SNF    Current Diagnoses: Patient Active Problem List   Diagnosis Date Noted  . Closed left hip fracture, initial encounter ([place]) 05/10/2018  . Essential hypertension 03/12/2018  . Cognitive deficit, post-stroke   . Hypertension   . Hypoalbuminemia due to protein-calorie malnutrition ([place])   . Urinary retention   . Seizure prophylaxis   . SAH (subarachnoid hemorrhage) ([place]) 12/26/2017  . Subarachnoid hemorrhage due to ruptured aneurysm ([place]) 11/26/2017    Orientation RESPIRATION BLADDER Height & Weight     Self, Time, Situation, Place  Normal Continent Weight: 153 lb 10.6 oz (69.7 kg) Height:  5' (152.4 cm)  BEHAVIORAL SYMPTOMS/MOOD NEUROLOGICAL BOWEL NUTRITION STATUS      Continent Diet(Regular)  AMBULATORY STATUS COMMUNICATION OF NEEDS Skin   Extensive Assist Verbally Normal                       Personal Care Assistance Level of Assistance  Bathing, Feeding, Dressing Bathing Assistance: Limited assistance Feeding assistance: Independent Dressing Assistance: Maximum assistance     Functional Limitations Info  Sight, Hearing, Speech Sight Info: Adequate Hearing Info: Adequate Speech Info: Adequate    SPECIAL CARE FACTORS FREQUENCY  PT (By licensed PT), OT (By licensed OT)     PT Frequency: 5x/week OT Frequency: 5x/week             Contractures Contractures Info: Not present    Additional Factors Info  Code Status, Allergies, Psychotropic Code Status Info: Fullcode  Allergies Info: Allergies: No Known Allergies           Current Medications (05/11/2018):  This is the current hospital active medication list Current Facility-Administered Medications  Medication Dose Route Frequency Provider Last Rate Last Dose  . 0.9 % NaCl with KCl 20 mEq/ L  infusion   Intravenous Continuous Marchia Bond, MD 75 mL/hr at 05/11/18 1112    . [START ON 05/12/2018] acetaminophen (TYLENOL) tablet 325-650 mg  325-650 mg Oral Q6H PRN Marchia Bond, MD      . acetaminophen (TYLENOL) tablet 500 mg  500 mg Oral Q6H Marchia Bond, MD   500 mg at 05/11/18 1107  . alum & mag hydroxide-simeth (MAALOX/MYLANTA) 200-200-20 MG/5ML suspension 30 mL  30 mL Oral Q4H PRN Marchia Bond, MD      . bethanechol (URECHOLINE) tablet 10 mg  10 mg Oral TID Irene Pap N, DO   10 mg at 05/11/18 0957  . bisacodyl (DULCOLAX) suppository 10 mg  10 mg Rectal Daily PRN Marchia Bond, MD      . docusate sodium (COLACE) capsule 100 mg  100 mg Oral BID Marchia Bond, MD   100 mg at 05/11/18 0957  . enoxaparin (LOVENOX) injection 40 mg  40 mg Subcutaneous Q24H Marchia Bond, MD   40 mg at 05/11/18 0839  . ferrous sulfate tablet 325 mg  325 mg Oral TID Emiliano Dyer, MD   325 mg at 05/11/18 1211  . HYDROcodone-acetaminophen (NORCO) 7.5-325 MG per tablet 1-2 tablet  1-2 tablet Oral Q4H PRN Marchia Bond, MD      . HYDROcodone-acetaminophen (NORCO/VICODIN) 5-325 MG per tablet 1-2 tablet  1-2 tablet Oral Q4H PRN Marchia Bond, MD   2 tablet at 05/11/18 1211  . ketorolac (TORADOL) 15 MG/ML injection 7.5 mg  7.5 mg Intravenous Q6H Marchia Bond, MD   7.5 mg at 05/11/18 1107  . lactated ringers infusion   Intravenous Continuous Kayleen Memos, DO   Stopped at 05/10/18 2045  . lisinopril (PRINIVIL,ZESTRIL) tablet 10 mg  10 mg Oral Daily Marchia Bond, MD   10  mg at 05/11/18 0957  . magnesium citrate solution 1 Bottle  1 Bottle Oral Once PRN Marchia Bond, MD      . menthol-cetylpyridinium (CEPACOL) lozenge 3 mg  1 lozenge Oral PRN Marchia Bond, MD       Or  . phenol (CHLORASEPTIC) mouth spray 1 spray  1 spray Mouth/Throat PRN Marchia Bond, MD      . metoCLOPramide (REGLAN) tablet 5-10 mg  5-10 mg Oral Q8H PRN Marchia Bond, MD       Or  . metoCLOPramide (REGLAN) injection 5-10 mg  5-10 mg Intravenous Q8H PRN Marchia Bond, MD      . morphine 2 MG/ML injection 0.5-1 mg  0.5-1 mg Intravenous Q2H PRN Marchia Bond, MD      . ondansetron [facility]) tablet 4 mg  4 mg Oral Q6H PRN Marchia Bond, MD       Or  . ondansetron [facility]) injection 4 mg  4 mg Intravenous Q6H PRN Marchia Bond, MD      . pantoprazole (PROTONIX) EC tablet 40 mg  40 mg Oral Daily Irene Pap N, DO   40 mg at 05/11/18 0957  . polyethylene glycol (MIRALAX / GLYCOLAX) packet 17 g  17 g Oral Daily PRN Marchia Bond, MD      . saccharomyces boulardii (FLORASTOR) capsule 250 mg  250 mg Oral BID Irene Pap N, DO   250 mg at 05/11/18 0957  . tamsulosin (FLOMAX) capsule 0.8 mg  0.8 mg Oral Daily Irene Pap N, DO   0.8 mg at 05/11/18 1027     Discharge Medications: Please see discharge summary for a list of discharge medications.  Relevant Imaging Results:  Relevant Lab Results:   Additional Information SS#[id number] [place] Pricilla [place], [place]

## 2018-05-12 ENCOUNTER — Encounter (HOSPITAL_COMMUNITY): Payer: Self-pay | Admitting: Orthopedic Surgery

## 2018-05-12 ENCOUNTER — Ambulatory Visit: Payer: 59 | Admitting: Physical Therapy

## 2018-05-12 DIAGNOSIS — M2550 Pain in unspecified joint: Secondary | ICD-10-CM | POA: Diagnosis not present

## 2018-05-12 DIAGNOSIS — L89622 Pressure ulcer of left heel, stage 2: Secondary | ICD-10-CM | POA: Diagnosis not present

## 2018-05-12 DIAGNOSIS — Z7401 Bed confinement status: Secondary | ICD-10-CM | POA: Diagnosis not present

## 2018-05-12 DIAGNOSIS — D649 Anemia, unspecified: Secondary | ICD-10-CM | POA: Diagnosis not present

## 2018-05-12 DIAGNOSIS — S3289XA Fracture of other parts of pelvis, initial encounter for closed fracture: Secondary | ICD-10-CM | POA: Diagnosis not present

## 2018-05-12 DIAGNOSIS — R319 Hematuria, unspecified: Secondary | ICD-10-CM | POA: Diagnosis not present

## 2018-05-12 DIAGNOSIS — N390 Urinary tract infection, site not specified: Secondary | ICD-10-CM | POA: Diagnosis not present

## 2018-05-12 DIAGNOSIS — I609 Nontraumatic subarachnoid hemorrhage, unspecified: Secondary | ICD-10-CM | POA: Diagnosis not present

## 2018-05-12 DIAGNOSIS — S72002A Fracture of unspecified part of neck of left femur, initial encounter for closed fracture: Secondary | ICD-10-CM | POA: Diagnosis not present

## 2018-05-12 DIAGNOSIS — Z96642 Presence of left artificial hip joint: Secondary | ICD-10-CM | POA: Diagnosis not present

## 2018-05-12 DIAGNOSIS — S72002D Fracture of unspecified part of neck of left femur, subsequent encounter for closed fracture with routine healing: Secondary | ICD-10-CM | POA: Diagnosis not present

## 2018-05-12 DIAGNOSIS — W19XXXD Unspecified fall, subsequent encounter: Secondary | ICD-10-CM | POA: Diagnosis not present

## 2018-05-12 DIAGNOSIS — I10 Essential (primary) hypertension: Secondary | ICD-10-CM | POA: Diagnosis not present

## 2018-05-12 DIAGNOSIS — Z79899 Other long term (current) drug therapy: Secondary | ICD-10-CM | POA: Diagnosis not present

## 2018-05-12 LAB — CBC
HCT: 32.2 % — ABNORMAL LOW (ref 36.0–46.0)
Hemoglobin: 10.3 g/dL — ABNORMAL LOW (ref 12.0–15.0)
MCH: 30.1 pg (ref 26.0–34.0)
MCHC: 32 g/dL (ref 30.0–36.0)
MCV: 94.2 fL (ref 80.0–100.0)
Platelets: 225 10*3/uL (ref 150–400)
RBC: 3.42 MIL/uL — ABNORMAL LOW (ref 3.87–5.11)
RDW: 14.3 % (ref 11.5–15.5)
WBC: 11 10*3/uL — ABNORMAL HIGH (ref 4.0–10.5)
nRBC: 0 % (ref 0.0–0.2)

## 2018-05-12 LAB — BASIC METABOLIC PANEL
Anion gap: 6 (ref 5–15)
BUN: 15 mg/dL (ref 8–23)
CALCIUM: 8.3 mg/dL — AB (ref 8.9–10.3)
CO2: 22 mmol/L (ref 22–32)
CREATININE: 0.64 mg/dL (ref 0.44–1.00)
Chloride: 111 mmol/L (ref 98–111)
GFR calc Af Amer: >60 mL/min (ref >60)
GFR calc non Af Amer: >60 mL/min (ref >60)
Glucose, Bld: 101 mg/dL — ABNORMAL HIGH (ref 70–99)
Potassium: 4.2 mmol/L (ref 3.5–5.1)
Sodium: 139 mmol/L (ref 135–145)

## 2018-05-12 MED ORDER — ACETAMINOPHEN 325 MG PO TABS: 325.0000 mg | ORAL_TABLET | Freq: Four times a day (QID) | ORAL | Status: AC | PRN

## 2018-05-12 MED ORDER — POLYETHYLENE GLYCOL 3350 17 G PO PACK: 17.0000 g | PACK | Freq: Every day | ORAL | 0 refills | Status: AC | PRN

## 2018-05-12 NOTE — Progress Notes (Signed)
Physical Therapy Treatment Patient Details Name: Sheryl James MRN: [id number] DOB: Jul 21, 1955 Today's Date: 05/12/2018    History of Present Illness 63 year old female with history of hypertension, spontaneous subarachnoid hemorrhage status post coil embolization 11/2017 with prolonged hospitalization and rehab, GERD who was admitted on 05/10/2018 after mechanical fall and left hip fracture.  Pt s/p left hip hemi-arthroplasty on 05/10/18.    PT Comments    Pt assisted with ambulating however upon attempting to go to bathroom, pt with decreased cognition and increased assist required so recliner brought behind pt (see below for details).  RN notified.  Continue to recommend SNF upon d/c.   Follow Up Recommendations  SNF     Equipment Recommendations  None recommended by PT    Recommendations for Other Services       Precautions / Restrictions Precautions Precautions: Posterior Hip;Fall Precaution Comments: reviewed PWB and posterior hip precautions, provided posterior hip precautions handout Restrictions Weight Bearing Restrictions: Yes LLE Weight Bearing: Partial weight bearing LLE Partial Weight Bearing Percentage or Pounds: 50    Mobility  Bed Mobility Overal bed mobility: Needs Assistance Bed Mobility: Supine to Sit     Supine to sit: Mod assist;HOB elevated     General bed mobility comments: verbal cues for maintaining precautions, assist mostly for lower body and utilized bed pads  Transfers Overall transfer level: Needs assistance Equipment used: Rolling walker (2 wheeled) Transfers: Sit to/from Stand Sit to Stand: Mod assist;+2 safety/equipment         General transfer comment: verbal and tactile cues for UE and LE positioning and maintaining precautions, increased assist to rise and steady today  Ambulation/Gait Ambulation/Gait assistance: Mod assist Gait Distance (Feet): 6 Feet Assistive device: Rolling walker (2 wheeled) Gait Pattern/deviations:  Step-to pattern;Decreased stance time - left;Antalgic;Decreased weight shift to left     General Gait Details: verbal cues for sequence, PWB, step length, RW positioning; started to ambulate to bathroom however pt become more quiet, not following simple commands, and slumping forward so husband brought recliner up behind pt and pt assisted safely to sitting; pt reports only pain, denies dizziness. Vitals by time dynamap obtained: 120/58 mmHg, 90 bpm, SPO2 95% on room air; spouse reports pt's BP usually 140s with meds (RN notified)   Stairs             Wheelchair Mobility    Modified Rankin (Stroke Patients Only)       Balance                                            Cognition Arousal/Alertness: Awake/alert Behavior During Therapy: WFL for tasks assessed/performed                                          Exercises      General Comments        Pertinent Vitals/Pain Pain Assessment: Faces Faces Pain Scale: Hurts little more Pain Location: L hip Pain Descriptors / Indicators: Aching;Sore Pain Intervention(s): Monitored during session;Repositioned;Limited activity within patient's tolerance    Home Living                      Prior Function            PT Goals (current goals  can now be found in the care plan section) Progress towards PT goals: Progressing toward goals    Frequency    Min 3X/week      PT Plan Current plan remains appropriate    Co-evaluation              AM-PAC PT "6 Clicks" Mobility   Outcome Measure  Help needed turning from your back to your side while in a flat bed without using bedrails?: A Lot Help needed moving from lying on your back to sitting on the side of a flat bed without using bedrails?: A Lot Help needed moving to and from a bed to a chair (including a wheelchair)?: A Lot Help needed standing up from a chair using your arms (e.g., wheelchair or bedside chair)?: A  Lot Help needed to walk in hospital room?: A Lot Help needed climbing 3-5 steps with a railing? : Total 6 Click Score: 11    End of Session Equipment Utilized During Treatment: Gait belt Activity Tolerance: Patient limited by fatigue Patient left: in chair;with call bell/phone within reach;with family/visitor present Nurse Communication: Mobility status PT Visit Diagnosis: History of falling (Z91.81);Unsteadiness on feet (R26.81)     Time: 4650-3546 PT Time Calculation (min) (ACUTE ONLY): 25 min  Charges:  $Gait Training: 8-22 mins           Carmelia Bake, PT, [place] Office: [phone] Pager: [phone]   Trena Platt 05/12/2018, 3:03 PM

## 2018-05-12 NOTE — TOC Transition Note (Addendum)
Transition of Care [facility]) - CM/SW Discharge Note   Patient Details  Name: BROOKLYNE RADKE MRN: [id number] Date of Birth: 02/25/1956  Transition of Care [facility]) CM/SW Contact:  Lia Hopping, [place] Phone Number: 05/12/2018, 12:56 PM   Clinical Narrative:    Nurse call report to: [phone] Room 402 PTAR arranged to transport at 3:00pm  Final next level of care: Skilled Nursing Facility Barriers to Discharge: Insurance Authorization   Patient Goals and CMS Choice Patient states their goals for this hospitalization and ongoing recovery are:: Rehab, regain strength.  CMS Medicare.gov Compare Post Acute Care list provided to:: Patient Choice offered to / list presented to : Spouse  Discharge Placement PASRR number recieved: 05/12/18 Existing PASRR number confirmed : 05/12/18          Patient chooses bed at: [facility] Patient to be transferred to facility by: [place] Name of family member notified: Spouse-Phillip Vangorden Patient and family notified of of transfer: 05/12/18  Discharge Plan and Services   Post Acute Care Choice: [place]                    Social Determinants of Health (SDOH) Interventions     Readmission Risk Interventions No flowsheet data found.

## 2018-05-12 NOTE — Progress Notes (Signed)
     Subjective:  Patient reports pain as mild.  Slow mobilization with physical therapy.  Objective:   VITALS:   Vitals:   05/11/18 0956 05/11/18 1416 05/11/18 2202 05/12/18 0610  BP: (!) 134/59 (!) 103/49 (!) 103/49 (!) 126/49  Pulse: 91 80 82 90  Resp: 18 20 16 16   Temp: 97.9 F (36.6 C) 98 F (36.7 C) 98.5 F (36.9 C) 98.5 F (36.9 C)  TempSrc: Oral Oral Oral Oral  SpO2: 91% 95% 93% 93%  Weight:      Height:        Neurologically intact Sensation intact distally Dorsiflexion/Plantar flexion intact Incision: no drainage   Lab Results  Component Value Date   WBC 11.0 (H) 05/12/2018   HGB 10.3 (L) 05/12/2018   HCT 32.2 (L) 05/12/2018   MCV 94.2 05/12/2018   PLT 225 05/12/2018   BMET    Component Value Date/Time   NA 139 05/12/2018 0404   K 4.2 05/12/2018 0404   CL 111 05/12/2018 0404   CO2 22 05/12/2018 0404   GLUCOSE 101 (H) 05/12/2018 0404   BUN 15 05/12/2018 0404   CREATININE 0.64 05/12/2018 0404   CALCIUM 8.3 (L) 05/12/2018 0404   GFRNONAA >60 05/12/2018 0404   GFRAA >60 05/12/2018 0404     Assessment/Plan: 2 Days Post-Op   Active Problems:   Closed left hip fracture, initial encounter ([place])   Advance diet Up with therapy Discharge to SNF 50% weightbearing left lower extremity, Lovenox, hydrocodone, return to clinic with me in 2 weeks.  Anticipated LOS equal to or greater than 2 midnights due to - Age 64 and older with one or more of the following:  - Obesity  - Expected need for hospital services (PT, OT, Nursing) required for safe  discharge  - Anticipated need for postoperative skilled nursing care or inpatient rehab  - Active co-morbidities: Stroke and Anemia  -   -      Sheryl James 05/12/2018, 10:01 AM   Marchia Bond, MD Cell [phone]

## 2018-05-12 NOTE — Discharge Summary (Signed)
Physician Discharge Summary  Sheryl James:[id number] DOB: 09/15/1955 DOA: 05/10/2018  PCP: Patient, No Pcp Per  Admit date: 05/10/2018 Discharge date: 05/12/2018  Admitted From: home Disposition:  SNF  Recommendations for Outpatient Follow-up:  1. Follow up with PCP in 1-2 weeks   Home Health: Not applicable Equipment/Devices: Not applicable  Discharge Condition: Stable CODE STATUS: Full code Diet recommendation: Heart healthy  Brief/Interim Summary: 63 year old female with history of hypertension, spontaneous subarachnoid hemorrhage status post coil embolization 11/2017 with prolonged hospitalization and rehab, GERD who was admitted on 05/10/2018 after mechanical fall and left hip fracture.  She underwent left hemi-arthroplasty 05/10/2018.  Discharge Diagnoses:  Active Problems:   Closed left hip fracture, initial encounter [facility])  Close traumatic left hip fracture:  Status post left hip hemiarthroplasty.  Day 2 postop.   Adequate pain medications.  PT OT to continue work with her. Lovenox for DVT prophylaxis for 30 days Appropriate to transfer to a skilled level of care.  Hypertension:Fairly stable.  Will resume all home medications.  GERD: On PPI.  Continue.  Subarachnoid hemorrhage: Currently stable.  Follows with neurosurgery as outpatient.  Started on Lovenox for DVT prophylaxis.  Discharge Instructions  Discharge Instructions    Diet - low sodium heart healthy   Complete by:  As directed    Discharge instructions   Complete by:  As directed    Partial weight bearing left leg   Increase activity slowly   Complete by:  As directed    As per PT/OT. PWB   Partial weight bearing   Complete by:  As directed    % Body Weight:  50%   Laterality:  left   Extremity:  Lower     Allergies as of 05/12/2018   No Known Allergies     Medication List    STOP taking these medications   bethanechol 10 MG tablet Commonly known as:  URECHOLINE   pantoprazole 40 MG  tablet Commonly known as:  PROTONIX   potassium chloride 10 MEQ tablet Commonly known as:  K-DUR   saccharomyces boulardii 250 MG capsule Commonly known as:  FLORASTOR   tamsulosin 0.4 MG Caps capsule Commonly known as:  FLOMAX     TAKE these medications   acetaminophen 325 MG tablet Commonly known as:  TYLENOL Take 1-2 tablets (325-650 mg total) by mouth every 6 (six) hours as needed for mild pain (pain score 1-3 or temp > 100.5).   enoxaparin 40 MG/0.4ML injection Commonly known as:  Lovenox Inject 0.4 mLs (40 mg total) into the skin daily.   HYDROcodone-acetaminophen 5-325 MG tablet Commonly known as:  Norco Take 1-2 tablets by mouth every 6 (six) hours as needed for moderate pain. MAXIMUM TOTAL ACETAMINOPHEN DOSE IS 4000 MG PER DAY   lisinopril 10 MG tablet Commonly known as:  PRINIVIL,ZESTRIL Take 10 mg by mouth daily.   polyethylene glycol packet Commonly known as:  MIRALAX / GLYCOLAX Take 17 g by mouth daily as needed for mild constipation.            Discharge Care Instructions  (From admission, onward)         Start     Ordered   05/10/18 0000  Partial weight bearing    Question Answer Comment  % Body Weight 50%   Laterality left   Extremity Lower      05/10/18 1837          Contact information for follow-up providers    Marchia Bond, MD.  Schedule an appointment as soon as possible for a visit in 2 weeks.   Specialty:  Orthopedic Surgery Contact information: [address] [place] [address] [phone]            Contact information for after-discharge care    Destination    [facility] Preferred SNF .   Service:  Skilled Nursing Contact information: [place] [phone]                 No Known Allergies  Consultations:  Orthopedics   Procedures/Studies: Dg Chest 1 View  Result Date: 05/10/2018 CLINICAL DATA:  Fall and LEFT  hip fracture. EXAM: CHEST  1 VIEW COMPARISON:  12/06/2017 and prior radiographs FINDINGS: The cardiomediastinal silhouette is unremarkable. Mild chronic peribronchial thickening again noted. There is no evidence of focal airspace disease, pulmonary edema, suspicious pulmonary nodule/mass, pleural effusion, or pneumothorax. No acute bony abnormalities are identified. IMPRESSION: No acute abnormality. Electronically Signed   By: Margarette Canada M.D.   On: 05/10/2018 14:04   Dg Ankle Complete Right  Result Date: 05/10/2018 CLINICAL DATA:  Acute RIGHT ankle pain following fall. Initial encounter. EXAM: RIGHT ANKLE - COMPLETE 3+ VIEW COMPARISON:  None. FINDINGS: No acute fracture, subluxation or dislocation. LATERAL soft tissue swelling is present. The joint space is unremarkable. No focal bony lesions are present. IMPRESSION: Soft tissue swelling without acute bony abnormality. Electronically Signed   By: Margarette Canada M.D.   On: 05/10/2018 14:06   Pelvis Portable  Result Date: 05/10/2018 CLINICAL DATA:  Status post left hip replacement. EXAM: PORTABLE PELVIS 1-2 VIEWS COMPARISON:  Abdomen and pelvis CT dated 08/29/2007. FINDINGS: Interval left hip bipolar prosthesis. No fracture or dislocation. IMPRESSION: Satisfactory postoperative appearance of a left hip bipolar prosthesis. Electronically Signed   By: Claudie Revering M.D.   On: 05/10/2018 19:34   Dg Hip Unilat With Pelvis 2-3 Views Left  Result Date: 05/10/2018 CLINICAL DATA:  Acute LEFT hip pain following fall. Initial encounter. EXAM: DG HIP (WITH OR WITHOUT PELVIS) 2-3V LEFT COMPARISON:  None. FINDINGS: A LEFT femoral neck fracture is identified with varus angulation. No dislocation. No definite focal bony lesions are present. IMPRESSION: LEFT femoral neck fracture with varus angulation. Electronically Signed   By: Margarette Canada M.D.   On: 05/10/2018 14:03       Subjective: Patient seen and examined on the day of discharge.  She has some pain on the left  hip but it is manageable.  She took 1 dose of Vicodin since yesterday.  Husband was at the bedside and agree with plan for discharge to a skilled level of care.   Discharge Exam: Vitals:   05/11/18 2202 05/12/18 0610  BP: (!) 103/49 (!) 126/49  Pulse: 82 90  Resp: 16 16  Temp: 98.5 F (36.9 C) 98.5 F (36.9 C)  SpO2: 93% 93%   Vitals:   05/11/18 0956 05/11/18 1416 05/11/18 2202 05/12/18 0610  BP: (!) 134/59 (!) 103/49 (!) 103/49 (!) 126/49  Pulse: 91 80 82 90  Resp: 18 20 16 16   Temp: 97.9 F (36.6 C) 98 F (36.7 C) 98.5 F (36.9 C) 98.5 F (36.9 C)  TempSrc: Oral Oral Oral Oral  SpO2: 91% 95% 93% 93%  Weight:      Height:        General: Pt is alert, awake, not in acute distress Cardiovascular: RRR, S1/S2 +, no rubs, no gallops Respiratory: CTA bilaterally, no  wheezing, no rhonchi Abdominal: Soft, NT, ND, bowel sounds + Extremities: no edema, no cyanosis Left lateral hip incision clean and dry.  Minimal swelling as anticipated from surgery.  Distal neurovascular status intact.   The results of significant diagnostics from this hospitalization (including imaging, microbiology, ancillary and laboratory) are listed below for reference.     Microbiology: No results found for this or any previous visit (from the past 240 hour(s)).   Labs: BNP (last 3 results) No results for input(s): BNP in the last 8760 hours. Basic Metabolic Panel: Recent Labs  Lab 05/10/18 1259 05/11/18 0431 05/12/18 0404  NA 137 137 139  K 3.3* 4.8 4.2  CL 107 108 111  CO2 21* 23 22  GLUCOSE 117* 151* 101*  BUN 16 12 15   CREATININE 0.56 0.58 0.64  CALCIUM 8.1* 8.3* 8.3*  MG  --  2.4  --    Liver Function Tests: No results for input(s): AST, ALT, ALKPHOS, BILITOT, PROT, ALBUMIN in the last 168 hours. No results for input(s): LIPASE, AMYLASE in the last 168 hours. No results for input(s): AMMONIA in the last 168 hours. CBC: Recent Labs  Lab 05/10/18 1259 05/11/18 0431  05/12/18 0404  WBC 14.6* 9.4 11.0*  NEUTROABS 12.8*  --   --   HGB 13.4 11.8* 10.3*  HCT 40.5 36.3 32.2*  MCV 90.6 91.9 94.2  PLT 274 260 225   Cardiac Enzymes: No results for input(s): CKTOTAL, CKMB, CKMBINDEX, TROPONINI in the last 168 hours. BNP: Invalid input(s): POCBNP CBG: No results for input(s): GLUCAP in the last 168 hours. D-Dimer No results for input(s): DDIMER in the last 72 hours. Hgb A1c No results for input(s): HGBA1C in the last 72 hours. Lipid Profile No results for input(s): CHOL, HDL, LDLCALC, TRIG, CHOLHDL, LDLDIRECT in the last 72 hours. Thyroid function studies No results for input(s): TSH, T4TOTAL, T3FREE, THYROIDAB in the last 72 hours.  Invalid input(s): FREET3 Anemia work up No results for input(s): VITAMINB12, FOLATE, FERRITIN, TIBC, IRON, RETICCTPCT in the last 72 hours. Urinalysis    Component Value Date/Time   COLORURINE STRAW (A) 12/11/2017 1650   APPEARANCEUR CLEAR 12/11/2017 1650   LABSPEC 1.008 12/11/2017 1650   PHURINE 6.0 12/11/2017 1650   GLUCOSEU NEGATIVE 12/11/2017 1650   HGBUR MODERATE (A) 12/11/2017 1650   BILIRUBINUR NEGATIVE 12/11/2017 1650   KETONESUR NEGATIVE 12/11/2017 1650   PROTEINUR NEGATIVE 12/11/2017 1650   UROBILINOGEN 0.2 08/29/2007 0136   NITRITE POSITIVE (A) 12/11/2017 1650   LEUKOCYTESUR MODERATE (A) 12/11/2017 1650   Sepsis Labs Invalid input(s): PROCALCITONIN,  WBC,  LACTICIDVEN Microbiology No results found for this or any previous visit (from the past 240 hour(s)).   Time coordinating discharge: 25 minutes  SIGNED:   Barb Merino, MD  [facility] 05/12/2018, 12:38 PM Pager [phone]  If 7PM-7AM, please contact night-coverage [email or web address] Password [email or web address]

## 2018-05-12 NOTE — Progress Notes (Signed)
PROGRESS NOTE    Sheryl James  RXV:[id number] DOB: 03-19-1955 DOA: 05/10/2018 PCP: Patient, No Pcp Per    Brief Narrative:  63 year old female with history of hypertension, spontaneous subarachnoid hemorrhage status post coil embolization 11/2017 with prolonged hospitalization and rehab, GERD who was admitted on 05/10/2018 after mechanical fall and left hip fracture.  She underwent left hemi-arthroplasty 05/10/2018.   Assessment & Plan:   Active Problems:   Closed left hip fracture, initial encounter [facility])  Close traumatic left hip fracture:  Status post left hip hemiarthroplasty.  Day 2 postop.  Adequate pain medications.  PT OT to continue work with her. Lovenox for DVT prophylaxis. Waiting for availability of bed at skilled nursing facility.  QTC prolongation: Rule out.  Normal QTC on repeat EKG.  T wave flattening with false reading.  Leukocytosis: Reactive.  No evidence of infection.  Normalized.  Hypertension:Fairly stable.  Will resume all home medications.  GERD: On PPI.  Continue.  Subarachnoid hemorrhage: Currently stable.  Follows with neurosurgery.  Started on Lovenox for DVT prophylaxis.   DVT prophylaxis: Lovenox Code Status: Full code Family Communication: Husband at the bedside Disposition Plan: SNF when bed available.   Consultants:   Orthopedics  Procedures:   Left hip hemiarthroplasty  Antimicrobials:   None   Subjective: Patient was seen and examined at the bedside.  Husband at the bedside.  Denies any other complaints.  She was having some difficulties due to unable to move her left hip.  We repositioned her.  Has used occasional Vicodin.  Objective: Vitals:   05/11/18 0956 05/11/18 1416 05/11/18 2202 05/12/18 0610  BP: (!) 134/59 (!) 103/49 (!) 103/49 (!) 126/49  Pulse: 91 80 82 90  Resp: 18 20 16 16   Temp: 97.9 F (36.6 C) 98 F (36.7 C) 98.5 F (36.9 C) 98.5 F (36.9 C)  TempSrc: Oral Oral Oral Oral  SpO2: 91% 95% 93% 93%   Weight:      Height:        Intake/Output Summary (Last 24 hours) at 05/12/2018 0924 Last data filed at 05/11/2018 1750 Gross per 24 hour  Intake 375.06 ml  Output 275 ml  Net 100.06 ml   Filed Weights   05/10/18 1237 05/10/18 2007  Weight: 68 kg 69.7 kg    Examination:  General exam: Appears calm and comfortable  Respiratory system: Clear to auscultation. Respiratory effort normal. Cardiovascular system: S1 & S2 heard, RRR. No JVD, murmurs, rubs, gallops or clicks. No pedal edema. Gastrointestinal system: Abdomen is nondistended, soft and nontender. No organomegaly or masses felt. Normal bowel sounds heard. Central nervous system: Alert and oriented. No focal neurological deficits. Extremities: Symmetric 5 x 5 power. Skin: No rashes, lesions or ulcers Psychiatry: Judgement and insight appear normal. Mood & affect appropriate.  Left lateral hip surgical incision clean and dry, minimal swelling around the surgical dressing.  Distal neurovascular status intact.   Data Reviewed: I have personally reviewed following labs and imaging studies  CBC: Recent Labs  Lab 05/10/18 1259 05/11/18 0431 05/12/18 0404  WBC 14.6* 9.4 11.0*  NEUTROABS 12.8*  --   --   HGB 13.4 11.8* 10.3*  HCT 40.5 36.3 32.2*  MCV 90.6 91.9 94.2  PLT 274 260 509   Basic Metabolic Panel: Recent Labs  Lab 05/10/18 1259 05/11/18 0431 05/12/18 0404  NA 137 137 139  K 3.3* 4.8 4.2  CL 107 108 111  CO2 21* 23 22  GLUCOSE 117* 151* 101*  BUN 16 12  15  CREATININE 0.56 0.58 0.64  CALCIUM 8.1* 8.3* 8.3*  MG  --  2.4  --    GFR: Estimated Creatinine Clearance: 62.7 mL/min (by C-G formula based on SCr of 0.64 mg/dL). Liver Function Tests: No results for input(s): AST, ALT, ALKPHOS, BILITOT, PROT, ALBUMIN in the last 168 hours. No results for input(s): LIPASE, AMYLASE in the last 168 hours. No results for input(s): AMMONIA in the last 168 hours. Coagulation Profile: Recent Labs  Lab 05/10/18 1259   INR 0.9   Cardiac Enzymes: No results for input(s): CKTOTAL, CKMB, CKMBINDEX, TROPONINI in the last 168 hours. BNP (last 3 results) No results for input(s): PROBNP in the last 8760 hours. HbA1C: No results for input(s): HGBA1C in the last 72 hours. CBG: No results for input(s): GLUCAP in the last 168 hours. Lipid Profile: No results for input(s): CHOL, HDL, LDLCALC, TRIG, CHOLHDL, LDLDIRECT in the last 72 hours. Thyroid Function Tests: No results for input(s): TSH, T4TOTAL, FREET4, T3FREE, THYROIDAB in the last 72 hours. Anemia Panel: No results for input(s): VITAMINB12, FOLATE, FERRITIN, TIBC, IRON, RETICCTPCT in the last 72 hours. Sepsis Labs: No results for input(s): PROCALCITON, LATICACIDVEN in the last 168 hours.  No results found for this or any previous visit (from the past 240 hour(s)).       Radiology Studies: Dg Chest 1 View  Result Date: 05/10/2018 CLINICAL DATA:  Fall and LEFT hip fracture. EXAM: CHEST  1 VIEW COMPARISON:  12/06/2017 and prior radiographs FINDINGS: The cardiomediastinal silhouette is unremarkable. Mild chronic peribronchial thickening again noted. There is no evidence of focal airspace disease, pulmonary edema, suspicious pulmonary nodule/mass, pleural effusion, or pneumothorax. No acute bony abnormalities are identified. IMPRESSION: No acute abnormality. Electronically Signed   By: Margarette Canada M.D.   On: 05/10/2018 14:04   Dg Ankle Complete Right  Result Date: 05/10/2018 CLINICAL DATA:  Acute RIGHT ankle pain following fall. Initial encounter. EXAM: RIGHT ANKLE - COMPLETE 3+ VIEW COMPARISON:  None. FINDINGS: No acute fracture, subluxation or dislocation. LATERAL soft tissue swelling is present. The joint space is unremarkable. No focal bony lesions are present. IMPRESSION: Soft tissue swelling without acute bony abnormality. Electronically Signed   By: Margarette Canada M.D.   On: 05/10/2018 14:06   Pelvis Portable  Result Date: 05/10/2018 CLINICAL DATA:   Status post left hip replacement. EXAM: PORTABLE PELVIS 1-2 VIEWS COMPARISON:  Abdomen and pelvis CT dated 08/29/2007. FINDINGS: Interval left hip bipolar prosthesis. No fracture or dislocation. IMPRESSION: Satisfactory postoperative appearance of a left hip bipolar prosthesis. Electronically Signed   By: Claudie Revering M.D.   On: 05/10/2018 19:34   Dg Hip Unilat With Pelvis 2-3 Views Left  Result Date: 05/10/2018 CLINICAL DATA:  Acute LEFT hip pain following fall. Initial encounter. EXAM: DG HIP (WITH OR WITHOUT PELVIS) 2-3V LEFT COMPARISON:  None. FINDINGS: A LEFT femoral neck fracture is identified with varus angulation. No dislocation. No definite focal bony lesions are present. IMPRESSION: LEFT femoral neck fracture with varus angulation. Electronically Signed   By: Margarette Canada M.D.   On: 05/10/2018 14:03        Scheduled Meds: . bethanechol  10 mg Oral TID  . docusate sodium  100 mg Oral BID  . enoxaparin (LOVENOX) injection  40 mg Subcutaneous Q24H  . ferrous sulfate  325 mg Oral TID PC  . lisinopril  10 mg Oral Daily  . pantoprazole  40 mg Oral Daily  . saccharomyces boulardii  250 mg Oral BID  .  tamsulosin  0.8 mg Oral Daily   Continuous Infusions: . 0.9 % NaCl with KCl 20 mEq / L Stopped (05/12/18 0021)  . lactated ringers Stopped (05/10/18 2045)     LOS: 2 days    Time spent: 25 minutes    Barb Merino, MD [facility] Pager [phone]  If 7PM-7AM, please contact night-coverage [email or web address] Password [facility] 05/12/2018, 9:24 AM

## 2018-05-12 NOTE — Progress Notes (Signed)
Assumed care of pt at 1500. Agree with previous nursing assessment. Pt was discharged at 1525.  Discharge papers were given to PTAR and explained to pt and family. Pt had no questions or concerns. Pt was taken out via stretcher by PTAR.

## 2018-05-14 DIAGNOSIS — I10 Essential (primary) hypertension: Secondary | ICD-10-CM | POA: Diagnosis not present

## 2018-05-14 DIAGNOSIS — I609 Nontraumatic subarachnoid hemorrhage, unspecified: Secondary | ICD-10-CM | POA: Diagnosis not present

## 2018-05-14 DIAGNOSIS — S72002A Fracture of unspecified part of neck of left femur, initial encounter for closed fracture: Secondary | ICD-10-CM | POA: Diagnosis not present

## 2018-05-15 ENCOUNTER — Ambulatory Visit: Payer: 59 | Admitting: Physical Therapy

## 2018-05-18 NOTE — Anesthesia Postprocedure Evaluation (Signed)
Anesthesia Post Note  Patient: Sheryl James  Procedure(s) Performed: ARTHROPLASTY BIPOLAR HIP (HEMIARTHROPLASTY) (Left Hip)     Patient location during evaluation: PACU Anesthesia Type: General Level of consciousness: awake and alert Pain management: pain level controlled Vital Signs Assessment: post-procedure vital signs reviewed and stable Respiratory status: spontaneous breathing, nonlabored ventilation, respiratory function stable and patient connected to nasal cannula oxygen Cardiovascular status: blood pressure returned to baseline and stable Postop Assessment: no apparent nausea or vomiting Anesthetic complications: no    Last Vitals:  Vitals:   05/11/18 2202 05/12/18 0610  BP: (!) 103/49 (!) 126/49  Pulse: 82 90  Resp: 16 16  Temp: 36.9 C 36.9 C  SpO2: 93% 93%    Last Pain:  Vitals:   05/12/18 0837  TempSrc:   PainSc: [place]

## 2018-05-19 DIAGNOSIS — L89622 Pressure ulcer of left heel, stage 2: Secondary | ICD-10-CM | POA: Diagnosis not present

## 2018-05-20 ENCOUNTER — Ambulatory Visit: Payer: 59 | Admitting: Physical Therapy

## 2018-05-25 DIAGNOSIS — S72002D Fracture of unspecified part of neck of left femur, subsequent encounter for closed fracture with routine healing: Secondary | ICD-10-CM | POA: Diagnosis not present

## 2018-05-26 ENCOUNTER — Ambulatory Visit: Payer: 59 | Admitting: Physical Therapy

## 2018-05-26 DIAGNOSIS — L89622 Pressure ulcer of left heel, stage 2: Secondary | ICD-10-CM | POA: Diagnosis not present

## 2018-05-29 ENCOUNTER — Ambulatory Visit: Payer: 59 | Admitting: Physical Therapy

## 2018-06-02 DIAGNOSIS — L89622 Pressure ulcer of left heel, stage 2: Secondary | ICD-10-CM | POA: Diagnosis not present

## 2018-06-08 ENCOUNTER — Ambulatory Visit (HOSPITAL_COMMUNITY)
Admission: RE | Admit: 2018-06-08 | Discharge: 2018-06-08 | Disposition: A | Discharge status: Home / Self Care | Payer: 59 | Source: Ambulatory Visit | Attending: Family | Admitting: Family

## 2018-06-08 ENCOUNTER — Other Ambulatory Visit (HOSPITAL_COMMUNITY): Payer: Self-pay | Admitting: Orthopedic Surgery

## 2018-06-08 ENCOUNTER — Telehealth (HOSPITAL_COMMUNITY): Payer: Self-pay | Admitting: *Deleted

## 2018-06-08 ENCOUNTER — Other Ambulatory Visit: Payer: Self-pay

## 2018-06-08 DIAGNOSIS — R0989 Other specified symptoms and signs involving the circulatory and respiratory systems: Secondary | ICD-10-CM | POA: Diagnosis present

## 2018-06-08 DIAGNOSIS — I75023 Atheroembolism of bilateral lower extremities: Secondary | ICD-10-CM | POA: Diagnosis not present

## 2018-06-08 DIAGNOSIS — M79605 Pain in left leg: Principal | ICD-10-CM

## 2018-06-08 DIAGNOSIS — M79604 Pain in right leg: Secondary | ICD-10-CM | POA: Diagnosis not present

## 2018-06-08 DIAGNOSIS — L89626 Pressure-induced deep tissue damage of left heel: Secondary | ICD-10-CM | POA: Diagnosis not present

## 2018-06-08 DIAGNOSIS — S72002D Fracture of unspecified part of neck of left femur, subsequent encounter for closed fracture with routine healing: Secondary | ICD-10-CM | POA: Diagnosis not present

## 2018-06-08 DIAGNOSIS — W1839XD Other fall on same level, subsequent encounter: Secondary | ICD-10-CM | POA: Diagnosis not present

## 2018-06-08 NOTE — Telephone Encounter (Signed)
Attempted to call prelim report; left on hold for over 20 minutes. Let patient go with instructions to f/u with her provider

## 2018-06-09 DIAGNOSIS — W1839XD Other fall on same level, subsequent encounter: Secondary | ICD-10-CM | POA: Diagnosis not present

## 2018-06-09 DIAGNOSIS — L89626 Pressure-induced deep tissue damage of left heel: Secondary | ICD-10-CM | POA: Diagnosis not present

## 2018-06-09 DIAGNOSIS — S72002D Fracture of unspecified part of neck of left femur, subsequent encounter for closed fracture with routine healing: Secondary | ICD-10-CM | POA: Diagnosis not present

## 2018-06-10 DIAGNOSIS — W1839XD Other fall on same level, subsequent encounter: Secondary | ICD-10-CM | POA: Diagnosis not present

## 2018-06-10 DIAGNOSIS — L89626 Pressure-induced deep tissue damage of left heel: Secondary | ICD-10-CM | POA: Diagnosis not present

## 2018-06-10 DIAGNOSIS — S72002D Fracture of unspecified part of neck of left femur, subsequent encounter for closed fracture with routine healing: Secondary | ICD-10-CM | POA: Diagnosis not present

## 2018-06-11 ENCOUNTER — Ambulatory Visit: Payer: 59 | Admitting: Physical Medicine & Rehabilitation

## 2018-06-12 ENCOUNTER — Encounter: Payer: 59 | Attending: Physical Medicine & Rehabilitation | Admitting: Physical Medicine & Rehabilitation

## 2018-06-12 ENCOUNTER — Other Ambulatory Visit: Payer: Self-pay

## 2018-06-12 ENCOUNTER — Encounter: Payer: Self-pay | Admitting: Physical Medicine & Rehabilitation

## 2018-06-12 DIAGNOSIS — Z720 Tobacco use: Secondary | ICD-10-CM | POA: Diagnosis not present

## 2018-06-12 DIAGNOSIS — L89626 Pressure-induced deep tissue damage of left heel: Secondary | ICD-10-CM | POA: Diagnosis not present

## 2018-06-12 DIAGNOSIS — R269 Unspecified abnormalities of gait and mobility: Secondary | ICD-10-CM | POA: Diagnosis not present

## 2018-06-12 DIAGNOSIS — I10 Essential (primary) hypertension: Secondary | ICD-10-CM | POA: Insufficient documentation

## 2018-06-12 DIAGNOSIS — Z8781 Personal history of (healed) traumatic fracture: Secondary | ICD-10-CM

## 2018-06-12 DIAGNOSIS — S72002D Fracture of unspecified part of neck of left femur, subsequent encounter for closed fracture with routine healing: Secondary | ICD-10-CM | POA: Diagnosis not present

## 2018-06-12 DIAGNOSIS — R4189 Other symptoms and signs involving cognitive functions and awareness: Secondary | ICD-10-CM | POA: Insufficient documentation

## 2018-06-12 DIAGNOSIS — I609 Nontraumatic subarachnoid hemorrhage, unspecified: Secondary | ICD-10-CM | POA: Insufficient documentation

## 2018-06-12 DIAGNOSIS — R339 Retention of urine, unspecified: Secondary | ICD-10-CM | POA: Insufficient documentation

## 2018-06-12 DIAGNOSIS — W1839XD Other fall on same level, subsequent encounter: Secondary | ICD-10-CM | POA: Diagnosis not present

## 2018-06-12 DIAGNOSIS — F17210 Nicotine dependence, cigarettes, uncomplicated: Secondary | ICD-10-CM | POA: Insufficient documentation

## 2018-06-12 NOTE — Progress Notes (Signed)
Subjective:    Patient ID: Sheryl James, female    DOB: 04/18/1955, 63 y.o.   MRN: [id number]  TELEHEALTH NOTE  Due to national recommendations of social distancing due to COVID 19, an audio/video telehealth visit is felt to be most appropriate for this patient at this time.  See Chart message from today for the patient's consent to telehealth from [place].     I verified that I am speaking with the correct person using two identifiers.  Location of patient: Home Location of provider: Office Method of communication: Telephone Names of participants : Zorita Pang scheduling, Wenda Overland obtaining consent and vitals if available Established patient Time spent on call: 11 minutes  HPI  63 year old right-handed female with history of tobacco abuse presents for follow up for bilateral SAH status post aneurysm coiling, followed by left hip fracture status post hemiarthroplasty on 05/10/2018.  Last clinic 03/12/2018.  Husband supplements history. Since last visit, patient was admitted to the hospital after left hip fracture status post hemiarthroplasty on 05/10/2018.  Notes reviewed. She states her foot was asleep and she tried to get up.  She is in [facility]. She has not seen Neurosurg due to hip fracture and has not rescheduled. She did not follow up with Neurology. She is not smoking. BP has been relatively controller pt patient. Uses walker for ambulation.  Denies falls.   Pain Inventory Average Pain 1 Pain Right Now 0 My pain is intermittent, dull and aching  In the last 24 hours, has pain interfered with the following? General activity 0 Relation with others 0 Enjoyment of life 0 What TIME of day is your pain at its worst? morning Sleep (in general) Good  Pain is worse with: standing Pain improves with: walking Relief from Meds: no meds  Mobility walk with assistance use a walker  Function not employed: date last employed Oct 2018 I need  assistance with the following:  household duties and shopping  Neuro/Psych No problems in this area  Prior Studies Any changes since last visit?  yes fell and had a hip replacement 05/10/18. left side  Physicians involved in your care Any changes since last visit?  yes Neurosurgeon Dr. Ashok Pall Orthopedist Marchia Bond   No family history on file. Social History   Socioeconomic History  . Marital status: Married    Spouse name: Not on file  . Number of children: Not on file  . Years of education: Not on file  . Highest education level: Not on file  Occupational History  . Not on file  Social Needs  . Financial resource strain: Not on file  . Food insecurity:    Worry: Not on file    Inability: Not on file  . Transportation needs:    Medical: Not on file    Non-medical: Not on file  Tobacco Use  . Smoking status: Current Every Day Smoker    Packs/day: 0.50    Types: Cigarettes  . Smokeless tobacco: Never Used  Substance and Sexual Activity  . Alcohol use: Never    Frequency: Never  . Drug use: Never  . Sexual activity: Not on file  Lifestyle  . Physical activity:    Days per week: Not on file    Minutes per session: Not on file  . Stress: Not on file  Relationships  . Social connections:    Talks on phone: Not on file    Gets together: Not on file  Attends religious service: Not on file    Active member of club or organization: Not on file    Attends meetings of clubs or organizations: Not on file    Relationship status: Not on file  Other Topics Concern  . Not on file  Social History Narrative  . Not on file   Past Surgical History:  Procedure Laterality Date  . HIP ARTHROPLASTY Left 05/10/2018   Procedure: ARTHROPLASTY BIPOLAR HIP (HEMIARTHROPLASTY);  Surgeon: Marchia Bond, MD;  Location: [facility];  Service: Orthopedics;  Laterality: Left;  . IR ANGIO INTRA EXTRACRAN SEL INTERNAL CAROTID BILAT MOD SED  11/27/2017  . IR ANGIO VERTEBRAL SEL  VERTEBRAL UNI L MOD SED  11/27/2017  . IR ANGIOGRAM FOLLOW UP STUDY  11/27/2017  . IR ANGIOGRAM FOLLOW UP STUDY  11/27/2017  . IR ANGIOGRAM FOLLOW UP STUDY  11/27/2017  . IR ANGIOGRAM FOLLOW UP STUDY  11/27/2017  . IR TRANSCATH/EMBOLIZ  11/27/2017  . IR US GUIDE VASC ACCESS RIGHT  11/27/2017  . RADIOLOGY WITH ANESTHESIA N/A 11/27/2017   Procedure: IR WITH ANESTHESIA;  Surgeon: Consuella Lose, MD;  Location: [facility];  Service: Radiology;  Laterality: N/A;   No past medical history on file. There were no vitals taken for this visit.  Opioid Risk Score:   Fall Risk Score:  `1  Depression screen PHQ 2/9  Depression screen [facility] 2/9 06/12/2018 03/12/2018 01/21/2018  Decreased Interest 0 0 0  Down, Depressed, Hopeless 0 0 0  PHQ - 2 Score 0 0 0    Review of Systems  Constitutional: Positive for unexpected weight change.  HENT: Negative.   Eyes: Negative.   Respiratory: Negative.   Cardiovascular: Negative.   Gastrointestinal: Negative.   Endocrine: Negative.   Genitourinary: Negative.   Musculoskeletal: Positive for gait problem.  Skin: Negative.   Allergic/Immunologic: Negative.   Hematological: Negative.   Psychiatric/Behavioral: Negative.  Confusion: Improved.  All other systems reviewed and are negative.     Objective:   Physical Exam Gen: NAD. Pulm: Effort normal Neuro: Alert and oriented    Assessment & Plan:  63 year old right-handed female with history of tobacco abuse presents for follow up for bilateral SAH status post aneurysm coiling, followed by left hip fracture status post hemiarthroplasty on 05/10/2018.  1. Decreased functional mobility with cognitive deficits secondary to bilateral medial frontal/temporal subarachnoid hemorrhage from aneurysmal bleed. Pt is status post basilar tip aneurysm coiling.  Cont HH therapies  Follow up with Neurosurg, missed appointment, needs to reschedule  2.  ?Seizures  States she does not believe she had a seizure and is not planing  on following up with Neurology  3.  Tobacco abuse.    Cont to abstain  4.  Hypertension             Relatively controlled per pt  5. Gait abnormality  Cont HH therapies  Cont walker for safety prn  6. Left hip fracture s/p hemiarthroplasty on 05/10/18  See #1  Cont Tylenol PRN for pain

## 2018-06-15 DIAGNOSIS — L89626 Pressure-induced deep tissue damage of left heel: Secondary | ICD-10-CM | POA: Diagnosis not present

## 2018-06-15 DIAGNOSIS — W1839XD Other fall on same level, subsequent encounter: Secondary | ICD-10-CM | POA: Diagnosis not present

## 2018-06-15 DIAGNOSIS — S72002D Fracture of unspecified part of neck of left femur, subsequent encounter for closed fracture with routine healing: Secondary | ICD-10-CM | POA: Diagnosis not present

## 2018-06-16 DIAGNOSIS — S72002D Fracture of unspecified part of neck of left femur, subsequent encounter for closed fracture with routine healing: Secondary | ICD-10-CM | POA: Diagnosis not present

## 2018-06-17 ENCOUNTER — Other Ambulatory Visit: Payer: Self-pay

## 2018-06-17 ENCOUNTER — Encounter: Payer: Self-pay | Admitting: Vascular Surgery

## 2018-06-17 ENCOUNTER — Ambulatory Visit (HOSPITAL_COMMUNITY)
Admission: RE | Admit: 2018-06-17 | Discharge: 2018-06-17 | Disposition: A | Discharge status: Home / Self Care | Payer: 59 | Source: Ambulatory Visit | Attending: Vascular Surgery | Admitting: Vascular Surgery

## 2018-06-17 ENCOUNTER — Encounter: Payer: 59 | Admitting: Vascular Surgery

## 2018-06-17 ENCOUNTER — Other Ambulatory Visit (HOSPITAL_COMMUNITY): Payer: Self-pay | Admitting: Vascular Surgery

## 2018-06-17 ENCOUNTER — Ambulatory Visit (INDEPENDENT_AMBULATORY_CARE_PROVIDER_SITE_OTHER): Payer: 59 | Admitting: Vascular Surgery

## 2018-06-17 VITALS — BP 152/68 | HR 69 | Temp 97.1°F | Resp 18 | Ht 60.0 in | Wt 151.0 lb

## 2018-06-17 DIAGNOSIS — I739 Peripheral vascular disease, unspecified: Secondary | ICD-10-CM | POA: Diagnosis not present

## 2018-06-17 DIAGNOSIS — S72002D Fracture of unspecified part of neck of left femur, subsequent encounter for closed fracture with routine healing: Secondary | ICD-10-CM | POA: Diagnosis not present

## 2018-06-17 DIAGNOSIS — R0989 Other specified symptoms and signs involving the circulatory and respiratory systems: Secondary | ICD-10-CM

## 2018-06-17 DIAGNOSIS — L89626 Pressure-induced deep tissue damage of left heel: Secondary | ICD-10-CM | POA: Diagnosis not present

## 2018-06-17 DIAGNOSIS — I743 Embolism and thrombosis of arteries of the lower extremities: Secondary | ICD-10-CM

## 2018-06-17 DIAGNOSIS — W1839XD Other fall on same level, subsequent encounter: Secondary | ICD-10-CM | POA: Diagnosis not present

## 2018-06-17 NOTE — Progress Notes (Signed)
REASON FOR CONSULT:    Peripheral vascular disease.  The consult is requested by Dr. Mardelle Matte.  ASSESSMENT & PLAN:   INFRAINGUINAL ARTERIAL OCCLUSIVE DISEASE: Based on the duplex scan that was done a few weeks ago the patient does have evidence of infrainguinal arterial occlusive disease bilaterally.  However she is essentially asymptomatic.  She denies any history of claudication, rest pain, or nonhealing ulcers.  She tells me that her activity was not limited by her hip so I cannot attribute her lack of symptoms to this.  I would not recommend an aggressive approach to this unless she developed disabling claudication, rest pain, or nonhealing ulcer.  Fortunately she quit smoking in October 2019.  I encouraged her to stay as active as possible.  Given her blue toes I have recommended a CT angiogram however to rule out proximal disease.  BLUE TOES: The patient does have significant bluish discoloration of the toes on both feet and certainly atheroembolic disease would be on the differential diagnosis.  She does have disease in both superficial femoral arteries which could potentially be a source of embolization however she has diminished femoral pulses and I have recommended that we proceed with a CT angios with runoff to evaluate for a possible embolic source for embolization.  If in fact she does have aortoiliac disease and minimal to angioplasty and stenting I think this should be addressed given the risk for continued embolization and limb loss.  For this reason I have ordered the CT angiogram as ASAP.   BILATERAL CAROTID BRUITS: She did have bilateral carotid bruits today so we did obtain a carotid duplex scan.  This shows a less than 39% carotid stenosis bilaterally.  There is disease in both external carotid arteries which likely explains her bruits.  I explained to her that these are not clinically significant.  She will not need routine carotid duplex testing based on these results.  BILATERAL  LOWER EXTREMITY SWELLING: Patient has moderate swelling bilaterally he tells me that she has been sitting around a lot with her legs hanging down.  We have discussed the importance of leg elevation.  Given that she has some underlying peripheral vascular disease I have encouraged her to try simply elevating her legs on a couple of pillows to start to see if she has any rest pain.  Deitra Mayo, MD, FACS Beeper [phone] Office: [phone]   HPI:   Sheryl James is a pleasant 63 y.o. female, who is referred with some discoloration of the toes of both feet.  She had absent pedal pulses and was suspected of having peripheral vascular disease.  She underwent a left hip replacement recently and has done well from that standpoint.  In addition the patient had a brain aneurysm that required coiling in October 2019.  She has been doing therapy after that and has done well since then.  She had been a 1 pack/day smoker but quit smoking after this event.  Prior to her hip replacement she denies any history of claudication.  She states that her activity was not limited by her hip so I cannot attribute any lack of symptoms to this.  She denies any history of rest pain or nonhealing ulcers.  She did develop a blister on her left heel.  Her risk factors for peripheral vascular disease include hypertension and tobacco use.  She denies any history of diabetes, hypercholesterolemia, or family history of premature cardiovascular disease.  She denies any history of stroke, TIAs, expressive or  receptive aphasia, or amaurosis fugax.  Past Medical History:  Diagnosis Date  . Hypertension   . Stroke [facility])     History reviewed. No pertinent family history.  SOCIAL HISTORY: Social History   Socioeconomic History  . Marital status: Married    Spouse name: Not on file  . Number of children: Not on file  . Years of education: Not on file  . Highest education level: Not on file  Occupational History  . Not  on file  Social Needs  . Financial resource strain: Not on file  . Food insecurity:    Worry: Not on file    Inability: Not on file  . Transportation needs:    Medical: Not on file    Non-medical: Not on file  Tobacco Use  . Smoking status: Former Smoker    Packs/day: 0.50    Types: Cigarettes  . Smokeless tobacco: Never Used  Substance and Sexual Activity  . Alcohol use: Never    Frequency: Never  . Drug use: Never  . Sexual activity: Not on file  Lifestyle  . Physical activity:    Days per week: Not on file    Minutes per session: Not on file  . Stress: Not on file  Relationships  . Social connections:    Talks on phone: Not on file    Gets together: Not on file    Attends religious service: Not on file    Active member of club or organization: Not on file    Attends meetings of clubs or organizations: Not on file    Relationship status: Not on file  . Intimate partner violence:    Fear of current or ex partner: Not on file    Emotionally abused: Not on file    Physically abused: Not on file    Forced sexual activity: Not on file  Other Topics Concern  . Not on file  Social History Narrative  . Not on file    No Known Allergies  Current Outpatient Medications  Medication Sig Dispense Refill  . acetaminophen (TYLENOL) 325 MG tablet Take 1-2 tablets (325-650 mg total) by mouth every 6 (six) hours as needed for mild pain (pain score 1-3 or temp > 100.5).    Marland Kitchen lisinopril (PRINIVIL,ZESTRIL) 10 MG tablet Take 10 mg by mouth daily.    . polyethylene glycol (MIRALAX / GLYCOLAX) packet Take 17 g by mouth daily as needed for mild constipation. 14 each 0   No current facility-administered medications for this visit.     REVIEW OF SYSTEMS:  [X]  denotes positive finding, [ ]  denotes negative finding Cardiac  Comments:  Chest pain or chest pressure:    Shortness of breath upon exertion:    Short of breath when lying flat:    Irregular heart rhythm:        Vascular     Pain in calf, thigh, or hip brought on by ambulation:    Pain in feet at night that wakes you up from your sleep:     Blood clot in your veins:    Leg swelling:  x       Pulmonary    Oxygen at home:    Productive cough:     Wheezing:         Neurologic    Sudden weakness in arms or legs:     Sudden numbness in arms or legs:     Sudden onset of difficulty speaking or slurred speech:  Temporary loss of vision in one eye:     Problems with dizziness:         Gastrointestinal    Blood in stool:     Vomited blood:         Genitourinary    Burning when urinating:     Blood in urine:        Psychiatric    Major depression:         Hematologic    Bleeding problems:    Problems with blood clotting too easily:        Skin    Rashes or ulcers:        Constitutional    Fever or chills:     PHYSICAL EXAM:   Vitals:   06/17/18 0957  BP: (!) 152/68  Pulse: 69  Resp: 18  Temp: (!) 97.1 F (36.2 C)  SpO2: 96%  Weight: 151 lb (68.5 kg)  Height: 5' (1.524 m)    GENERAL: The patient is a well-nourished female, in no acute distress. The vital signs are documented above. CARDIAC: There is a regular rate and rhythm.  VASCULAR: She has bilateral carotid bruits. On the right side she has a slightly diminished femoral pulse.  I cannot palpate popliteal or pedal pulses.  She has brisk but monophasic Doppler signals in the dorsalis pedis and posterior tibial positions. On the left side she has a diminished but palpable femoral pulse.  I cannot palpate pedal pulses.  She has a dampened monophasic dorsalis pedis and posterior tibial signal on the left. She has bilateral lower extremity swelling mostly limited to her feet. She has bluish discoloration of the toes of both feet which did not go away with elevation of her legs. PULMONARY: There is good air exchange bilaterally without wheezing or rales. ABDOMEN: Soft and non-tender with normal pitched bowel sounds.  MUSCULOSKELETAL:  There are no major deformities or cyanosis. NEUROLOGIC: No focal weakness or paresthesias are detected. SKIN: There are no ulcers or rashes noted. PSYCHIATRIC: The patient has a normal affect.  DATA:    ARTERIAL DUPLEX: I have reviewed the arterial duplex scan that was done on 06/08/2018.  On the right side, there are elevated velocities but biphasic flow throughout the common femoral, and superficial femoral arteries.  There is monophasic flow from the distal superficial femoral artery through the tibial vessels.  On the left side there is elevated velocities and but biphasic flow throughout the common femoral and superficial femoral artery.  The superficial femoral artery in the left is occluded distally with monophasic flow below that.  Of note ABIs were not obtained.  CAROTID DUPLEX: Given her bilateral carotid bruits I did obtain a carotid duplex scan today.  I have independently interpreted the study.  On the right side there is a less than 39% stenosis.  There is a stenosis in the external carotid artery which likely explains her bruit.  On the left side there is a less than 39% stenosis.  There are elevated velocities in the external carotid artery also again which likely explains her bruit.  Both vertebral arteries are patent with antegrade flow.

## 2018-06-18 DIAGNOSIS — S72002D Fracture of unspecified part of neck of left femur, subsequent encounter for closed fracture with routine healing: Secondary | ICD-10-CM | POA: Diagnosis not present

## 2018-06-18 DIAGNOSIS — L89626 Pressure-induced deep tissue damage of left heel: Secondary | ICD-10-CM | POA: Diagnosis not present

## 2018-06-18 DIAGNOSIS — W1839XD Other fall on same level, subsequent encounter: Secondary | ICD-10-CM | POA: Diagnosis not present

## 2018-06-20 DIAGNOSIS — L89626 Pressure-induced deep tissue damage of left heel: Secondary | ICD-10-CM | POA: Diagnosis not present

## 2018-06-20 DIAGNOSIS — S72002D Fracture of unspecified part of neck of left femur, subsequent encounter for closed fracture with routine healing: Secondary | ICD-10-CM | POA: Diagnosis not present

## 2018-06-20 DIAGNOSIS — W1839XD Other fall on same level, subsequent encounter: Secondary | ICD-10-CM | POA: Diagnosis not present

## 2018-06-22 ENCOUNTER — Other Ambulatory Visit: Payer: Self-pay

## 2018-06-22 ENCOUNTER — Ambulatory Visit
Admission: RE | Admit: 2018-06-22 | Discharge: 2018-06-22 | Disposition: A | Discharge status: Home / Self Care | Payer: 59 | Source: Ambulatory Visit | Attending: Vascular Surgery | Admitting: Vascular Surgery

## 2018-06-22 DIAGNOSIS — W1839XD Other fall on same level, subsequent encounter: Secondary | ICD-10-CM | POA: Diagnosis not present

## 2018-06-22 DIAGNOSIS — R600 Localized edema: Secondary | ICD-10-CM | POA: Diagnosis not present

## 2018-06-22 DIAGNOSIS — Z79899 Other long term (current) drug therapy: Secondary | ICD-10-CM | POA: Diagnosis not present

## 2018-06-22 DIAGNOSIS — L89626 Pressure-induced deep tissue damage of left heel: Secondary | ICD-10-CM | POA: Diagnosis not present

## 2018-06-22 DIAGNOSIS — I10 Essential (primary) hypertension: Secondary | ICD-10-CM | POA: Diagnosis not present

## 2018-06-22 DIAGNOSIS — S72002D Fracture of unspecified part of neck of left femur, subsequent encounter for closed fracture with routine healing: Secondary | ICD-10-CM | POA: Diagnosis not present

## 2018-06-22 DIAGNOSIS — I743 Embolism and thrombosis of arteries of the lower extremities: Secondary | ICD-10-CM

## 2018-06-22 MED ORDER — IOPAMIDOL (ISOVUE-370) INJECTION 76%
100.0000 mL | Freq: Once | INTRAVENOUS | Status: AC | PRN
  Administered 2018-06-22: 17:00:00 100 mL via INTRAVENOUS

## 2018-06-24 DIAGNOSIS — S72002D Fracture of unspecified part of neck of left femur, subsequent encounter for closed fracture with routine healing: Secondary | ICD-10-CM | POA: Diagnosis not present

## 2018-06-24 DIAGNOSIS — W1839XD Other fall on same level, subsequent encounter: Secondary | ICD-10-CM | POA: Diagnosis not present

## 2018-06-24 DIAGNOSIS — L89626 Pressure-induced deep tissue damage of left heel: Secondary | ICD-10-CM | POA: Diagnosis not present

## 2018-06-29 DIAGNOSIS — W1839XD Other fall on same level, subsequent encounter: Secondary | ICD-10-CM | POA: Diagnosis not present

## 2018-06-29 DIAGNOSIS — L89626 Pressure-induced deep tissue damage of left heel: Secondary | ICD-10-CM | POA: Diagnosis not present

## 2018-06-29 DIAGNOSIS — S72002D Fracture of unspecified part of neck of left femur, subsequent encounter for closed fracture with routine healing: Secondary | ICD-10-CM | POA: Diagnosis not present

## 2018-06-30 DIAGNOSIS — L89626 Pressure-induced deep tissue damage of left heel: Secondary | ICD-10-CM | POA: Diagnosis not present

## 2018-06-30 DIAGNOSIS — S72002D Fracture of unspecified part of neck of left femur, subsequent encounter for closed fracture with routine healing: Secondary | ICD-10-CM | POA: Diagnosis not present

## 2018-06-30 DIAGNOSIS — W1839XD Other fall on same level, subsequent encounter: Secondary | ICD-10-CM | POA: Diagnosis not present

## 2018-07-01 ENCOUNTER — Ambulatory Visit (INDEPENDENT_AMBULATORY_CARE_PROVIDER_SITE_OTHER): Payer: 59 | Admitting: Vascular Surgery

## 2018-07-01 ENCOUNTER — Other Ambulatory Visit: Payer: Self-pay

## 2018-07-01 ENCOUNTER — Encounter: Payer: Self-pay | Admitting: Vascular Surgery

## 2018-07-01 VITALS — BP 147/72 | HR 73 | Temp 97.4°F | Resp 20 | Ht 60.0 in | Wt 148.6 lb

## 2018-07-01 DIAGNOSIS — I739 Peripheral vascular disease, unspecified: Secondary | ICD-10-CM

## 2018-07-01 NOTE — Progress Notes (Signed)
Patient name: Sheryl James MRN: [id number] DOB: 1955-08-27 Sex: female  REASON FOR VISIT:   To discuss results of CT scan  HPI:   Sheryl James is a pleasant 63 y.o. female who I last saw on 06/17/2018 with peripheral vascular disease.  She had been referred by Dr. Mardelle Matte.  She had evidence of infrainguinal arterial occlusive disease but was essentially asymptomatic.  She had quit smoking in October 2019.  She did have some bluish discoloration to her toes and for this reason I recommended a CT angiogram to rule out a proximal embolic source.  She comes in today to discuss those results.  Since I saw her last she does feel that the discoloration in her toes is improved slightly.  She denies any claudication, rest pain, or nonhealing ulcers.  She quit smoking last year.  Current Outpatient Medications  Medication Sig Dispense Refill  . acetaminophen (TYLENOL) 325 MG tablet Take 1-2 tablets (325-650 mg total) by mouth every 6 (six) hours as needed for mild pain (pain score 1-3 or temp > 100.5).    Marland Kitchen lisinopril-hydrochlorothiazide (ZESTORETIC) 10-12.5 MG tablet TK 1 T PO QD. STOP PLAIN LISINOPRIL 10 MG TABLETS    . polyethylene glycol (MIRALAX / GLYCOLAX) packet Take 17 g by mouth daily as needed for mild constipation. 14 each 0   No current facility-administered medications for this visit.     REVIEW OF SYSTEMS:  [X]  denotes positive finding, [ ]  denotes negative finding Vascular    Leg swelling x  improved  Cardiac    Chest pain or chest pressure:    Shortness of breath upon exertion:    Short of breath when lying flat:    Irregular heart rhythm:    Constitutional    Fever or chills:     PHYSICAL EXAM:   Vitals:   07/01/18 0859  BP: (!) 147/72  Pulse: 73  Resp: 20  Temp: (!) 97.4 F (36.3 C)  SpO2: 99%  Weight: 148 lb 9.6 oz (67.4 kg)  Height: 5' (1.524 m)    GENERAL: The patient is a well-nourished female, in no acute distress. The vital signs are documented above.  CARDIOVASCULAR: There is a regular rate and rhythm. PULMONARY: There is good air exchange bilaterally without wheezing or rales. VASCULAR:  Cap on the right side she has a palpable femoral and popliteal pulse.  I cannot palpate pedal pulses. On the left side she has a palpable femoral pulse.  I cannot palpate a popliteal or pedal pulses. The discoloration in her toes improves slightly with leg elevation suggesting some venous hypertension.  DATA:   CT ANGIOGRAM: I have reviewed the images of the CT angiogram.  There is no significant aortoiliac occlusive disease to suggest a proximal embolic source.  On the right side there is mild atherosclerosis involving the superficial femoral artery and popliteal artery.  There is three-vessel runoff on the right.  On the left, there is a short segment occlusion of the superficial femoral artery at the adductor canal with reconstitution of the above-knee popliteal artery.  There is three-vessel runoff on the left.  MEDICAL ISSUES:   PERIPHERAL VASCULAR DISEASE: Her CT angiogram does not suggest any evidence of a proximal embolic source.  She does have a short segment occlusion of her left superficial femoral artery but has minimal symptoms.  I would not consider arteriography and possible intervention unless she developed disabling claudication, rest pain, or nonhealing ulcer.  I discussed with her the importance  of exercise which helps with development of collateral flow.  Fortunately she quit smoking last year.  In addition we discussed nutrition.  I plan on seeing her back in 1 year and have ordered follow-up ABIs at that time.  She knows to call sooner if she has problems.  Deitra Mayo [facility] [phone]

## 2018-07-06 DIAGNOSIS — Z79899 Other long term (current) drug therapy: Secondary | ICD-10-CM | POA: Diagnosis not present

## 2018-07-06 DIAGNOSIS — S72002D Fracture of unspecified part of neck of left femur, subsequent encounter for closed fracture with routine healing: Secondary | ICD-10-CM | POA: Diagnosis not present

## 2018-07-09 DIAGNOSIS — W1839XD Other fall on same level, subsequent encounter: Secondary | ICD-10-CM | POA: Diagnosis not present

## 2018-07-09 DIAGNOSIS — L89626 Pressure-induced deep tissue damage of left heel: Secondary | ICD-10-CM | POA: Diagnosis not present

## 2018-07-09 DIAGNOSIS — S72002D Fracture of unspecified part of neck of left femur, subsequent encounter for closed fracture with routine healing: Secondary | ICD-10-CM | POA: Diagnosis not present

## 2018-07-14 DIAGNOSIS — L89626 Pressure-induced deep tissue damage of left heel: Secondary | ICD-10-CM | POA: Diagnosis not present

## 2018-07-14 DIAGNOSIS — W1839XD Other fall on same level, subsequent encounter: Secondary | ICD-10-CM | POA: Diagnosis not present

## 2018-07-14 DIAGNOSIS — S72002D Fracture of unspecified part of neck of left femur, subsequent encounter for closed fracture with routine healing: Secondary | ICD-10-CM | POA: Diagnosis not present

## 2018-07-21 DIAGNOSIS — W1839XD Other fall on same level, subsequent encounter: Secondary | ICD-10-CM | POA: Diagnosis not present

## 2018-07-21 DIAGNOSIS — S72002D Fracture of unspecified part of neck of left femur, subsequent encounter for closed fracture with routine healing: Secondary | ICD-10-CM | POA: Diagnosis not present

## 2018-07-21 DIAGNOSIS — L89626 Pressure-induced deep tissue damage of left heel: Secondary | ICD-10-CM | POA: Diagnosis not present

## 2018-08-10 ENCOUNTER — Other Ambulatory Visit: Payer: Self-pay | Admitting: Otolaryngology

## 2018-08-10 DIAGNOSIS — K118 Other diseases of salivary glands: Secondary | ICD-10-CM

## 2018-09-09 ENCOUNTER — Encounter: Payer: Self-pay | Admitting: Physical Medicine & Rehabilitation

## 2018-09-09 ENCOUNTER — Other Ambulatory Visit: Payer: Self-pay

## 2018-09-09 ENCOUNTER — Encounter: Payer: 59 | Attending: Physical Medicine & Rehabilitation | Admitting: Physical Medicine & Rehabilitation

## 2018-09-09 VITALS — BP 165/89 | HR 71 | Temp 97.8°F | Ht 60.0 in | Wt 151.0 lb

## 2018-09-09 DIAGNOSIS — R339 Retention of urine, unspecified: Secondary | ICD-10-CM | POA: Insufficient documentation

## 2018-09-09 DIAGNOSIS — I609 Nontraumatic subarachnoid hemorrhage, unspecified: Secondary | ICD-10-CM

## 2018-09-09 DIAGNOSIS — R4189 Other symptoms and signs involving cognitive functions and awareness: Secondary | ICD-10-CM | POA: Diagnosis not present

## 2018-09-09 DIAGNOSIS — F17210 Nicotine dependence, cigarettes, uncomplicated: Secondary | ICD-10-CM | POA: Diagnosis not present

## 2018-09-09 DIAGNOSIS — Z8781 Personal history of (healed) traumatic fracture: Secondary | ICD-10-CM | POA: Diagnosis not present

## 2018-09-09 DIAGNOSIS — I10 Essential (primary) hypertension: Secondary | ICD-10-CM | POA: Diagnosis not present

## 2018-09-09 DIAGNOSIS — R269 Unspecified abnormalities of gait and mobility: Secondary | ICD-10-CM

## 2018-09-09 DIAGNOSIS — I608 Other nontraumatic subarachnoid hemorrhage: Secondary | ICD-10-CM

## 2018-09-09 NOTE — Progress Notes (Deleted)
Subjective:    Patient ID: Sheryl James, female    DOB: [birth year], 63 y.o.   MRN: [id number]  HPI  Pain Inventory Average Pain 0 Pain Right Now 0 My pain is na  In the last 24 hours, has pain interfered with the following? General activity 0 Relation with others 0 Enjoyment of life 0 What TIME of day is your pain at its worst? na Sleep (in general) Good  Pain is worse with: na Pain improves with: na Relief from Meds: na  Mobility use a cane ability to climb steps?  yes do you drive?  no  Function not employed: date last employed .  Neuro/Psych No problems in this area  Prior Studies Any changes since last visit?  no  Physicians involved in your care Any changes since last visit?  no   No family history on file. Social History   Socioeconomic History  . Marital status: Married    Spouse name: Not on file  . Number of children: Not on file  . Years of education: Not on file  . Highest education level: Not on file  Occupational History  . Not on file  Social Needs  . Financial resource strain: Not on file  . Food insecurity    Worry: Not on file    Inability: Not on file  . Transportation needs    Medical: Not on file    Non-medical: Not on file  Tobacco Use  . Smoking status: Former Smoker    Packs/day: 0.50    Types: Cigarettes  . Smokeless tobacco: Never Used  Substance and Sexual Activity  . Alcohol use: Never    Frequency: Never  . Drug use: Never  . Sexual activity: Not on file  Lifestyle  . Physical activity    Days per week: Not on file    Minutes per session: Not on file  . Stress: Not on file  Relationships  . Social Herbalist on phone: Not on file    Gets together: Not on file    Attends religious service: Not on file    Active member of club or organization: Not on file    Attends meetings of clubs or organizations: Not on file    Relationship status: Not on file  Other Topics Concern  . Not on file  Social  History Narrative  . Not on file   Past Surgical History:  Procedure Laterality Date  . HIP ARTHROPLASTY Left 05/10/2018   Procedure: ARTHROPLASTY BIPOLAR HIP (HEMIARTHROPLASTY);  Surgeon: Marchia Bond, MD;  Location: [facility];  Service: Orthopedics;  Laterality: Left;  . IR ANGIO INTRA EXTRACRAN SEL INTERNAL CAROTID BILAT MOD SED  11/27/2017  . IR ANGIO VERTEBRAL SEL VERTEBRAL UNI L MOD SED  11/27/2017  . IR ANGIOGRAM FOLLOW UP STUDY  11/27/2017  . IR ANGIOGRAM FOLLOW UP STUDY  11/27/2017  . IR ANGIOGRAM FOLLOW UP STUDY  11/27/2017  . IR ANGIOGRAM FOLLOW UP STUDY  11/27/2017  . IR TRANSCATH/EMBOLIZ  11/27/2017  . IR US GUIDE VASC ACCESS RIGHT  11/27/2017  . RADIOLOGY WITH ANESTHESIA N/A 11/27/2017   Procedure: IR WITH ANESTHESIA;  Surgeon: Consuella Lose, MD;  Location: [place];  Service: Radiology;  Laterality: N/A;   Past Medical History:  Diagnosis Date  . Hypertension   . Stroke ([place])    BP (!) 165/89   Pulse 71   Temp 97.8 F (36.6 C)   Ht 5' (1.524 m)   Wt  151 lb (68.5 kg)   SpO2 98%   BMI 29.49 kg/m   Opioid Risk Score:   Fall Risk Score:  `1  Depression screen PHQ 2/9  Depression screen [facility] 2/9 06/12/2018 03/12/2018 01/21/2018  Decreased Interest 0 0 0  Down, Depressed, Hopeless 0 0 0  PHQ - 2 Score 0 0 0     Review of Systems  Constitutional: Negative.   HENT: Negative.   Eyes: Negative.   Respiratory: Negative.   Cardiovascular: Negative.   Gastrointestinal: Negative.   Endocrine: Negative.   Genitourinary: Negative.   Musculoskeletal: Negative.   Skin: Negative.   Allergic/Immunologic: Negative.   Neurological: Negative.   Hematological: Negative.   Psychiatric/Behavioral: Negative.   All other systems reviewed and are negative.      Objective:   Physical Exam        Assessment & Plan:

## 2018-09-09 NOTE — Progress Notes (Signed)
Subjective:    Patient ID: Sheryl James, female    DOB: 09-20-1955, 63 y.o.   MRN: [id number]  HPI  63 year old right-handed female with history of tobacco abuse presents for follow up for bilateral SAH status post aneurysm coiling, followed by left hip fracture status post hemiarthroplasty on 05/10/2018.  Last clinic 06/12/2018.  Husband supplements history. Since that time, she completed therapies.  She never followed up with Neurosurg. She never followed up with Neurology regarding ?seizures. She is not smoking. BP is elevated, but states it is normally better controlled at home. Denies falls. She followed up with Ortho and is doing well.   Pain Inventory Average Pain 0 Pain Right Now 0 My pain is na  In the last 24 hours, has pain interfered with the following? General activity 0 Relation with others 0 Enjoyment of life 0 What TIME of day is your pain at its worst? morning Sleep (in general) Good  Pain is worse with: na Pain improves with: na Relief from Meds: na  Mobility walk with assistance use a cane  Function retired  Neuro/Psych No problems in this area  Prior Studies Any changes since last visit?  no  Physicians involved in your care Any changes since last visit?  no   No family history on file. Social History   Socioeconomic History  . Marital status: Married    Spouse name: Not on file  . Number of children: Not on file  . Years of education: Not on file  . Highest education level: Not on file  Occupational History  . Not on file  Social Needs  . Financial resource strain: Not on file  . Food insecurity    Worry: Not on file    Inability: Not on file  . Transportation needs    Medical: Not on file    Non-medical: Not on file  Tobacco Use  . Smoking status: Former Smoker    Packs/day: 0.50    Types: Cigarettes  . Smokeless tobacco: Never Used  Substance and Sexual Activity  . Alcohol use: Never    Frequency: Never  . Drug use: Never   . Sexual activity: Not on file  Lifestyle  . Physical activity    Days per week: Not on file    Minutes per session: Not on file  . Stress: Not on file  Relationships  . Social Herbalist on phone: Not on file    Gets together: Not on file    Attends religious service: Not on file    Active member of club or organization: Not on file    Attends meetings of clubs or organizations: Not on file    Relationship status: Not on file  Other Topics Concern  . Not on file  Social History Narrative  . Not on file   Past Surgical History:  Procedure Laterality Date  . HIP ARTHROPLASTY Left 05/10/2018   Procedure: ARTHROPLASTY BIPOLAR HIP (HEMIARTHROPLASTY);  Surgeon: Marchia Bond, MD;  Location: [facility];  Service: Orthopedics;  Laterality: Left;  . IR ANGIO INTRA EXTRACRAN SEL INTERNAL CAROTID BILAT MOD SED  11/27/2017  . IR ANGIO VERTEBRAL SEL VERTEBRAL UNI L MOD SED  11/27/2017  . IR ANGIOGRAM FOLLOW UP STUDY  11/27/2017  . IR ANGIOGRAM FOLLOW UP STUDY  11/27/2017  . IR ANGIOGRAM FOLLOW UP STUDY  11/27/2017  . IR ANGIOGRAM FOLLOW UP STUDY  11/27/2017  . IR TRANSCATH/EMBOLIZ  11/27/2017  . IR US GUIDE VASC  ACCESS RIGHT  11/27/2017  . RADIOLOGY WITH ANESTHESIA N/A 11/27/2017   Procedure: IR WITH ANESTHESIA;  Surgeon: Consuella Lose, MD;  Location: [place];  Service: Radiology;  Laterality: N/A;   Past Medical History:  Diagnosis Date  . Hypertension   . Stroke ([place])    BP (!) 165/89   Pulse 71   Temp 97.8 F (36.6 C)   Ht 5' (1.524 m)   Wt 151 lb (68.5 kg)   SpO2 98%   BMI 29.49 kg/m   Opioid Risk Score:   Fall Risk Score:  `1  Depression screen PHQ 2/9  Depression screen [facility] 2/9 06/12/2018 03/12/2018 01/21/2018  Decreased Interest 0 0 0  Down, Depressed, Hopeless 0 0 0  PHQ - 2 Score 0 0 0    Review of Systems  Constitutional: Negative.   HENT: Negative.   Eyes: Negative.   Respiratory: Negative.   Cardiovascular: Negative.   Gastrointestinal: Negative.    Endocrine: Negative.   Genitourinary: Negative.   Musculoskeletal: Negative.   Skin: Negative.   Allergic/Immunologic: Negative.   Neurological: Negative.   Hematological: Negative.   Psychiatric/Behavioral: Negative.  Confusion: Improved.  All other systems reviewed and are negative.     Objective:   Physical Exam Constitutional: No distress . Vital signs reviewed. HENT: Normocephalic.  Atraumatic. Eyes: EOMI. No discharge. Cardiovascular: No JVD. Respiratory: Normal effort. GI: Non-distended. Musc: No edema or tenderness in extremities. Neurological: She isalert and oriented x3. Processing delays, improved.  Motor:  RUE/RLE: 5/5 proximal distal LUE 5/5 proximal to distal  LLE: 4+/5 proximal to distal Skin: She isnot diaphoretic. Warm and dry. Psychiatric: Normal mood. Normal affect.     Assessment & Plan:  63 year old right-handed female with history of tobacco abuse presents for follow up for bilateral SAH status post aneurysm coiling, followed by left hip fracture status post hemiarthroplasty on 05/10/2018.  1. Decreased functional mobility with cognitive deficits secondary to bilateral medial frontal/temporal subarachnoid hemorrhage from aneurysmal bleed. Pt is status post basilar tip aneurysm coiling.  Cont HEP  Discussed gradual return to driving  2.  ?Seizures  Never followed up with Neurology, she does not feel she needs to   >6 months ago  3. Hypertension             Elevated today  Relatively controlled at home per patient.   4. Gait abnormality  Cont HEP  Cont cane  5. Left hip fracture s/p hemiarthroplasty on 05/10/18  See #1  Cont Tylenol PRN for pain

## 2019-02-25 ENCOUNTER — Telehealth: Payer: Self-pay

## 2019-02-25 NOTE — Telephone Encounter (Signed)
That is fine.  Glad to hear that she is doing better.  Thanks.

## 2019-02-25 NOTE — Telephone Encounter (Signed)
Patients husband called and left message stating they cancelled there next appointment because of how well she is progressing.  Stated that if you feel this is too rash a move to just call them and let them know to reschedule for a follow up.

## 2019-03-04 ENCOUNTER — Ambulatory Visit: Payer: 59 | Admitting: Physical Medicine & Rehabilitation

## 2019-03-08 ENCOUNTER — Ambulatory Visit: Payer: 59 | Admitting: Physical Medicine & Rehabilitation

## 2019-03-11 ENCOUNTER — Ambulatory Visit: Payer: 59 | Admitting: Physical Medicine & Rehabilitation

## 2019-03-12 ENCOUNTER — Ambulatory Visit: Payer: 59 | Admitting: Physical Medicine & Rehabilitation

## 2019-10-12 IMAGING — CT CT HEAD W/O CM
4 series · 16 of 47 positions shown, 18 images · non-contrast
Comparison: 11/26/2017 CT angiogram head and CT head.

CLINICAL DATA: 62 y/o F; new onset right facial droop and right arm
drift.

EXAM:
CT HEAD WITHOUT CONTRAST
TECHNIQUE: Contiguous axial images were obtained from the base of the skull
through the vertex without intravenous contrast.

[Series 3: head wo · axial · 0.40mm/px · z∈[-116,+4]mm · 7 of 32 slices shown, 9 images]
[im 4/32  brain]
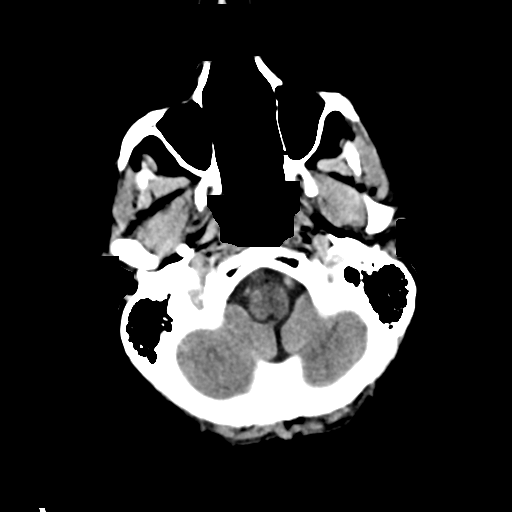
[im 4/32  bone]
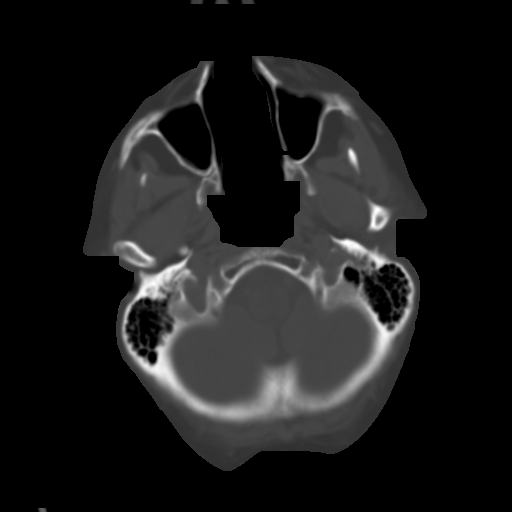
[im 8/32  brain]
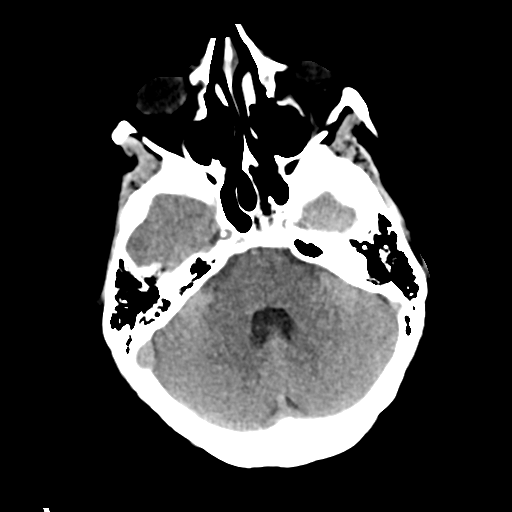
[im 12/32  brain]
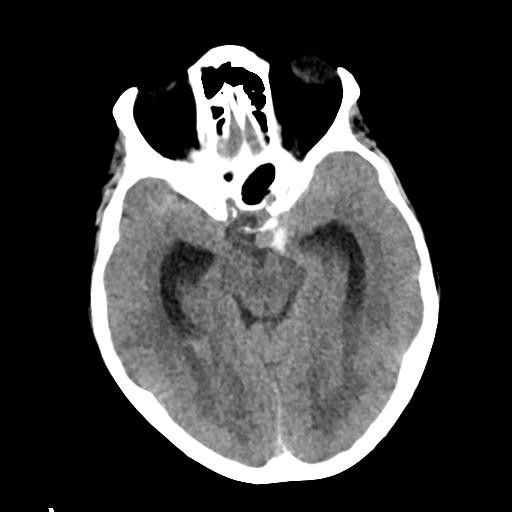
[im 16/32  brain]
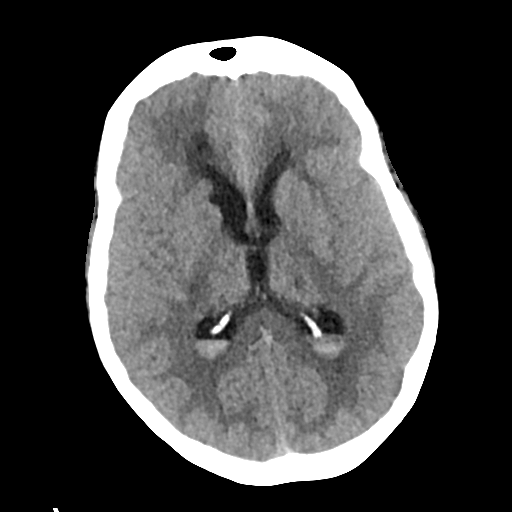
[im 20/32  brain]
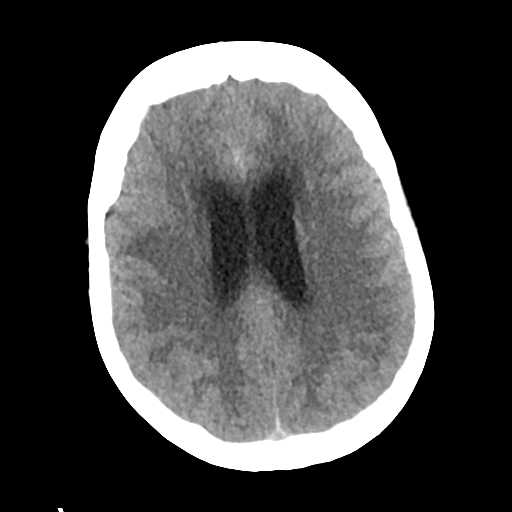
[im 20/32  bone]
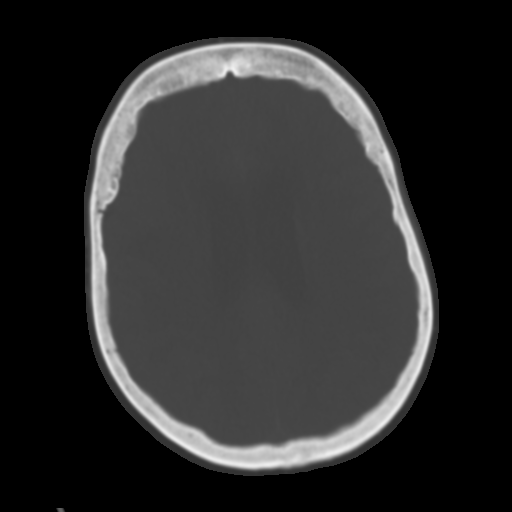
[im 24/32  brain]
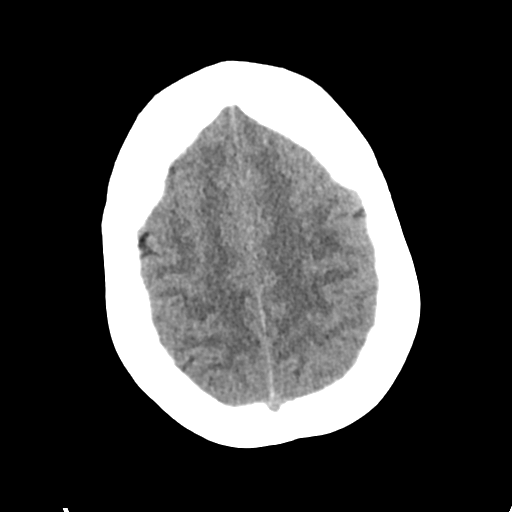
[im 28/32  brain]
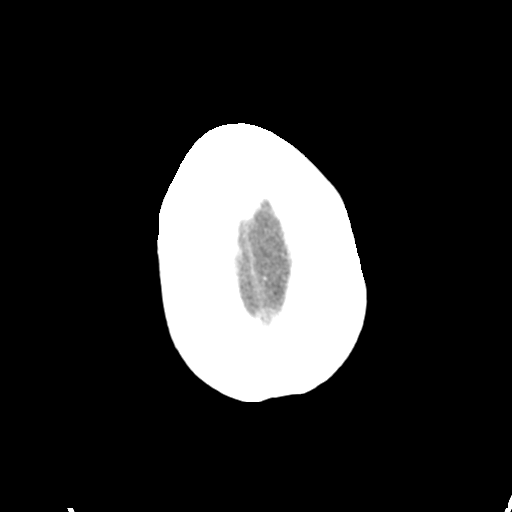

[Series 4: head bone · axial · 0.40mm/px · z∈[-117,-85]mm · 3 of 79 slices shown]
[im 8/79  bone]
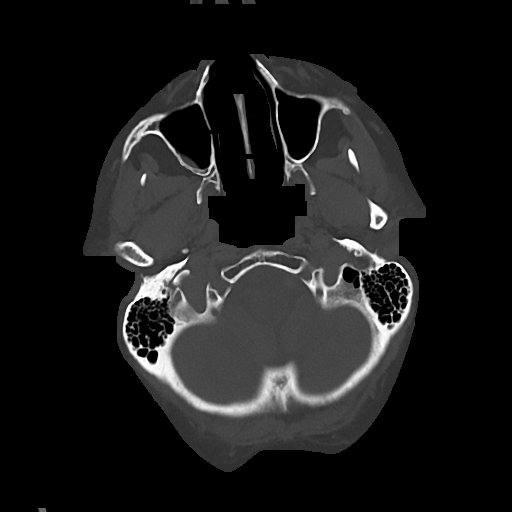
[im 16/79  bone]
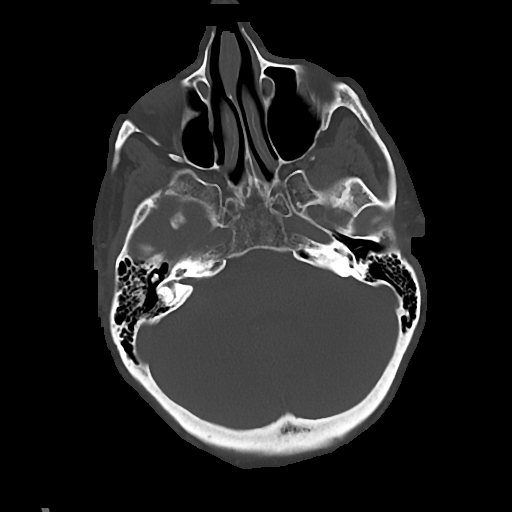
[im 24/79  bone]
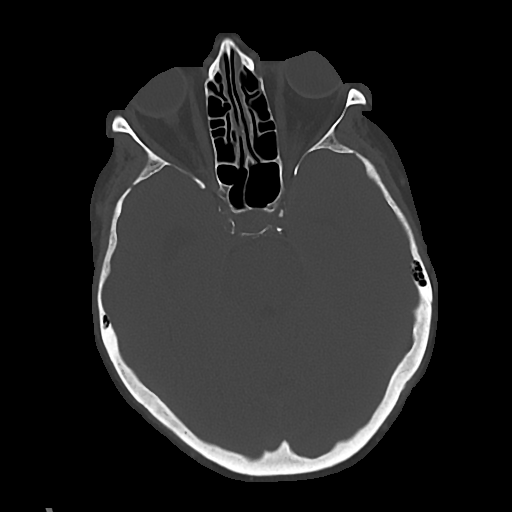

[Series 5: cor soft · coronal · 0.30mm/px · 3 of 67 slices shown]
[im 23/67  brain]
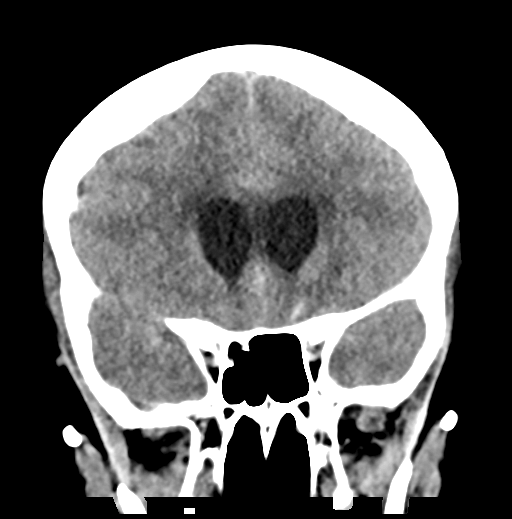
[im 30/67  brain]
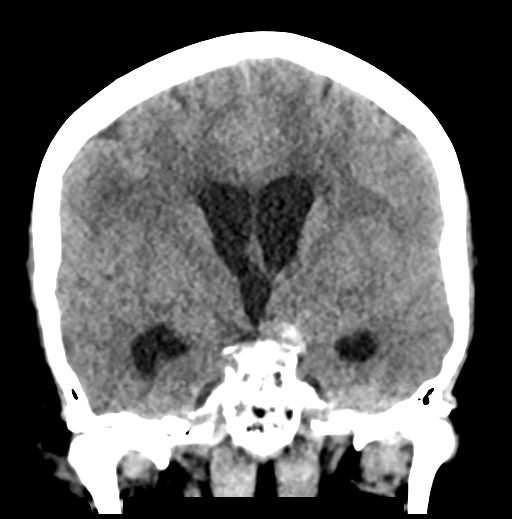
[im 37/67  brain]
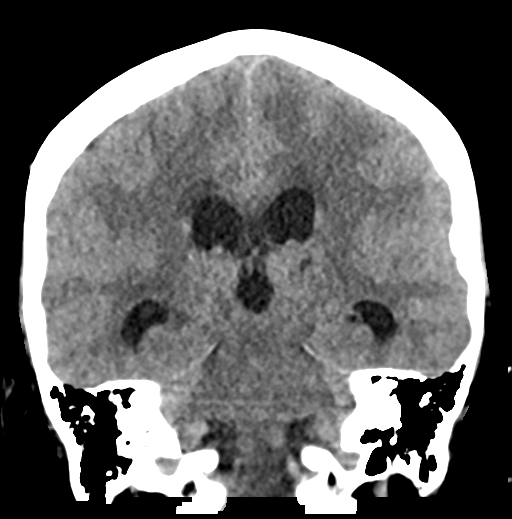

[Series 6: sag soft · sagittal · 0.31mm/px · 3 of 51 slices shown]
[im 17/51  brain]
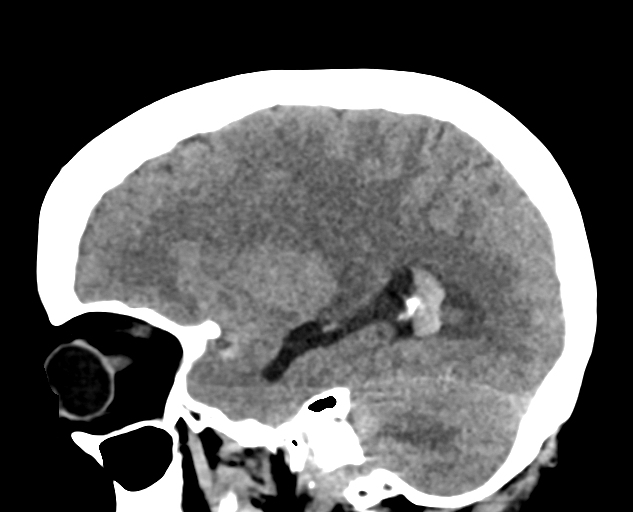
[im 26/51  brain]
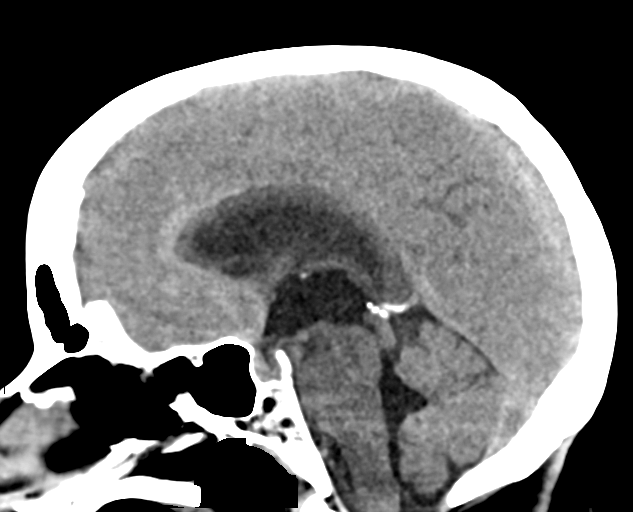
[im 34/51  brain]
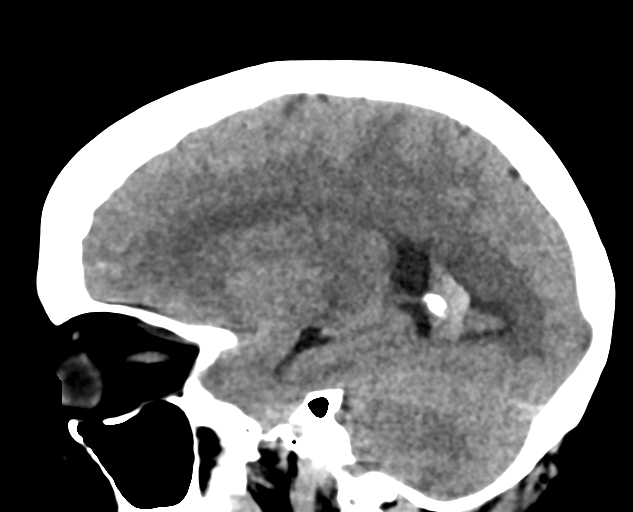

[16 of 47 positions shown; findings below may reference images not displayed]

FINDINGS: Brain: Stable volume of subarachnoid hemorrhage predominantly along
the falx and within the suprasellar cistern. Stable intraventricular
hemorrhage pooling within atria of lateral ventricles. Lateral and
third ventriculomegaly compatible with hydrocephalus is stable to
minimally increased from prior CT of head. No downward herniation.
Stable chronic infarcts in right caudate head and left thalamus.
Stable chronic microvascular ischemic changes and volume loss of
brain. No new acute hemorrhage, stroke, or focal mass effect
identified.

Vascular: Basilar tip aneurysm.

Skull: Normal. Negative for fracture or focal lesion.

Sinuses/Orbits: No acute finding.

Other: None.
IMPRESSION: 1. Stable to minimally increased hydrocephalus with diffuse sulcal
effacement. No downward herniation at this time.
2. Stable volume of subarachnoid hemorrhage predominantly along the
falx and within suprasellar cistern.
3. Stable intraventricular hemorrhage.
4. No new acute stroke or hemorrhage identified.
5. Stable chronic infarcts of basal ganglia.

By: Xio Viton M.D.

## 2020-10-23 ENCOUNTER — Other Ambulatory Visit: Payer: Self-pay | Admitting: Family Medicine

## 2020-10-23 DIAGNOSIS — E2839 Other primary ovarian failure: Secondary | ICD-10-CM

## 2021-06-15 ENCOUNTER — Encounter (HOSPITAL_COMMUNITY): Payer: Self-pay | Admitting: Emergency Medicine

## 2021-06-15 ENCOUNTER — Emergency Department (HOSPITAL_COMMUNITY)
Admission: EM | Admit: 2021-06-15 | Discharge: 2021-06-15 | Disposition: A | Discharge status: Home / Self Care | Payer: 59 | Attending: Emergency Medicine | Admitting: Emergency Medicine

## 2021-06-15 DIAGNOSIS — W260XXA Contact with knife, initial encounter: Secondary | ICD-10-CM | POA: Diagnosis not present

## 2021-06-15 DIAGNOSIS — S91111A Laceration without foreign body of right great toe without damage to nail, initial encounter: Secondary | ICD-10-CM | POA: Insufficient documentation

## 2021-06-15 DIAGNOSIS — S99921A Unspecified injury of right foot, initial encounter: Secondary | ICD-10-CM | POA: Diagnosis present

## 2021-06-15 MED ORDER — LIDOCAINE HCL (PF) 1 % IJ SOLN
5.0000 mL | Freq: Once | INTRAMUSCULAR | Status: AC
  Administered 2021-06-15: 5 mL
  Filled 2021-06-15: qty 30

## 2021-06-15 NOTE — Discharge Instructions (Signed)
I have attached information on laceration care.  Please reference this with any questions.  Please apply triple antibiotic to the wound 1-2 times per day.  Please have your stitches removed in 7 to 10 days. ? ?If you develop any new or worsening symptoms or signs of infection please come back to the emergency department immediately for reevaluation. ?

## 2021-06-15 NOTE — ED Triage Notes (Signed)
Pt arriving with lac to right great toe. Pt states she was using a cutting board and the knife fell and sliced her toe. Bleeding controlled at this time.  ?

## 2021-06-15 NOTE — ED Provider Notes (Signed)
?[facility] DEPT ?Provider Note ? ? ?CSN: [id number] ?Arrival date & time: 06/15/21  1604 ? ?  ? ?History ? ?Chief Complaint  ?Patient presents with  ? Toe Injury  ? ? ?ELISE KNOBLOCH is a 66 y.o. female. ? ?HPI ?Patient is a 66 year old female who presents to the emergency department due to a laceration to the right great toe.  She states that she was cutting food for dinner and the knife slipped and fell to the floor cutting the affected toe.  She reports moderate bleeding initially and wrapped the toe but bleeding has since resolved.  Denies any numbness.  Denies any other regions of trauma/injury.  States her Tdap was updated last year. ?  ? ?Home Medications ?Prior to Admission medications   ?Medication Sig Start Date End Date Taking? Authorizing Provider  ?acetaminophen (TYLENOL) 325 MG tablet Take 1-2 tablets (325-650 mg total) by mouth every 6 (six) hours as needed for mild pain (pain score 1-3 or temp > 100.5). 05/12/18   Barb Merino, MD  ?lisinopril-hydrochlorothiazide (ZESTORETIC) 10-12.5 MG tablet TK 1 T PO QD. STOP PLAIN LISINOPRIL 10 MG TABLETS 06/22/18   [provider]  ?polyethylene glycol (MIRALAX / GLYCOLAX) packet Take 17 g by mouth daily as needed for mild constipation. 05/12/18   Barb Merino, MD  ?   ? ?Allergies    ?Patient has no known allergies.   ? ?Review of Systems   ?Review of Systems  ?Musculoskeletal:  Positive for myalgias.  ?Skin:  Positive for wound.  ?Neurological:  Negative for weakness and numbness.  ? ?Physical Exam ?Updated Vital Signs ?There were no vitals taken for this visit. ?Physical Exam ?Vitals and nursing note reviewed.  ?Constitutional:   ?   General: She is not in acute distress. ?   Appearance: She is well-developed.  ?HENT:  ?   Head: Normocephalic and atraumatic.  ?   Right Ear: External ear normal.  ?   Left Ear: External ear normal.  ?Eyes:  ?   General: No scleral icterus.    ?   Right eye: No discharge.     ?   Left eye:  No discharge.  ?   Conjunctiva/sclera: Conjunctivae normal.  ?Neck:  ?   Trachea: No tracheal deviation.  ?Cardiovascular:  ?   Rate and Rhythm: Normal rate.  ?Pulmonary:  ?   Effort: Pulmonary effort is normal. No respiratory distress.  ?   Breath sounds: No stridor.  ?Abdominal:  ?   General: There is no distension.  ?Musculoskeletal:     ?   General: No swelling or deformity.  ?   Cervical back: Neck supple.  ?Skin: ?   General: Skin is warm and dry.  ?   Findings: No rash.  ?   Comments: 2.5 cm linear well approximated laceration noted along the distal phalanx of the right great toe.  Wound abuts the toenail but does not affect the toenail.  No active bleeding.  Good cap refill.  Distal sensation intact.  ?Neurological:  ?   General: No focal deficit present.  ?   Mental Status: She is alert and oriented to person, place, and time.  ?   Cranial Nerves: Cranial nerve deficit: no gross deficits.  ?Psychiatric:     ?   Mood and Affect: Mood normal.     ?   Behavior: Behavior normal.  ? ?ED Results / Procedures / Treatments   ?Labs ?(all labs ordered are listed, but  only abnormal results are displayed) ?Labs Reviewed - No data to display ? ?EKG ?None ? ?Radiology ?No results found. ? ?Procedures ?Marland Kitchen.Laceration Repair ? ?Date/Time: 06/15/2021 4:54 PM ?Performed by: Rayna Sexton, PA-C ?Authorized by: Rayna Sexton, PA-C  ? ?Consent:  ?  Consent obtained:  Verbal ?  Consent given by:  Patient ?  Risks discussed:  Infection, need for additional repair, pain, poor cosmetic result and poor wound healing ?  Alternatives discussed:  No treatment and delayed treatment ?Universal protocol:  ?  Procedure explained and questions answered to patient or proxy's satisfaction: yes   ?  Relevant documents present and verified: yes   ?  Test results available: yes   ?  Imaging studies available: yes   ?  Required blood products, implants, devices, and special equipment available: yes   ?  Site/side marked: yes   ?  Immediately  prior to procedure, a time out was called: yes   ?  Patient identity confirmed:  Verbally with patient ?Anesthesia:  ?  Anesthesia method:  Local infiltration ?  Local anesthetic:  Lidocaine 1% w/o epi ?Laceration details:  ?  Location: left great toe. ?  Length (cm):  2.5 ?Pre-procedure details:  ?  Preparation:  Patient was prepped and draped in usual sterile fashion ?Exploration:  ?  Limited defect created (wound extended): no   ?  Hemostasis achieved with:  Direct pressure ?  Wound exploration: wound explored through full range of motion   ?  Contaminated: no   ?Treatment:  ?  Area cleansed with:  Shur-Clens and povidone-iodine ?  Amount of cleaning:  Extensive ?Skin repair:  ?  Repair method:  Sutures ?  Suture size:  5-0 ?  Suture material:  Prolene ?  Suture technique:  Simple interrupted ?  Number of sutures:  3 ?Approximation:  ?  Approximation:  Close ?Repair type:  ?  Repair type:  Simple ?Post-procedure details:  ?  Dressing:  Non-adherent dressing ?  Procedure completion:  Tolerated well, no immediate complications  ? ?Medications Ordered in ED ?Medications  ?lidocaine (PF) (XYLOCAINE) 1 % injection 5 mL (5 mLs Infiltration Given 06/15/21 1634)  ? ?ED Course/ Medical Decision Making/ A&P ?  ?                        ?Medical Decision Making ?Risk ?Prescription drug management. ? ?Pt is a 66 y.o. female who presents to the emergency department due to a laceration to the right great toe.  Occurred just prior to arrival when she dropped a knife which cut the affected region.  Initially reports moderate bleeding but this is since resolved.  Denies any numbness or weakness.  Patient states her Tdap is up-to-date. ? ?On my evaluation patient has a 2.5 cm well approximated linear laceration to the dorsum of the distal phalanx of the right great toe.  It abuts the toenail but does not affect the toenail.  No active bleeding.  Neurovascularly intact distal to the wound.  Wound was cleaned extensively and closed  with Prolene sutures.  Please see the procedure note above for additional information. ? ?Nonadherent dressing applied.  Recommended suture removal in 7 to 10 days.  Discussed wound care.  Discussed return precautions.  Patient appears stable for discharge at this time and she is agreeable.  Her questions were answered and she was amicable at the time of discharge. ? ?Note: Portions of this report may have been transcribed using  voice recognition software. Every effort was made to ensure accuracy; however, inadvertent computerized transcription errors may be present.  ? ?Final Clinical Impression(s) / ED Diagnoses ?Final diagnoses:  ?Laceration of right great toe without damage to nail, foreign body presence unspecified, initial encounter  ? ?Rx / DC Orders ?ED Discharge Orders   ? ? None  ? ?  ? ? ?  ?Rayna Sexton, PA-C ?06/15/21 1659 ? ?  ?Isla Pence, MD ?06/15/21 Vernelle Emerald ? ?

## 2021-11-01 ENCOUNTER — Other Ambulatory Visit: Payer: Self-pay | Admitting: Critical Care Medicine

## 2021-11-01 DIAGNOSIS — Z1231 Encounter for screening mammogram for malignant neoplasm of breast: Secondary | ICD-10-CM

## 2021-11-05 ENCOUNTER — Other Ambulatory Visit: Payer: Self-pay | Admitting: Family Medicine

## 2021-11-05 DIAGNOSIS — E2839 Other primary ovarian failure: Secondary | ICD-10-CM

## 2021-11-08 ENCOUNTER — Ambulatory Visit
Admission: RE | Admit: 2021-11-08 | Discharge: 2021-11-08 | Disposition: A | Discharge status: Home / Self Care | Payer: 59 | Source: Ambulatory Visit | Attending: Critical Care Medicine | Admitting: Critical Care Medicine

## 2021-11-08 DIAGNOSIS — Z1231 Encounter for screening mammogram for malignant neoplasm of breast: Secondary | ICD-10-CM

## 2022-04-15 ENCOUNTER — Other Ambulatory Visit: Payer: 59

## 2022-04-26 ENCOUNTER — Other Ambulatory Visit: Payer: 59

## 2022-10-02 ENCOUNTER — Other Ambulatory Visit: Payer: 59

## 2022-10-02 DIAGNOSIS — E2839 Other primary ovarian failure: Secondary | ICD-10-CM

## 2022-12-06 ENCOUNTER — Ambulatory Visit (HOSPITAL_COMMUNITY)
Admission: RE | Admit: 2022-12-06 | Discharge: 2022-12-06 | Disposition: A | Discharge status: Home / Self Care | Payer: 59 | Source: Ambulatory Visit | Attending: Internal Medicine | Admitting: Internal Medicine

## 2022-12-06 ENCOUNTER — Encounter (HOSPITAL_COMMUNITY): Payer: Self-pay

## 2022-12-06 ENCOUNTER — Other Ambulatory Visit (HOSPITAL_COMMUNITY): Payer: Self-pay

## 2022-12-06 VITALS — BP 153/86 | HR 87 | Temp 97.7°F | Resp 18 | Ht 60.0 in

## 2022-12-06 DIAGNOSIS — L723 Sebaceous cyst: Secondary | ICD-10-CM

## 2022-12-06 MED ORDER — SULFAMETHOXAZOLE-TRIMETHOPRIM 800-160 MG PO TABS
1.0000 | ORAL_TABLET | Freq: Two times a day (BID) | ORAL | 0 refills | Status: AC
  Filled 2022-12-06: qty 14, 7d supply, fill #0

## 2022-12-06 NOTE — ED Provider Notes (Signed)
[facility]    CSN: [id number] Arrival date & time: 12/06/22  1224      History   Chief Complaint Chief Complaint  Patient presents with   Abscess   Appointment    HPI Sheryl James is a 67 y.o. female.   The history is provided by the patient.  Abscess Associated symptoms: no fever   Has had a lesion on her back for several years recently developed redness and tenderness just below it.  Denies active drainage, fever, chills, sweats  Past Medical History:  Diagnosis Date   Hypertension    Stroke [facility])     Patient Active Problem List   Diagnosis Date Noted   S/p left hip fracture 09/09/2018   Subarachnoid hemorrhage (HCC) 06/12/2018   Closed left hip fracture, initial encounter ([facility]CC) 05/10/2018   Essential hypertension 03/12/2018   Cognitive deficit, post-stroke    Hypertension    Hypoalbuminemia due to protein-calorie malnutrition (HCC)    Urinary retention    Seizure prophylaxis    SAH (subarachnoid hemorrhage) (HCC) 12/26/2017   Subarachnoid hemorrhage due to ruptured aneurysm (HCC) 11/26/2017    Past Surgical History:  Procedure Laterality Date   HIP ARTHROPLASTY Left 05/10/2018   Procedure: ARTHROPLASTY BIPOLAR HIP (HEMIARTHROPLASTY);  Surgeon: Teryl Lucy, MD;  Location: [facility];  Service: Orthopedics;  Laterality: Left;   IR ANGIO INTRA EXTRACRAN SEL INTERNAL CAROTID BILAT MOD SED  11/27/2017   IR ANGIO VERTEBRAL SEL VERTEBRAL UNI L MOD SED  11/27/2017   IR ANGIOGRAM FOLLOW UP STUDY  11/27/2017   IR ANGIOGRAM FOLLOW UP STUDY  11/27/2017   IR ANGIOGRAM FOLLOW UP STUDY  11/27/2017   IR ANGIOGRAM FOLLOW UP STUDY  11/27/2017   IR TRANSCATH/EMBOLIZ  11/27/2017   IR US GUIDE VASC ACCESS RIGHT  11/27/2017   RADIOLOGY WITH ANESTHESIA N/A 11/27/2017   Procedure: IR WITH ANESTHESIA;  Surgeon: Lisbeth Renshaw, MD;  Location: [facility];  Service: Radiology;  Laterality: N/A;    OB History   No obstetric history on file.      Home Medications    Prior  to Admission medications   Medication Sig Start Date End Date Taking? Authorizing Provider  hydrochlorothiazide (HYDRODIURIL) 12.5 MG tablet Take 12.5 mg by mouth daily. 11/19/22  Yes [provider]  lisinopril (ZESTRIL) 20 MG tablet Take 20 mg by mouth daily. 11/19/22  Yes [provider]  sulfamethoxazole-trimethoprim (BACTRIM DS) 800-160 MG tablet Take 1 tablet by mouth 2 (two) times daily for 7 days. 12/06/22 12/13/22 Yes Meliton Rattan, PA  acetaminophen (TYLENOL) 325 MG tablet Take 1-2 tablets (325-650 mg total) by mouth every 6 (six) hours as needed for mild pain (pain score 1-3 or temp > 100.5). 05/12/18   Dorcas Carrow, MD  polyethylene glycol (MIRALAX / GLYCOLAX) packet Take 17 g by mouth daily as needed for mild constipation. 05/12/18   Dorcas Carrow, MD    Family History Family History  Problem Relation Age of Onset   Breast cancer Neg Hx     Social History Social History   Tobacco Use   Smoking status: Former    Current packs/day: 0.50    Types: Cigarettes   Smokeless tobacco: Never  Vaping Use   Vaping status: Never Used  Substance Use Topics   Alcohol use: Never   Drug use: Never     Allergies   Patient has no known allergies.   Review of Systems Review of Systems  Constitutional:  Negative for chills and fever.  Skin:  Positive for color change.     Physical Exam Triage Vital Signs ED Triage Vitals  Encounter Vitals Group     BP 12/06/22 1245 (!) 153/86     Systolic BP Percentile --      Diastolic BP Percentile --      Pulse Rate 12/06/22 1245 87     Resp 12/06/22 1245 18     Temp 12/06/22 1245 97.7 F (36.5 C)     Temp Source 12/06/22 1245 Oral     SpO2 12/06/22 1245 97 %     Weight --      Height 12/06/22 1245 5' (1.524 m)     Head Circumference --      Peak Flow --      Pain Score 12/06/22 1243 0     Pain Loc --      Pain Education --      Exclude from Growth Chart --    No data found.  Updated Vital Signs BP (!)  153/86 (BP Location: Right Arm)   Pulse 87   Temp 97.7 F (36.5 C) (Oral)   Resp 18   Ht 5' (1.524 m)   SpO2 97%   BMI 29.49 kg/m   Visual Acuity Right Eye Distance:   Left Eye Distance:   Bilateral Distance:    Right Eye Near:   Left Eye Near:    Bilateral Near:     Physical Exam Vitals and nursing note reviewed.  Skin:    Comments: 8 mm brown lesion on back, 1 cm erythema and tenderness below.  Neurological:     Mental Status: She is alert.      UC Treatments / Results  Labs (all labs ordered are listed, but only abnormal results are displayed) Labs Reviewed - No data to display  EKG   Radiology No results found.  Procedures Procedures (including critical care time)  Medications Ordered in UC Medications - No data to display  Initial Impression / Assessment and Plan / UC Course  I have reviewed the triage vital signs and the nursing notes.  Pertinent labs & imaging results that were available during my care of the patient were reviewed by me and considered in my medical decision making (see chart for details).    67 year old with lesion on back for many years now has redness below it, on exam was noted to be epidermal inclusion cyst with large plug of the patient's material which was extracted with gentle pressure.  Wound cleaned Band-Aid applied Will treat with antibiotics given area of redness below, home care and follow-up reviewed with patient Final Clinical Impressions(s) / UC Diagnoses   Final diagnoses:  Sebaceous cyst     Discharge Instructions      See a dermatologist if the lesion recurs.   ED Prescriptions     Medication Sig Dispense Auth. Provider   sulfamethoxazole-trimethoprim (BACTRIM DS) 800-160 MG tablet Take 1 tablet by mouth 2 (two) times daily for 7 days. 14 tablet Meliton Rattan, [place]      PDMP not reviewed this encounter.   Meliton Rattan, [place] 12/06/22 1318

## 2022-12-06 NOTE — ED Triage Notes (Signed)
"  Boil or something on my back. - Entered by patient" onset 2 weeks ago. On the mid left side of the back. Patient gets boils every so often that resolves with warm compresses. Not going away this time.

## 2022-12-06 NOTE — Discharge Instructions (Addendum)
See a dermatologist if the lesion recurs.

## 2023-10-13 ENCOUNTER — Other Ambulatory Visit: Payer: Self-pay

## 2023-10-13 ENCOUNTER — Encounter (HOSPITAL_COMMUNITY): Payer: Self-pay | Admitting: Emergency Medicine

## 2023-10-13 ENCOUNTER — Ambulatory Visit (HOSPITAL_COMMUNITY)
Admission: EM | Admit: 2023-10-13 | Discharge: 2023-10-13 | Disposition: A | Discharge status: Home / Self Care | Attending: Emergency Medicine | Admitting: Emergency Medicine

## 2023-10-13 DIAGNOSIS — R21 Rash and other nonspecific skin eruption: Secondary | ICD-10-CM | POA: Diagnosis not present

## 2023-10-13 DIAGNOSIS — L2089 Other atopic dermatitis: Secondary | ICD-10-CM | POA: Diagnosis not present

## 2023-10-13 NOTE — ED Provider Notes (Signed)
 [facility]    CSN: [id number] Arrival date & time: 10/13/23  1051     History   Chief Complaint Chief Complaint  Patient presents with   Rash    HPI Sheryl James is a 68 y.o. female.  1 week of rash on the right forehead, around right eye, and on left chin. Itching. Not painful or swollen Denies any changes in products, medications, other exposures, etc  Has used polysporin topically  Not having any eye pain, vision changes, drainage from eye  Past Medical History:  Diagnosis Date   Hypertension    Stroke [facility])     Patient Active Problem List   Diagnosis Date Noted   S/p left hip fracture 09/09/2018   Subarachnoid hemorrhage (HCC) 06/12/2018   Closed left hip fracture, initial encounter (HCC) 05/10/2018   Essential hypertension 03/12/2018   Cognitive deficit, post-stroke    Hypertension    Hypoalbuminemia due to protein-calorie malnutrition (HCC)    Urinary retention    Seizure prophylaxis    SAH (subarachnoid hemorrhage) (HCC) 12/26/2017   Subarachnoid hemorrhage due to ruptured aneurysm (HCC) 11/26/2017    Past Surgical History:  Procedure Laterality Date   HIP ARTHROPLASTY Left 05/10/2018   Procedure: ARTHROPLASTY BIPOLAR HIP (HEMIARTHROPLASTY);  Surgeon: Josefina Chew, MD;  Location: [facility];  Service: Orthopedics;  Laterality: Left;   IR ANGIO INTRA EXTRACRAN SEL INTERNAL CAROTID BILAT MOD SED  11/27/2017   IR ANGIO VERTEBRAL SEL VERTEBRAL UNI L MOD SED  11/27/2017   IR ANGIOGRAM FOLLOW UP STUDY  11/27/2017   IR ANGIOGRAM FOLLOW UP STUDY  11/27/2017   IR ANGIOGRAM FOLLOW UP STUDY  11/27/2017   IR ANGIOGRAM FOLLOW UP STUDY  11/27/2017   IR TRANSCATH/EMBOLIZ  11/27/2017   IR US  GUIDE VASC ACCESS RIGHT  11/27/2017   RADIOLOGY WITH ANESTHESIA N/A 11/27/2017   Procedure: IR WITH ANESTHESIA;  Surgeon: Lanis Pupa, MD;  Location: [facility] OR;  Service: Radiology;  Laterality: N/A;    OB History   No obstetric history on file.      Home  Medications    Prior to Admission medications   Medication Sig Start Date End Date Taking? Authorizing Provider  acetaminophen  (TYLENOL ) 325 MG tablet Take 1-2 tablets (325-650 mg total) by mouth every 6 (six) hours as needed for mild pain (pain score 1-3 or temp > 100.5). 05/12/18   Ghimire, Kuber, MD  hydrochlorothiazide (HYDRODIURIL) 12.5 MG tablet Take 12.5 mg by mouth daily. 11/19/22   [provider]  lisinopril  (ZESTRIL ) 20 MG tablet Take 20 mg by mouth daily. 11/19/22   [provider]  polyethylene glycol (MIRALAX  / GLYCOLAX ) packet Take 17 g by mouth daily as needed for mild constipation. 05/12/18   Raenelle Coria, MD    Family History Family History  Problem Relation Age of Onset   Breast cancer Neg Hx     Social History Social History   Tobacco Use   Smoking status: Former    Current packs/day: 0.50    Types: Cigarettes   Smokeless tobacco: Never  Vaping Use   Vaping status: Never Used  Substance Use Topics   Alcohol use: Never   Drug use: Never     Allergies   Patient has no known allergies.   Review of Systems Review of Systems As per HPI  Physical Exam Triage Vital Signs ED Triage Vitals  Encounter Vitals Group     BP 10/13/23 1202 134/66     Girls Systolic BP Percentile --  Girls Diastolic BP Percentile --      Boys Systolic BP Percentile --      Boys Diastolic BP Percentile --      Pulse Rate 10/13/23 1202 (!) 58     Resp 10/13/23 1202 16     Temp 10/13/23 1202 98 F (36.7 C)     Temp Source 10/13/23 1202 Oral     SpO2 10/13/23 1202 98 %     Weight --      Height --      Head Circumference --      Peak Flow --      Pain Score 10/13/23 1200 0     Pain Loc --      Pain Education --      Exclude from Growth Chart --    No data found.  Updated Vital Signs BP 134/66 (BP Location: Right Arm)   Pulse (!) 58   Temp 98 F (36.7 C) (Oral)   Resp 16   SpO2 98%   Visual Acuity Right Eye Distance: 20/30 Left Eye  Distance: 20/30 Bilateral Distance: 20/30  Right Eye Near: R Near: 20/30 Left Eye Near:  L Near: 20/30 Bilateral Near:  20/30  Physical Exam Vitals and nursing note reviewed.  Constitutional:      General: She is not in acute distress.    Appearance: Normal appearance.  HENT:     Head:      Comments: One spot of dry skin rash on the right forehead, one spot around the right lateral eye, and one spot on the left chin.    Mouth/Throat:     Mouth: Mucous membranes are moist.     Pharynx: Oropharynx is clear. No posterior oropharyngeal erythema.  Eyes:     Conjunctiva/sclera: Conjunctivae normal.     Pupils: Pupils are equal, round, and reactive to light.  Cardiovascular:     Rate and Rhythm: Normal rate and regular rhythm.     Pulses: Normal pulses.     Heart sounds: Normal heart sounds.  Pulmonary:     Effort: Pulmonary effort is normal. No respiratory distress.     Breath sounds: Normal breath sounds. No wheezing.  Abdominal:     General: Bowel sounds are normal.     Tenderness: There is no abdominal tenderness.  Musculoskeletal:        General: Normal range of motion.     Cervical back: Normal range of motion.  Skin:    Findings: Rash present.  Neurological:     Mental Status: She is alert and oriented to person, place, and time.     UC Treatments / Results  Labs (all labs ordered are listed, but only abnormal results are displayed) Labs Reviewed - No data to display  EKG  Radiology No results found.  Procedures Procedures (including critical care time)  Medications Ordered in UC Medications - No data to display  Initial Impression / Assessment and Plan / UC Course  I have reviewed the triage vital signs and the nursing notes.  Pertinent labs & imaging results that were available during my care of the patient were reviewed by me and considered in my medical decision making (see chart for details).  Rash. Dermatitis, possibly atopic Does not appear  fungal. Not bacterial.  Recommend trial of hydrocortisone , applied only once daily for about 5 days. Moisturize daily. Can try night time zyrtec dose for itching. Advised return or follow with primary care. Patient agrees to plan, no questions  Final Clinical Impressions(s) / UC Diagnoses   Final diagnoses:  Other atopic dermatitis  Facial rash     Discharge Instructions      Try once nightly zyrtec 10 mg. Use for about 2 weeks to help with itch  Topical hydrocortisone  -- apply once daily for about 5 days in a row. You can moisturize daily to reduce itching and dry skin  Please follow with your primary care provider if needed     ED Prescriptions   None    PDMP not reviewed this encounter.   Jeryl Stabs, [place] 10/13/23 1411

## 2023-10-13 NOTE — Discharge Instructions (Signed)
 Try once nightly zyrtec 10 mg. Use for about 2 weeks to help with itch  Topical hydrocortisone  -- apply once daily for about 5 days in a row. You can moisturize daily to reduce itching and dry skin  Please follow with your primary care provider if needed

## 2023-10-13 NOTE — ED Triage Notes (Signed)
 Patient has a right red eye, right eyelid is red and swollen.  Symptoms started sometime last week.  Has used polysporin.  Eye itches.

## 2023-12-23 DIAGNOSIS — L719 Rosacea, unspecified: Secondary | ICD-10-CM | POA: Diagnosis not present

## 2023-12-23 DIAGNOSIS — L219 Seborrheic dermatitis, unspecified: Secondary | ICD-10-CM | POA: Diagnosis not present

## 2024-01-05 DIAGNOSIS — Z0000 Encounter for general adult medical examination without abnormal findings: Secondary | ICD-10-CM | POA: Diagnosis not present

## 2024-01-05 DIAGNOSIS — Z79899 Other long term (current) drug therapy: Secondary | ICD-10-CM | POA: Diagnosis not present

## 2024-01-05 DIAGNOSIS — Z1211 Encounter for screening for malignant neoplasm of colon: Secondary | ICD-10-CM | POA: Diagnosis not present

## 2024-01-05 DIAGNOSIS — M810 Age-related osteoporosis without current pathological fracture: Secondary | ICD-10-CM | POA: Diagnosis not present

## 2024-01-05 DIAGNOSIS — Z8679 Personal history of other diseases of the circulatory system: Secondary | ICD-10-CM | POA: Diagnosis not present

## 2024-01-05 DIAGNOSIS — I10 Essential (primary) hypertension: Secondary | ICD-10-CM | POA: Diagnosis not present

## 2024-01-05 DIAGNOSIS — R7301 Impaired fasting glucose: Secondary | ICD-10-CM | POA: Diagnosis not present

## 2024-01-05 DIAGNOSIS — E039 Hypothyroidism, unspecified: Secondary | ICD-10-CM | POA: Diagnosis not present

## 2024-01-16 DIAGNOSIS — H26491 Other secondary cataract, right eye: Secondary | ICD-10-CM | POA: Diagnosis not present

## 2024-01-16 DIAGNOSIS — H52223 Regular astigmatism, bilateral: Secondary | ICD-10-CM | POA: Diagnosis not present

## 2024-01-16 DIAGNOSIS — Z961 Presence of intraocular lens: Secondary | ICD-10-CM | POA: Diagnosis not present

## 2024-01-16 DIAGNOSIS — H524 Presbyopia: Secondary | ICD-10-CM | POA: Diagnosis not present

## 2024-01-16 DIAGNOSIS — H40013 Open angle with borderline findings, low risk, bilateral: Secondary | ICD-10-CM | POA: Diagnosis not present
